# Patient Record
Sex: Female | Born: 1985 | Race: White | Hispanic: No | Marital: Married | Smoking: Former smoker
Health system: Southern US, Community
[De-identification: ages and names within clinical notes are randomized; demographics above are authoritative.]

## PROBLEM LIST (undated history)

## (undated) ENCOUNTER — Ambulatory Visit: Admission: EM | Payer: Managed Care, Other (non HMO) | Source: Home / Self Care

## (undated) ENCOUNTER — Inpatient Hospital Stay (HOSPITAL_COMMUNITY): Payer: Self-pay

## (undated) DIAGNOSIS — K589 Irritable bowel syndrome without diarrhea: Secondary | ICD-10-CM

## (undated) DIAGNOSIS — Z8659 Personal history of other mental and behavioral disorders: Secondary | ICD-10-CM

## (undated) DIAGNOSIS — Z8619 Personal history of other infectious and parasitic diseases: Secondary | ICD-10-CM

## (undated) DIAGNOSIS — D6800 Von Willebrand disease, unspecified: Secondary | ICD-10-CM

## (undated) DIAGNOSIS — L309 Dermatitis, unspecified: Secondary | ICD-10-CM

## (undated) DIAGNOSIS — F329 Major depressive disorder, single episode, unspecified: Secondary | ICD-10-CM

## (undated) DIAGNOSIS — J45909 Unspecified asthma, uncomplicated: Secondary | ICD-10-CM

## (undated) DIAGNOSIS — F32A Depression, unspecified: Secondary | ICD-10-CM

## (undated) DIAGNOSIS — F419 Anxiety disorder, unspecified: Secondary | ICD-10-CM

## (undated) DIAGNOSIS — M419 Scoliosis, unspecified: Secondary | ICD-10-CM

## (undated) DIAGNOSIS — O0990 Supervision of high risk pregnancy, unspecified, unspecified trimester: Secondary | ICD-10-CM

## (undated) HISTORY — DX: Dermatitis, unspecified: L30.9

## (undated) HISTORY — DX: Anxiety disorder, unspecified: F41.9

## (undated) HISTORY — PX: WISDOM TOOTH EXTRACTION: SHX21

## (undated) HISTORY — DX: Supervision of high risk pregnancy, unspecified, unspecified trimester: O09.90

## (undated) HISTORY — PX: UPPER GASTROINTESTINAL ENDOSCOPY: SHX188

## (undated) HISTORY — DX: Personal history of other mental and behavioral disorders: Z86.59

## (undated) HISTORY — DX: Irritable bowel syndrome, unspecified: K58.9

## (undated) HISTORY — DX: Personal history of other infectious and parasitic diseases: Z86.19

## (undated) HISTORY — DX: Major depressive disorder, single episode, unspecified: F32.9

## (undated) HISTORY — PX: COLONOSCOPY: SHX174

## (undated) HISTORY — DX: Depression, unspecified: F32.A

## (undated) HISTORY — DX: Scoliosis, unspecified: M41.9

---
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
## 2006-07-28 ENCOUNTER — Encounter: Admission: RE | Admit: 2006-07-28 | Discharge: 2006-07-28 | Payer: Self-pay | Admitting: Obstetrics and Gynecology

## 2006-12-18 ENCOUNTER — Emergency Department (HOSPITAL_COMMUNITY): Admission: EM | Admit: 2006-12-18 | Discharge: 2006-12-18 | Payer: Self-pay | Admitting: Emergency Medicine

## 2010-01-29 ENCOUNTER — Ambulatory Visit: Payer: Self-pay | Admitting: Hematology and Oncology

## 2010-02-03 LAB — CBC WITH DIFFERENTIAL/PLATELET
BASO%: 0.4 % (ref 0.0–2.0)
EOS%: 1.8 % (ref 0.0–7.0)
HCT: 39.5 % (ref 34.8–46.6)
MCHC: 34.2 g/dL (ref 31.5–36.0)
MONO#: 0.5 10*3/uL (ref 0.1–0.9)
RBC: 4.57 10*6/uL (ref 3.70–5.45)
RDW: 15.2 % — ABNORMAL HIGH (ref 11.2–14.5)
WBC: 8.1 10*3/uL (ref 3.9–10.3)
lymph#: 2.1 10*3/uL (ref 0.9–3.3)

## 2010-02-03 LAB — PROTIME-INR
INR: 1 — ABNORMAL LOW (ref 2.00–3.50)
Protime: 12 Seconds (ref 10.6–13.4)

## 2010-02-03 LAB — IVY BLEEDING TIME: Bleeding Time: 6.5 Minutes (ref 2.0–8.0)

## 2010-02-10 LAB — COMPREHENSIVE METABOLIC PANEL
AST: 14 U/L (ref 0–37)
Alkaline Phosphatase: 42 U/L (ref 39–117)
BUN: 10 mg/dL (ref 6–23)
Creatinine, Ser: 0.41 mg/dL (ref 0.40–1.20)
Glucose, Bld: 72 mg/dL (ref 70–99)

## 2010-02-10 LAB — VON WILLEBRAND FACTOR MULTIMER
Factor-VIII Activity: 82 % (ref 50–180)
Ristocetin Co-Factor: 91 % (ref 42–200)
Von Willebrand Factor Ag: 100 % (ref 50–217)

## 2010-05-28 ENCOUNTER — Ambulatory Visit: Payer: Self-pay | Admitting: Hematology and Oncology

## 2010-06-03 LAB — VON WILLEBRAND PANEL
Factor-VIII Activity: 58 % (ref 50–150)
Ristocetin-Cofactor: 21 % — ABNORMAL LOW (ref 50–150)
Von Willebrand Ag: 49 % normal — ABNORMAL LOW (ref 61–164)

## 2010-07-28 ENCOUNTER — Ambulatory Visit: Payer: Self-pay | Admitting: Hematology and Oncology

## 2010-07-30 LAB — CBC WITH DIFFERENTIAL/PLATELET
BASO%: 0.6 % (ref 0.0–2.0)
EOS%: 1.1 % (ref 0.0–7.0)
LYMPH%: 23.2 % (ref 14.0–49.7)
MCH: 31.8 pg (ref 25.1–34.0)
MCHC: 35.1 g/dL (ref 31.5–36.0)
MONO#: 0.6 10*3/uL (ref 0.1–0.9)
Platelets: 230 10*3/uL (ref 145–400)
RBC: 4.09 10*6/uL (ref 3.70–5.45)
WBC: 9.1 10*3/uL (ref 3.9–10.3)
lymph#: 2.1 10*3/uL (ref 0.9–3.3)

## 2010-08-04 LAB — VON WILLEBRAND FACTOR MULTIMER: Factor-VIII Activity: 131 % (ref 50–180)

## 2010-08-14 LAB — VON WILLEBRAND PANEL: Ristocetin-Cofactor: 124 % (ref 50–150)

## 2010-08-19 ENCOUNTER — Inpatient Hospital Stay (HOSPITAL_COMMUNITY): Admission: RE | Admit: 2010-08-19 | Discharge: 2010-08-21 | Payer: Self-pay | Admitting: Obstetrics & Gynecology

## 2010-11-17 ENCOUNTER — Ambulatory Visit: Payer: Self-pay | Admitting: Hematology and Oncology

## 2011-03-04 LAB — CBC
HCT: 36.7 % (ref 36.0–46.0)
MCHC: 34.6 g/dL (ref 30.0–36.0)
MCV: 94 fL (ref 78.0–100.0)
Platelets: 191 10*3/uL (ref 150–400)
RDW: 13.5 % (ref 11.5–15.5)
WBC: 13.4 10*3/uL — ABNORMAL HIGH (ref 4.0–10.5)

## 2011-03-05 LAB — TYPE AND SCREEN

## 2011-03-05 LAB — ABO/RH: ABO/RH(D): O POS

## 2011-03-05 LAB — RPR: RPR Ser Ql: NONREACTIVE

## 2011-03-05 LAB — CBC
Hemoglobin: 13.4 g/dL (ref 12.0–15.0)
MCH: 32.1 pg (ref 26.0–34.0)
MCHC: 34.2 g/dL (ref 30.0–36.0)
Platelets: 235 10*3/uL (ref 150–400)
RBC: 4.18 MIL/uL (ref 3.87–5.11)

## 2012-08-30 ENCOUNTER — Encounter: Payer: Self-pay | Admitting: Hematology and Oncology

## 2012-08-30 NOTE — Progress Notes (Signed)
Faxed clinical information to [employer] for patient's life insurance policy attnMariel Craft @ [phone].

## 2015-01-21 LAB — HM PAP SMEAR

## 2015-07-02 LAB — OB RESULTS CONSOLE GC/CHLAMYDIA
Chlamydia: NEGATIVE
Gonorrhea: NEGATIVE

## 2015-07-02 LAB — OB RESULTS CONSOLE ABO/RH: RH TYPE: POSITIVE

## 2015-07-02 LAB — OB RESULTS CONSOLE RUBELLA ANTIBODY, IGM: Rubella: IMMUNE

## 2015-07-02 LAB — OB RESULTS CONSOLE HEPATITIS B SURFACE ANTIGEN: HEP B S AG: NEGATIVE

## 2015-07-02 LAB — OB RESULTS CONSOLE RPR: RPR: NONREACTIVE

## 2015-07-02 LAB — OB RESULTS CONSOLE HIV ANTIBODY (ROUTINE TESTING): HIV: NONREACTIVE

## 2015-07-11 ENCOUNTER — Inpatient Hospital Stay (HOSPITAL_COMMUNITY)
Admission: AD | Admit: 2015-07-11 | Discharge: 2015-07-12 | Disposition: A | Discharge status: Home / Self Care | Payer: BLUE CROSS/BLUE SHIELD | Source: Ambulatory Visit | Attending: Obstetrics and Gynecology | Admitting: Obstetrics and Gynecology

## 2015-07-11 DIAGNOSIS — D6801 Von willebrand disease, type 1: Secondary | ICD-10-CM | POA: Diagnosis present

## 2015-07-11 DIAGNOSIS — Z3A01 Less than 8 weeks gestation of pregnancy: Secondary | ICD-10-CM | POA: Insufficient documentation

## 2015-07-11 DIAGNOSIS — J45909 Unspecified asthma, uncomplicated: Secondary | ICD-10-CM

## 2015-07-11 DIAGNOSIS — Z886 Allergy status to analgesic agent status: Secondary | ICD-10-CM | POA: Insufficient documentation

## 2015-07-11 DIAGNOSIS — O26899 Other specified pregnancy related conditions, unspecified trimester: Secondary | ICD-10-CM

## 2015-07-11 DIAGNOSIS — O26851 Spotting complicating pregnancy, first trimester: Secondary | ICD-10-CM

## 2015-07-11 DIAGNOSIS — Z889 Allergy status to unspecified drugs, medicaments and biological substances status: Secondary | ICD-10-CM

## 2015-07-11 DIAGNOSIS — O99111 Other diseases of the blood and blood-forming organs and certain disorders involving the immune mechanism complicating pregnancy, first trimester: Secondary | ICD-10-CM | POA: Insufficient documentation

## 2015-07-11 DIAGNOSIS — R109 Unspecified abdominal pain: Secondary | ICD-10-CM

## 2015-07-11 DIAGNOSIS — Z87891 Personal history of nicotine dependence: Secondary | ICD-10-CM | POA: Insufficient documentation

## 2015-07-11 DIAGNOSIS — D680 Von Willebrand's disease: Secondary | ICD-10-CM | POA: Insufficient documentation

## 2015-07-11 DIAGNOSIS — Z888 Allergy status to other drugs, medicaments and biological substances status: Secondary | ICD-10-CM

## 2015-07-11 DIAGNOSIS — O209 Hemorrhage in early pregnancy, unspecified: Secondary | ICD-10-CM

## 2015-07-11 HISTORY — DX: Von Willebrand disease, unspecified: D68.00

## 2015-07-11 HISTORY — DX: Unspecified asthma, uncomplicated: J45.909

## 2015-07-11 HISTORY — DX: Von Willebrand's disease: D68.0

## 2015-07-12 ENCOUNTER — Inpatient Hospital Stay (HOSPITAL_COMMUNITY): Payer: BLUE CROSS/BLUE SHIELD

## 2015-07-12 ENCOUNTER — Encounter (HOSPITAL_COMMUNITY): Payer: Self-pay | Admitting: *Deleted

## 2015-07-12 DIAGNOSIS — Z886 Allergy status to analgesic agent status: Secondary | ICD-10-CM | POA: Diagnosis not present

## 2015-07-12 DIAGNOSIS — O99111 Other diseases of the blood and blood-forming organs and certain disorders involving the immune mechanism complicating pregnancy, first trimester: Secondary | ICD-10-CM | POA: Diagnosis not present

## 2015-07-12 DIAGNOSIS — O26851 Spotting complicating pregnancy, first trimester: Secondary | ICD-10-CM | POA: Diagnosis not present

## 2015-07-12 DIAGNOSIS — Z3A01 Less than 8 weeks gestation of pregnancy: Secondary | ICD-10-CM | POA: Diagnosis not present

## 2015-07-12 DIAGNOSIS — Z87891 Personal history of nicotine dependence: Secondary | ICD-10-CM | POA: Diagnosis not present

## 2015-07-12 DIAGNOSIS — D680 Von Willebrand's disease: Secondary | ICD-10-CM | POA: Diagnosis not present

## 2015-07-12 LAB — WET PREP, GENITAL
Trich, Wet Prep: NONE SEEN
Yeast Wet Prep HPF POC: NONE SEEN

## 2015-07-12 LAB — URINALYSIS, ROUTINE W REFLEX MICROSCOPIC
BILIRUBIN URINE: NEGATIVE
GLUCOSE, UA: NEGATIVE mg/dL
KETONES UR: NEGATIVE mg/dL
LEUKOCYTES UA: NEGATIVE
Nitrite: NEGATIVE
PH: 5.5 (ref 5.0–8.0)
PROTEIN: NEGATIVE mg/dL
Specific Gravity, Urine: >1.03 — ABNORMAL HIGH (ref 1.005–1.030)
Urobilinogen, UA: 0.2 mg/dL (ref 0.0–1.0)

## 2015-07-12 LAB — URINE MICROSCOPIC-ADD ON

## 2015-07-12 NOTE — MAU Provider Note (Signed)
History  29 yo G2P1001 @ 6.4 wks by LMP congruent w/ sono yesterday presents to MAU w/ c/o pinkish spotting in toilet and when wiping. +Cramping. H/O Von Willebrand disease.  Patient Active Problem List   Diagnosis Date Noted  . Spotting affecting pregnancy in first trimester 07/11/2015  . Cramping affecting pregnancy, antepartum 07/11/2015  . Von Willebrand disease 07/11/2015  . Aspirin allergy 07/11/2015    Chief Complaint  Patient presents with  . Vaginal Bleeding  . Abdominal Cramping   HPI As above OB History    Gravida Para Term Preterm AB TAB SAB Ectopic Multiple Living   2 1 1       1       Past Medical History  Diagnosis Date  . Von Willebrand disease   . Asthma     Past Surgical History  Procedure Laterality Date  . Wisdom tooth extraction      No family history on file.  History  Substance Use Topics  . Smoking status: Former Smoker    Quit date: 05/12/2015  . Smokeless tobacco: Not on file  . Alcohol Use: No     Comment: not while pregnant    Allergies: Allergies not on file  No prescriptions prior to admission    ROS  Spotting Cramping Physical Exam   Blood pressure 110/60, pulse 70, temperature 98.2 F (36.8 C), resp. rate 18, height 5\' 7"  (1.702 m), weight 60.238 kg (132 lb 12.8 oz), last menstrual period 05/22/2015.  Recent Results (from the past 2160 hour(s))  GC/Chlamydia probe amp ([place])not at [facility]     Status: None   Collection Time: 07/12/15 12:00 AM  Result Value Ref Range   Chlamydia Negative     Comment: Normal Reference Range - Negative   Neisseria gonorrhea Negative     Comment: Normal Reference Range - Negative  Urinalysis, Routine w reflex microscopic (not at [facility])     Status: Abnormal   Collection Time: 07/12/15 12:45 AM  Result Value Ref Range   Color, Urine YELLOW YELLOW   APPearance CLEAR CLEAR   Specific Gravity, Urine >1.030 (H) 1.005 - 1.030   pH 5.5 5.0 - 8.0   Glucose, UA NEGATIVE NEGATIVE mg/dL   Hgb urine dipstick LARGE (A) NEGATIVE   Bilirubin Urine NEGATIVE NEGATIVE   Ketones, ur NEGATIVE NEGATIVE mg/dL   Protein, ur NEGATIVE NEGATIVE mg/dL   Urobilinogen, UA 0.2 0.0 - 1.0 mg/dL   Nitrite NEGATIVE NEGATIVE   Leukocytes, UA NEGATIVE NEGATIVE  Urine microscopic-add on     Status: None   Collection Time: 07/12/15 12:45 AM  Result Value Ref Range   Squamous Epithelial / LPF RARE RARE   WBC, UA 0-2 <3 WBC/hpf   RBC / HPF 3-6 <3 RBC/hpf   Bacteria, UA RARE RARE   Urine-Other MUCOUS PRESENT   Wet prep, genital     Status: Abnormal   Collection Time: 07/12/15  2:43 AM  Result Value Ref Range   Yeast Wet Prep HPF POC NONE SEEN NONE SEEN   Trich, Wet Prep NONE SEEN NONE SEEN   Clue Cells Wet Prep HPF POC RARE (A) NONE SEEN   WBC, Wet Prep HPF POC MODERATE (A) NONE SEEN    Comment: MODERATE BACTERIA SEEN  CLINICAL DATA: 29 year old pregnant female with vaginal bleeding  EXAM: OBSTETRIC <14 WK [place] AND TRANSVAGINAL OB US  TECHNIQUE: Both transabdominal and transvaginal ultrasound examinations were performed for complete evaluation of the gestation as well as the maternal uterus, adnexal  regions, and pelvic cul-de-sac. Transvaginal technique was performed to assess early pregnancy.  COMPARISON: None.  FINDINGS: Intrauterine gestational sac: Present  Yolk sac: Seen  Embryo: Seen  Cardiac Activity: Detected  Heart Rate: 125 bpm  CRL: 7 mm 6 w 5 d [place] EDC: 03/01/2016  Maternal uterus/adnexae: A 2.8 x 1.5 x 2.1 cm subchorionic hemorrhage is noted. Follow-up recommended. The ovaries appear unremarkable. Corpus luteum is noted in the right ovary.  IMPRESSION: Single live intrauterine pregnancy with an estimated gestational age of [redacted] weeks, 5 days.   Electronically Signed  By: Anner Crete M.D.  On: 07/12/2015 02:47  Physical Exam Gen: Mild distress Abdomen: gravid, soft, NT Pelvic: Cvx visually closed. Brown discharge  noted in vaginal vault. Sample collected for wet prep and GC/CT Ext: WNL ED Course  Assessment: IUP at 6.4 wks Spotting H/O Von Willebrand disease  Plan: [place] results as above. Reviewed small [place]. Advised to avoid intercourse & strenuous activity. Reasurrance. OB f/u as scheduled. BV noted on wet prep after pt d/c'd. Called pt to discuss results - voicemail not set up. Will have office contact pt and Rx Clindamycin 300 mg po bid x 7 days.   Farrel Gordon CNM, MS 07/12/15, 02:54 AM

## 2015-07-12 NOTE — Discharge Instructions (Signed)

## 2015-07-12 NOTE — MAU Note (Signed)
Had transvaginal u/s about 1000 Friday. Yesterday afternoon when urinated had some pink in toilet and everytime I wipe I see pink or brown on tissue. U/S showed [redacted]w[redacted]d IUP per pt. Some abd cramping

## 2015-07-14 LAB — GC/CHLAMYDIA PROBE AMP (~~LOC~~) NOT AT ARMC
CHLAMYDIA, DNA PROBE: NEGATIVE
NEISSERIA GONORRHEA: NEGATIVE

## 2015-08-18 ENCOUNTER — Other Ambulatory Visit (HOSPITAL_COMMUNITY): Payer: Self-pay | Admitting: Obstetrics and Gynecology

## 2015-08-18 DIAGNOSIS — Z3A13 13 weeks gestation of pregnancy: Secondary | ICD-10-CM

## 2015-08-18 DIAGNOSIS — Z3682 Encounter for antenatal screening for nuchal translucency: Secondary | ICD-10-CM

## 2015-08-22 ENCOUNTER — Ambulatory Visit (HOSPITAL_COMMUNITY): Payer: BLUE CROSS/BLUE SHIELD

## 2015-08-22 ENCOUNTER — Encounter (HOSPITAL_COMMUNITY): Payer: BLUE CROSS/BLUE SHIELD

## 2015-08-22 ENCOUNTER — Ambulatory Visit (HOSPITAL_COMMUNITY): Admission: RE | Admit: 2015-08-22 | Payer: BLUE CROSS/BLUE SHIELD | Source: Ambulatory Visit

## 2015-08-26 ENCOUNTER — Ambulatory Visit (HOSPITAL_COMMUNITY): Payer: BLUE CROSS/BLUE SHIELD

## 2015-09-09 ENCOUNTER — Encounter (HOSPITAL_COMMUNITY): Payer: BLUE CROSS/BLUE SHIELD

## 2015-09-09 ENCOUNTER — Inpatient Hospital Stay (HOSPITAL_COMMUNITY): Admission: RE | Admit: 2015-09-09 | Payer: BLUE CROSS/BLUE SHIELD | Source: Ambulatory Visit

## 2015-10-27 ENCOUNTER — Encounter: Payer: Self-pay | Admitting: Oncology

## 2015-10-27 ENCOUNTER — Ambulatory Visit (INDEPENDENT_AMBULATORY_CARE_PROVIDER_SITE_OTHER): Payer: BLUE CROSS/BLUE SHIELD | Admitting: Oncology

## 2015-10-27 VITALS — BP 121/66 | HR 84 | Temp 97.7°F | Ht 65.5 in | Wt 149.2 lb

## 2015-10-27 DIAGNOSIS — O0992 Supervision of high risk pregnancy, unspecified, second trimester: Secondary | ICD-10-CM

## 2015-10-27 DIAGNOSIS — O0990 Supervision of high risk pregnancy, unspecified, unspecified trimester: Secondary | ICD-10-CM | POA: Insufficient documentation

## 2015-10-27 DIAGNOSIS — D6800 Von Willebrand disease, unspecified: Secondary | ICD-10-CM

## 2015-10-27 HISTORY — DX: Supervision of high risk pregnancy, unspecified, unspecified trimester: O09.90

## 2015-10-27 NOTE — Progress Notes (Signed)
Patient ID: Kendra Patton, female   DOB: [birth year], 29 y.o.   MRN: [id number] New Patient Hematology   Kendra Patton [id number] 10-22-1986 29 y.o. 10/27/2015  CC: Dr Katharine Look Rivard   Reason for referral: Type I von Willebrand's disorder now in second pregnancy   HPI:  Pleasant 29 year old woman diagnosed with type I von Willebrand's disorder around the time of menarche, age 83, when she presented with menorrhagia. Disease has been quite mild. She received DDAVP to cover wisdom teeth extractions. She has had no other surgeries. She has never had a blood transfusion. She has never required clotting factor concentrates. She has type O+ blood. She does get frequent gum bleeding. Menstrual periods are intermittently heavy. She bruises easily. Both Kendra Kendra Patton and Kendra maternal grandmother have had problems with bleeding. Kendra Patton now 29 years old. Newly one of these women were ever formally tested for VW. Both required hysterectomies. No bleeding on Kendra father's side of the family. She has 2 sisters aged 29 and 43. The 17 year old was tested and is negative. She has had 2 uneventful pregnancies. The 28 year old has not been tested but has not had any clinical bleeding problems. She had a vaginal delivery with Kendra first child born 08/19/2010. She had no bleeding problems at time of delivery. No delayed bleeding. Unfortunately, Kendra Patton was diagnosed with foam Willebrand's disorder. 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 Kendra Patton has received Humate-P and Amicar to cover surgical procedures. She is now 22-1/[redacted] weeks pregnant with due date 02/26/2016. Laboratory studies done during Kendra first pregnancy:                               01/24/2010                     06/01/2010                             07/30/2010 Factor VIII activity       82                                  58                                            131 V W  antigen                 100                                49                                             148 Ristocetin                      91                                21  135  Normal multimer pattern tested 2  The June values are discrepant . Von Willebrand factors uniformly elevate during pregnancy and the second trimester value should not be lower than the third trimester values.                PMH: Past Medical History  Diagnosis Date  . Von Willebrand disease ([place])   . Asthma   . High-risk pregnancy supervision 10/27/2015    Past Surgical History  Procedure Laterality Date  . Wisdom tooth extraction      Allergies: Allergies  Allergen Reactions  . Compazine [Prochlorperazine Edisylate]     Dystonic.    Medications:    Social History:   reports that she quit smoking about 5 months ago. She does not have any smokeless tobacco history on file. She reports that she does not drink alcohol or use illicit drugs.  Family History: See HPI  Review of Systems: See HPI Remaining ROS negative.  Physical Exam: Blood pressure 121/66, pulse 84, temperature 97.7 F (36.5 C), temperature source Oral, height 5' 5.5" (1.664 m), weight 149 lb 3.2 oz (67.677 kg), last menstrual period 05/22/2015, SpO2 100 %. Wt Readings from Last 3 Encounters:  10/27/15 149 lb 3.2 oz (67.677 kg)  07/12/15 132 lb 12.8 oz (60.238 kg)     General appearance: well nourished Caucasian woman HENNT: Pharynx no erythema, exudate, mass, or ulcer. No thyromegaly or thyroid nodules Lymph nodes: No cervical, supraclavicular, or axillary lymphadenopathy Breasts:  Lungs: Clear to auscultation, resonant to percussion throughout Heart: Regular rhythm, no murmur, no gallop, no rub, no click, no edema Abdomen: Soft, nontender,second trimester pregnancy  Extremities: No edema, no calf tenderness Musculoskeletal: no joint deformities GU:  Vascular:   Neurologic: Alert, oriented, PERRLA, optic discs sharp and vessels normal, no hemorrhage or exudate, cranial nerves grossly normal, motor strength 5 over 5, reflexes 1+ symmetric, upper body coordination normal, gait normal, Skin: No rash or ecchymosis    Lab Results: Lab Results  Component Value Date   WBC 13.4* 08/20/2010   HGB 12.7 08/20/2010   HCT 36.7 08/20/2010   MCV 94.0 08/20/2010   PLT 191 08/20/2010     Chemistry      Component Value Date/Time   NA 135 02/03/2010 1429   K 4.6 02/03/2010 1429   CL 103 02/03/2010 1429   CO2 20 02/03/2010 1429   BUN 10 02/03/2010 1429   CREATININE 0.41 02/03/2010 1429      Component Value Date/Time   CALCIUM 8.9 02/03/2010 1429   ALKPHOS 42 02/03/2010 1429   AST 14 02/03/2010 1429   ALT 12 02/03/2010 1429   BILITOT 0.3 02/03/2010 1429        Impression and Plan: Type I von Willebrand's disorder  No excessive bleeding at time of delivery of Kendra first child. We discussed a strategy for the upcoming delivery.  We can use DDAVP 0.3 mcg/kg in 100 mL normal saline IV over 20 minutes to be given 1 hour prior to an anticipated cesarean section, if C section indicated,  then Q 24 hours  and only use as needed if excessive bleeding occurs at time of a vaginal delivery. Fluid restriction if DDAVP is used to avoid hyponatremia. I would have available in the hospital pharmacy humate P concentrated clotting factor to be given at a dose of 40-50 units per kilogram IV over 15 minutes loading dose then 20-40 units/kg every 12-24 hours for 3 to 5 days.  hours for bleeding uncontrolled by  DDAVP.   I will see Kendra again in the third trimester to repeat von Willebrand's factor levels to help guide advice on use of clotting factors if needed.    Annia Belt, MD 10/27/2015, 2:47 PM

## 2015-10-27 NOTE — Patient Instructions (Signed)
Lab here 01/27/16 MD visit 1-2 weeks after lab but must be before 3/9

## 2015-12-21 NOTE — L&D Delivery Note (Signed)
Delivery Note At 4:11 AM a viable female was delivered via Vaginal, Spontaneous Delivery (Presentation: Left Occiput Anterior).  APGAR: 9, 9; weight 6 lb 15.3 oz (3155 g).   Placenta status: Intact, Spontaneous.  Cord: 3 vessels with the following complications: None.   Anesthesia: Epidural  Episiotomy: None Lacerations: None Suture Repair: none Est. Blood Loss (mL): 100  Mom to postpartum.  Baby to Couplet care / Skin to Skin.  Janyth Pupa, M 02/29/2016, 9:16 AM

## 2016-01-27 ENCOUNTER — Other Ambulatory Visit (INDEPENDENT_AMBULATORY_CARE_PROVIDER_SITE_OTHER): Payer: BLUE CROSS/BLUE SHIELD

## 2016-01-27 DIAGNOSIS — D6800 Von Willebrand disease, unspecified: Secondary | ICD-10-CM

## 2016-01-27 LAB — OB RESULTS CONSOLE GBS: STREP GROUP B AG: NEGATIVE

## 2016-01-27 LAB — APTT: aPTT: 26 seconds (ref 24–37)

## 2016-01-28 LAB — CBC WITH DIFFERENTIAL/PLATELET
BASOS: 0 %
Basophils Absolute: 0 10*3/uL (ref 0.0–0.2)
EOS (ABSOLUTE): 0.1 10*3/uL (ref 0.0–0.4)
EOS: 2 %
HEMATOCRIT: 35 % (ref 34.0–46.6)
Hemoglobin: 12.1 g/dL (ref 11.1–15.9)
IMMATURE GRANS (ABS): 0.1 10*3/uL (ref 0.0–0.1)
IMMATURE GRANULOCYTES: 1 %
LYMPHS: 23 %
Lymphocytes Absolute: 1.9 10*3/uL (ref 0.7–3.1)
MCH: 30.3 pg (ref 26.6–33.0)
MCHC: 34.6 g/dL (ref 31.5–35.7)
MCV: 88 fL (ref 79–97)
MONOS ABS: 0.6 10*3/uL (ref 0.1–0.9)
Monocytes: 7 %
NEUTROS ABS: 5.5 10*3/uL (ref 1.4–7.0)
NEUTROS PCT: 67 %
Platelets: 230 10*3/uL (ref 150–379)
RBC: 3.99 x10E6/uL (ref 3.77–5.28)
RDW: 13.6 % (ref 12.3–15.4)
WBC: 8.2 10*3/uL (ref 3.4–10.8)

## 2016-01-29 LAB — COAG STUDIES INTERP REPORT: PDF IMAGE: 0

## 2016-01-29 LAB — VON WILLEBRAND PANEL
FACTOR VIII ACTIVITY: 166 % — AB (ref 57–163)
VON WILLEBRAND AG: 136 % (ref 50–200)
Von Willebrand Factor: 110 % (ref 50–200)

## 2016-02-06 ENCOUNTER — Telehealth: Payer: Self-pay | Admitting: *Deleted

## 2016-02-06 NOTE — Telephone Encounter (Signed)
-----   Message from Annia Belt, MD sent at 02/05/2016  9:29 AM EST ----- Call pt: Von Willebrand levels are very good

## 2016-02-06 NOTE — Telephone Encounter (Signed)
Pt called / informed von Willebrand levels are very good per Dr Beryle Beams.

## 2016-02-10 ENCOUNTER — Encounter: Payer: Self-pay | Admitting: Oncology

## 2016-02-10 ENCOUNTER — Ambulatory Visit (INDEPENDENT_AMBULATORY_CARE_PROVIDER_SITE_OTHER): Payer: BLUE CROSS/BLUE SHIELD | Admitting: Oncology

## 2016-02-10 VITALS — BP 105/82 | HR 70 | Temp 97.7°F | Ht 65.5 in | Wt 165.0 lb

## 2016-02-10 DIAGNOSIS — O99113 Other diseases of the blood and blood-forming organs and certain disorders involving the immune mechanism complicating pregnancy, third trimester: Secondary | ICD-10-CM

## 2016-02-10 DIAGNOSIS — D6800 Von Willebrand disease, unspecified: Secondary | ICD-10-CM

## 2016-02-10 DIAGNOSIS — Z3A00 Weeks of gestation of pregnancy not specified: Secondary | ICD-10-CM | POA: Diagnosis not present

## 2016-02-10 DIAGNOSIS — O0990 Supervision of high risk pregnancy, unspecified, unspecified trimester: Secondary | ICD-10-CM

## 2016-02-10 NOTE — Patient Instructions (Signed)
Return visit 03/02/16

## 2016-02-10 NOTE — Progress Notes (Signed)
Patient ID: Kendra Patton, female   DOB: 03/12/1986, 30 y.o.   MRN: [id number] Hematology and Oncology Follow Up Visit  Kendra Patton [id number] 03-13-1986 30 y.o. 02/10/2016 10:11 AM   Principle Diagnosis: Encounter Diagnoses  Name Primary?  Kendra Lederer Willebrand disease ([place]) Yes  . High-risk pregnancy supervision, unspecified trimester      Interim History:  30 year old woman, known type I von Willebrand's disorder, now near-term of her second pregnancy with due date 02/26/2016. She had no complications with the first pregnancy. Vaginal delivery. No postpartum bleeding. She does remember developing a large bruise at the site of the epidural catheter. I checked factor levels on February 7 and they are all elevated as expected with factor VIII activity 166%, von Willebrand antigen 136%, and ristocetin cofactor activity 110% of control. I do not anticipate any issues if she needs an epidural again. Levels typically don't start to fall until after delivery.  Medications: reviewed  Allergies:  Allergies  Allergen Reactions  . Compazine [Prochlorperazine Edisylate]     Dystonic.     Physical Exam: Blood pressure 105/82, pulse 70, temperature 97.7 F (36.5 C), temperature source Oral, height 5' 5.5" (1.664 m), weight 165 lb (74.844 kg), last menstrual period 05/22/2015, SpO2 98 %. Wt Readings from Last 3 Encounters:  02/10/16 165 lb (74.844 kg)  10/27/15 149 lb 3.2 oz (67.677 kg)  07/12/15 132 lb 12.8 oz (60.238 kg)     General appearance: well nourished Caucasian woman HENNT: Pharynx no erythema, exudate, mass, or ulcer. No thyromegaly or thyroid nodules Lymph nodes: No cervical, supraclavicular, or axillary lymphadenopathy Breasts:  Lungs: Clear to auscultation, resonant to percussion throughout Heart: Regular rhythm, no murmur, no gallop, no rub, no click, no edema Abdomen:near term pregnancy Extremities: No edema, no calf tenderness Musculoskeletal: no joint deformities GU:   Vascular: Carotid pulses 2+, Neurologic: Alert, oriented, PERRLA, optic discs sharp and vessels normal, no hemorrhage or exudate, cranial nerves grossly normal, motor strength 5 over 5, reflexes 1+ symmetric, upper body coordination normal, gait normal, Skin: No rash or ecchymosis  Lab Results: CBC W/Diff    Component Value Date/Time   WBC 8.2 01/27/2016 1354   WBC 13.4* 08/20/2010 0550   WBC 9.1 07/30/2010 1408   RBC 3.99 01/27/2016 1354   RBC 3.91 08/20/2010 0550   RBC 4.09 07/30/2010 1408   HGB 12.7 08/20/2010 0550   HGB 13.0 07/30/2010 1408   HCT 35.0 01/27/2016 1354   HCT 36.7 08/20/2010 0550   HCT 37.2 07/30/2010 1408   PLT 230 01/27/2016 1354   PLT 191 08/20/2010 0550   PLT 230 07/30/2010 1408   MCV 88 01/27/2016 1354   MCV 94.0 08/20/2010 0550   MCV 90.8 07/30/2010 1408   MCH 30.3 01/27/2016 1354   MCH 32.5 08/20/2010 0550   MCH 31.8 07/30/2010 1408   MCHC 34.6 01/27/2016 1354   MCHC 34.6 08/20/2010 0550   MCHC 35.1 07/30/2010 1408   RDW 13.6 01/27/2016 1354   RDW 13.5 08/20/2010 0550   RDW 13.3 07/30/2010 1408   LYMPHSABS 1.9 01/27/2016 1354   LYMPHSABS 2.1 07/30/2010 1408   MONOABS 0.6 07/30/2010 1408   EOSABS 0.1 01/27/2016 1354   EOSABS 0.1 07/30/2010 1408   BASOSABS 0.0 01/27/2016 1354   BASOSABS 0.1 07/30/2010 1408     Chemistry      Component Value Date/Time   NA 135 02/03/2010 1429   K 4.6 02/03/2010 1429   CL 103 02/03/2010 1429   CO2 20 02/03/2010  1429   BUN 10 02/03/2010 1429   CREATININE 0.41 02/03/2010 1429      Component Value Date/Time   CALCIUM 8.9 02/03/2010 1429   ALKPHOS 42 02/03/2010 1429   AST 14 02/03/2010 1429   ALT 12 02/03/2010 1429   BILITOT 0.3 02/03/2010 1429     Impression:  Type I von Willebrand's disorder near term second pregnancy  Factor levels checked in third trimester are elevated as expected with pregnancy. She is clear for vaginal delivery and use of epidural anesthesia. Monitor closely postpartum. If any  increased bleeding encountered then treated with DDAVP 0.3 mcg/kg IV in 100 mL normal saline over 20 minutes using pre-pregnant weight of 60 kg total dose 18 g. This dose can be repeated at 12 hours and then at 24 hour intervals. If she does receive the drug, thin fluids should be restricted to avoid hyponatremia from the antidiuretic effects of this hormone. Should she require a cesarean section, she should be pretreated one hour prior to surgery with Humate-P 60 units per kilogram, total dose 3600 mg with maintenance doses every 8-12 hours over the next 72 hours based on factor VIII levels. I will alert main pharmacy at [facility] to have product available on-call for emergencies at [facility]  I can be reached 24/7 on my cell phone if any advice is needed or if unanticipated problems arise: [phone]   CC: Dr. Katharine Look Rivard   Annia Belt, MD 2/21/201710:11 AM

## 2016-02-27 ENCOUNTER — Encounter (HOSPITAL_COMMUNITY): Payer: Self-pay | Admitting: *Deleted

## 2016-02-27 ENCOUNTER — Telehealth (HOSPITAL_COMMUNITY): Payer: Self-pay | Admitting: *Deleted

## 2016-02-27 ENCOUNTER — Other Ambulatory Visit: Payer: Self-pay | Admitting: Obstetrics & Gynecology

## 2016-02-27 DIAGNOSIS — Z889 Allergy status to unspecified drugs, medicaments and biological substances status: Secondary | ICD-10-CM

## 2016-02-27 NOTE — Telephone Encounter (Signed)
Preadmission screen  

## 2016-02-28 ENCOUNTER — Inpatient Hospital Stay (HOSPITAL_COMMUNITY)
Admission: EM | Admit: 2016-02-28 | Discharge: 2016-03-01 | DRG: 775 | Disposition: A | Discharge status: Home / Self Care | Payer: BLUE CROSS/BLUE SHIELD | Source: Ambulatory Visit | Attending: Obstetrics & Gynecology | Admitting: Obstetrics & Gynecology

## 2016-02-28 ENCOUNTER — Encounter (HOSPITAL_COMMUNITY): Payer: Self-pay | Admitting: *Deleted

## 2016-02-28 ENCOUNTER — Inpatient Hospital Stay (HOSPITAL_COMMUNITY)
Admission: RE | Admit: 2016-02-28 | Payer: BLUE CROSS/BLUE SHIELD | Source: Ambulatory Visit | Admitting: Obstetrics and Gynecology

## 2016-02-28 DIAGNOSIS — J45909 Unspecified asthma, uncomplicated: Secondary | ICD-10-CM | POA: Diagnosis present

## 2016-02-28 DIAGNOSIS — F419 Anxiety disorder, unspecified: Secondary | ICD-10-CM | POA: Diagnosis present

## 2016-02-28 DIAGNOSIS — Z825 Family history of asthma and other chronic lower respiratory diseases: Secondary | ICD-10-CM

## 2016-02-28 DIAGNOSIS — M419 Scoliosis, unspecified: Secondary | ICD-10-CM | POA: Insufficient documentation

## 2016-02-28 DIAGNOSIS — K219 Gastro-esophageal reflux disease without esophagitis: Secondary | ICD-10-CM | POA: Diagnosis present

## 2016-02-28 DIAGNOSIS — Z3A40 40 weeks gestation of pregnancy: Secondary | ICD-10-CM | POA: Diagnosis not present

## 2016-02-28 DIAGNOSIS — Z87891 Personal history of nicotine dependence: Secondary | ICD-10-CM | POA: Diagnosis not present

## 2016-02-28 DIAGNOSIS — Z8249 Family history of ischemic heart disease and other diseases of the circulatory system: Secondary | ICD-10-CM

## 2016-02-28 DIAGNOSIS — J302 Other seasonal allergic rhinitis: Secondary | ICD-10-CM | POA: Insufficient documentation

## 2016-02-28 DIAGNOSIS — K589 Irritable bowel syndrome without diarrhea: Secondary | ICD-10-CM | POA: Diagnosis present

## 2016-02-28 DIAGNOSIS — D6801 Von willebrand disease, type 1: Secondary | ICD-10-CM | POA: Diagnosis present

## 2016-02-28 DIAGNOSIS — Z888 Allergy status to other drugs, medicaments and biological substances status: Secondary | ICD-10-CM

## 2016-02-28 DIAGNOSIS — O9962 Diseases of the digestive system complicating childbirth: Secondary | ICD-10-CM | POA: Diagnosis present

## 2016-02-28 DIAGNOSIS — Z886 Allergy status to analgesic agent status: Secondary | ICD-10-CM

## 2016-02-28 DIAGNOSIS — D680 Von Willebrand's disease: Secondary | ICD-10-CM | POA: Diagnosis present

## 2016-02-28 DIAGNOSIS — O9952 Diseases of the respiratory system complicating childbirth: Secondary | ICD-10-CM | POA: Diagnosis present

## 2016-02-28 DIAGNOSIS — O480 Post-term pregnancy: Secondary | ICD-10-CM | POA: Diagnosis present

## 2016-02-28 DIAGNOSIS — L309 Dermatitis, unspecified: Secondary | ICD-10-CM | POA: Diagnosis present

## 2016-02-28 DIAGNOSIS — O99344 Other mental disorders complicating childbirth: Secondary | ICD-10-CM | POA: Diagnosis present

## 2016-02-28 DIAGNOSIS — O9912 Other diseases of the blood and blood-forming organs and certain disorders involving the immune mechanism complicating childbirth: Secondary | ICD-10-CM | POA: Diagnosis present

## 2016-02-28 DIAGNOSIS — Z833 Family history of diabetes mellitus: Secondary | ICD-10-CM

## 2016-02-28 DIAGNOSIS — O99354 Diseases of the nervous system complicating childbirth: Secondary | ICD-10-CM | POA: Diagnosis present

## 2016-02-28 DIAGNOSIS — H1010 Acute atopic conjunctivitis, unspecified eye: Secondary | ICD-10-CM | POA: Insufficient documentation

## 2016-02-28 DIAGNOSIS — Z889 Allergy status to unspecified drugs, medicaments and biological substances status: Secondary | ICD-10-CM

## 2016-02-28 LAB — CBC
HCT: 37.7 % (ref 36.0–46.0)
HEMOGLOBIN: 12.7 g/dL (ref 12.0–15.0)
MCH: 30.1 pg (ref 26.0–34.0)
MCHC: 33.7 g/dL (ref 30.0–36.0)
MCV: 89.3 fL (ref 78.0–100.0)
Platelets: 240 10*3/uL (ref 150–400)
RBC: 4.22 MIL/uL (ref 3.87–5.11)
RDW: 13.8 % (ref 11.5–15.5)
WBC: 8.4 10*3/uL (ref 4.0–10.5)

## 2016-02-28 LAB — TYPE AND SCREEN
ABO/RH(D): O POS
Antibody Screen: NEGATIVE

## 2016-02-28 MED ORDER — ONDANSETRON HCL 4 MG/2ML IJ SOLN: 4.0000 mg | Freq: Four times a day (QID) | INTRAMUSCULAR | Status: DC | PRN

## 2016-02-28 MED ORDER — FENTANYL CITRATE (PF) 100 MCG/2ML IJ SOLN: 50.0000 ug | INTRAMUSCULAR | Status: DC | PRN

## 2016-02-28 MED ORDER — OXYTOCIN 10 UNIT/ML IJ SOLN: 2.5000 [IU]/h | INTRAVENOUS | Status: DC

## 2016-02-28 MED ORDER — TERBUTALINE SULFATE 1 MG/ML IJ SOLN
0.2500 mg | Freq: Once | INTRAMUSCULAR | Status: DC | PRN
  Filled 2016-02-28: qty 1

## 2016-02-28 MED ORDER — OXYCODONE-ACETAMINOPHEN 5-325 MG PO TABS: 2.0000 | ORAL_TABLET | ORAL | Status: DC | PRN

## 2016-02-28 MED ORDER — LIDOCAINE HCL (PF) 1 % IJ SOLN
30.0000 mL | INTRAMUSCULAR | Status: DC | PRN
  Filled 2016-02-28: qty 30

## 2016-02-28 MED ORDER — OXYTOCIN 10 UNIT/ML IJ SOLN
1.0000 m[IU]/min | INTRAVENOUS | Status: DC
  Filled 2016-02-28: qty 10

## 2016-02-28 MED ORDER — LACTATED RINGERS IV SOLN
INTRAVENOUS | Status: DC
  Administered 2016-02-28: 23:00:00 via INTRAVENOUS

## 2016-02-28 MED ORDER — OXYTOCIN 10 UNIT/ML IJ SOLN
1.0000 m[IU]/min | INTRAVENOUS | Status: DC
  Administered 2016-02-28: 1 m[IU]/min via INTRAVENOUS
  Filled 2016-02-28: qty 10

## 2016-02-28 MED ORDER — OXYTOCIN BOLUS FROM INFUSION
500.0000 mL | INTRAVENOUS | Status: DC
  Administered 2016-02-29: 500 mL via INTRAVENOUS

## 2016-02-28 MED ORDER — CITRIC ACID-SODIUM CITRATE 334-500 MG/5ML PO SOLN: 30.0000 mL | ORAL | Status: DC | PRN

## 2016-02-28 MED ORDER — LACTATED RINGERS IV SOLN
500.0000 mL | INTRAVENOUS | Status: DC | PRN
  Administered 2016-02-29: 500 mL via INTRAVENOUS

## 2016-02-28 MED ORDER — FLEET ENEMA 7-19 GM/118ML RE ENEM: 1.0000 | ENEMA | RECTAL | Status: DC | PRN

## 2016-02-28 MED ORDER — MISOPROSTOL 25 MCG QUARTER TABLET: 25.0000 ug | ORAL_TABLET | ORAL | Status: DC | PRN

## 2016-02-28 MED ORDER — OXYCODONE-ACETAMINOPHEN 5-325 MG PO TABS: 1.0000 | ORAL_TABLET | ORAL | Status: DC | PRN

## 2016-02-28 MED ORDER — ACETAMINOPHEN 325 MG PO TABS: 650.0000 mg | ORAL_TABLET | ORAL | Status: DC | PRN

## 2016-02-28 NOTE — Progress Notes (Addendum)
Assuming care of Kendra Patton), 30 yo G2P1001 @ 40.2 wks admitted earlier this morning for IOL. Spouse at bedside. FB out at 2033 per RN.  Subjective: Feeling occ ctx. +FM. Denies bleeding. Continues to leak clear fluid.   Objective: BP 121/72 mmHg  Pulse 86  Temp(Src) 98 F (36.7 C) (Oral)  Resp 18  Ht 5' 5.5" (1.664 m)  Wt 75.751 kg (167 lb)  BMI 27.36 kg/m2  LMP 05/22/2015 Today's Vitals   02/28/16 1800 02/28/16 1852 02/28/16 1900 02/28/16 2046  BP:  125/65  121/72  Pulse:  79  86  Temp:  98 F (36.7 C)  98 F (36.7 C)  TempSrc:  Oral  Oral  Resp:    18  Height:      Weight:      PainSc: 0-No pain  0-No pain 3    FHT: BL 135 w/ moderate variability, +accels, no decels UC: Mostly irritability SVE: 3-4/60/-3 per RN   Assessment:  IUP at 40.2 wks Type 1 von Willebrand's disorder H/O fetal cytomegalovirus syndrome (w/ baby 5 1/2 years ago) H/O MTHFR mutation H/O HSV-1 Asthma Latent labor FWB: Cat 1 GBS neg  Plan: SCDs. Reviewed plan of care with patient and spouse. Risks and benefits of Pitocin induction were reviewed, including failure of method, prolonged labor, need for further intervention, risk of cesarean.Pt and spouse seem to understand these risks and wish to proceed. Dr. Nelda Marseille updated.  Farrel Gordon CNM 02/28/2016, 8:58 PM

## 2016-02-28 NOTE — Progress Notes (Signed)
Subjective: Sitting in bed in high fowler, husband at bedside for support. Reports discomfort in her back from the bed and feeling contractions "like strong menstrual cramps" .  Coping well with cramping/contractions.   Objective: BP 120/62 mmHg  Pulse 76  Temp(Src) 97.7 F (36.5 C) (Oral)  Resp 18  Ht 5' 5.5" (1.664 m)  Wt 75.751 kg (167 lb)  BMI 27.36 kg/m2  LMP 05/22/2015     Filed Vitals:   02/28/16 0925 02/28/16 1016 02/28/16 1430  BP: 125/83  120/62  Pulse: 91  76  Temp: 97.7 F (36.5 C)  97.7 F (36.5 C)  TempSrc: Oral  Oral  Resp:   18  Height:  5' 5.5" (1.664 m)   Weight:  75.751 kg (167 lb)      FHT: Category 1, 125 bpm, moderate variability, +accels, no decels UC:   irregular, every 2-6 minutes SVE:   Dilation: 1.5 Effacement (%): 70 Station: Ballotable Exam by:: , CNM Membranes: SROM during foley bulb insertion at 1145  Induction/Augmentation:  Foley Bulb placed at 1145   Pain management: Desires to hold off on epidural as long as possible   Assessment:  IUP at 40.2 wks Type 1 von Willebrand's disorder Latent labor Foley bulb present SROM x 4.5 hrs, no S&S infection FWB: Cat 1 GBS neg  Plan: Pain med/epidural prn  RN to check foley bulb placement every hour and notify CNM when foley bulb is expelled Continue other management as ordered   Lavetta Nielsen CNM, MN 02/28/2016, 4:13 PM

## 2016-02-28 NOTE — Consults (Signed)
  Anesthesia Pain Consult Note  Patient: Kendra Patton, 30 y.o., female  Consult Requested by: Waymon Amato, MD  Reason for Consult: CRNA pain rounds  Level of Consciousness: alert and oriented  Pain: 0 on 1-10  Goal: 5

## 2016-02-28 NOTE — H&P (Signed)
Kendra Patton) is a 30 y.o. female, G2P1001 at 40.2 weeks, presenting for IOL. Endorses FM and mild ctxs. Denies VB or LOF.   Patient Active Problem List   Diagnosis Date Noted  . Anxiety - dx'd at age 31 02/28/2016  . Eczema 02/28/2016  . IBS (irritable bowel syndrome) 02/28/2016  . Scoliosis 02/28/2016  . Seasonal allergies 02/28/2016  . Allergy history, drug -- Compazine (dystonic rxn; seizures) 02/27/2016  . High-risk pregnancy supervision 10/27/2015  . Von Willebrand disease, type I ([place]) 07/11/2015  . 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 Asthma 07/11/2015    History of present pregnancy: Patient entered care at 5.6 weeks.   EDC of 02/26/16 was established by LMP.   Anatomy scan: 19.0 weeks, anterior/fundal placenta. 4 chamber, RVOT, LVOT, AA, DA, profile, NB, left hand and 5th digit not visualized due to position. Adnexas within normal limits.    Additional [place] evaluations: [redacted]w[redacted]d (dating): Transvaginal images. Anteverted uterus. Singleton IUP = [redacted]w[redacted]d; YS seen. Ovaries and adnexas unremarkable. [redacted]w[redacted]d (f/u anatomy): Transabdominal images obtained. Singleton pregnancy. Vertex presentation. Anterior placenta. Placental edge is 8.0 cm from internal os, normal. Cvx appears closed. Fluid appears normal. Profile, 4 chamber heart, LVOT, RVOT, bilateral hands are well seen, appear normal. Unable to visualize aortic arch and ductal arch due to fetal position, movement. EFW 505 grams, 40%. [redacted]w[redacted]d (BPP due to decreased FM): Transabdominal images obtained. Singleton pregnancy. Transverse presentation, head left. Anterior/fundal placenta. Cvx appears closed. Fluid appears normal. BPP 8/8 in 15 minutes. [redacted]w[redacted]d (S<D): EFW 7lb 0z -- 3161 g; Singleton pregnancy. Vtx presentation. Anterior placenta. Normal AFI -- 50th%. Adnexas are unremarkable. Abdomen lagging @ 5.4%.  [redacted]w[redacted]d (BPP): Singleton pregnancy. Vtx presentation. Cvx not seen per protocol. Anterior placenta. AFI = 50%. BPP 8/8 in 8 minutes. Adnexas  unremarkable.  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 Exposed to Listeria in Nov 2016; labs normal. H/O MTHFR mutation. Normal pregnancy discomforts, especially constipation - relief measures reviewed. Seen in MAU on 07/11/15 due to spotting and cramping -- Small [facility] noted (2.8 x 1.5 x 2.1 cm). +BV - treated w/ Clindamycin 7-day course. Seen at Urgent Care due to sore throat at 26 wks - culture neg; comfort measures reviewed. Had elevated 1hr (140); normal Hemoglobin A1C (declined checking sugars and requested repeat test which was 94). C/O ctxs in the latter half of pregnancy as well as leaking complaint x 2 -- neg findings. Cvx 1/30/-3 at 40 wks (02/26/16). TWG 37 lbs. Declined both flu and Tdap.      Last evaluation: Office 02/27/16 by Donnel Saxon, CNM @ 40.1 wks. BP 102/60. +Ctxs.   OB History    Gravida Para Term Preterm AB TAB SAB Ectopic Multiple Living   2 1 1       1      SVB on 08/19/10 @ 39 wks; female infant, birthwt 7+5 (states developed large bruise at site of epidural catheter)  Past Medical History  Diagnosis Date  . Von Willebrand disease ([place])   . Asthma   . High-risk pregnancy supervision 10/27/2015  . History of CMV     last pregnancy  . Anxiety     age 55  . Scoliosis   . Eczema   . IBS (irritable bowel syndrome)    Past Surgical History  Procedure Laterality Date  . Wisdom tooth extraction     Family History: family history includes Asthma in her mother and sister; Cancer  in her father, maternal grandmother, mother, and paternal grandfather; Depression in her mother and sister; Diabetes in her mother; Heart disease in her mother; Hypertension in her mother and sister; Learning disabilities in her sister; Varicose Veins in her mother and sister.DM in her sister Social History:  reports that she quit smoking about 9 months ago. She does not have any smokeless tobacco history on file. She reports that she  does not drink alcohol or use illicit drugs. Patient is married, with FOB Berenice Primas) involved and supportive.She is Caucasian, has no religious preference, is college educated and a homemaker. She will accept blood in an emergency.    Prenatal Transfer Tool  Maternal Diabetes: No Genetic Screening: Low risk Panorama Maternal Ultrasounds/Referrals: Normal Fetal Ultrasounds or other Referrals: Declined MFM referral Maternal Substance Abuse:  No Significant Maternal Medications:  Meds include: Other: PNV, Albuterol inhaler, Probiotic, Vit C prn Significant Maternal Lab Results: Lab values include: Group B Strep negative  TDAP: Declined Flu: Declined  ROS: 10 Systems reviewed and are negative for acute change except as noted in the HPI.   Allergies  Allergen Reactions  . Compazine [Prochlorperazine Edisylate]     Dystonic.    SVE:  1/70/-2; midline; Membranes intact   Last menstrual period 05/22/2015.  Chest clear Heart RRR without murmur Abd gravid, NT, FH 40cm Pelvic: As above Bishop score:  EFW: pelvis proven to 7+5 Cephalic by Le[place] and VE  FHR:  Cat 1 tracing, 125 bpm, moderate variability, +accels, no decels  UCs:  rare   Prenatal labs: ABO, Rh: O/Positive/-- (07/13 0000) Antibody: Neg (07/02/15) Rubella: Immune (07/02/15) RPR: Nonreactive (07/13 0000)  HBsAg: Negative (07/13 0000)  HIV: Non-reactive (07/13 0000)  GBS: Negative (02/07 0000) Sickle cell/Hgb electrophoresis: NA Pap: Neg per pt report (01/21/15) GC: Neg (07/02/15) Chlamydia: Neg (07/02/15) Genetic screenings: Normal Glucola: normal at 94 on 01/21/16 (elevated at 140 on 11/26/15) Other: normal TSH (07/11/15); normal listeria antibody (11/12/15); normal AST & ALT (01/27/16); neg fern (02/16/16); neg group A strep (02/20/16)  Hgb 12.7 at NOB, 12.5 at 28 weeks and 12.3 on 01/21/16   Assessment: IUP at 40.2 wks Type 1 von Willebrand's disorder 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 Nelda Marseille Routine [facility] orders Pain med/epidural prn  Discussed R/B of induction utilizing any or all the following interventions:  Cytotec, foley bulb, AROM, and/or pitocin Will proceed with Foley bulb for induction/augmentation- pt agreeable Type and Screen for 2 units of Blood Will work with pharmacy regarding Humate P60 units as recommended (below) by Hematalogist Expect SVD  Per Hematologist (Dr. Waymon Budge), "I checked factor levels on February 7 and they are all elevated as expected with factor VIII activity 166%, von Willebrand antigen 136%, and ristocetin cofactor activity 110% of control. I do not anticipate any issues if she needs an epidural again. Levels typically don't start to fall until after delivery. She is clear for vaginal delivery and use of epidural anesthesia. Monitor closely postpartum. If any increased bleeding encountered then treat with DDAVP 0.3 mcg/kg IV in 100 mL normal saline over 20 minutes using pre-pregnant weight of 60 kg total dose 18 g. This dose can be repeated at 12 hours and then at 24 hour intervals. If she does receive the drug, thin fluids should be restricted to avoid hyponatremia from the antidiuretic  effects of this hormone. Should she require a cesarean section, she should be pretreated one hour prior to surgery with Humate-P 60 units per kilogram, total dose 3600 mg with maintenance doses every 8-12 hours over the next 72 hours based on factor VIII levels. I will alert main pharmacy at [facility] to have product available on-call for emergencies at [facility]. I can be reached 24/7 on my cell phone if any advice is needed or if unanticipated problems arise: [phone]."   Lavetta Nielsen, CNM, MSN 02/28/2016, 9:14 AM

## 2016-02-28 NOTE — Progress Notes (Signed)
Subjective: Doing well, states contraction were getting stronger and then "went away".  Currently relaxing watching TV. Husband at bedside.   Objective: BP 120/62 mmHg  Pulse 76  Temp(Src) 97.7 F (36.5 C) (Oral)  Resp 18  Ht 5' 5.5" (1.664 m)  Wt 75.751 kg (167 lb)  BMI 27.36 kg/m2  LMP 05/22/2015      FHT: Category 1, 135bpm, moderate variability, +accels, no decels UC:   Irregular contractions SVE:   Dilation: 1.5 Effacement (%): 70 Station: Ballotable Exam by:: , CNM Membranes: SROM during foley bulb insertion at 1145  Induction/Augmentation:  Foley Bulb placed at 1145   Pain management: Desires to hold off on epidural as long as possible   Assessment:  IUP at 40.2 wks Type 1 von Willebrand's disorder Latent labor Foley bulb present SROM x 8 hrs, no S&S infection FWB: Cat 1 GBS neg  Plan: Report to oncoming CNM, [place] Pain med/epidural prn  RN to check foley bulb placement every hour and notify CNM when foley bulb is expelled Continue other management as ordered    [place], [place] 02/28/2016, 6:40 PM

## 2016-02-28 NOTE — Progress Notes (Signed)
Subjective: Doing well.  Pt reports being nervous regarding induction.  Husband at bedside for support.    Objective: BP 125/83 mmHg  Pulse 91  Temp(Src) 97.7 F (36.5 C) (Oral)  Ht 5' 5.5" (1.664 m)  Wt 75.751 kg (167 lb)  BMI 27.36 kg/m2  LMP 05/22/2015     FHT: Category 1, 125 bpm, moderate variability, +accels, no decels UC:   Uterine irritability SVE:   Dilation: 1.5 Effacement (%): 70 Station: Ballotable Exam by:: , CNM Membranes: SROM during foley bulb insertion at 1145   Induction/Augmentation:  Foley Bulb placement without difficulty at 1145   Pain management: Desires to hold off on epidural as long as possible   Assessment:  IUP at 40.2 wks Type 1 von Willebrand's disorder H/O fetal cytomegalovirus syndrome (w/ baby 5 1/2 years ago) H/O MTHFR mutation H/O HSV-1 Asthma Latent labor SVE: 1/70/-2 Membrane intact FWB: Cat 1 GBS neg  Plan: Pain med/epidural prn  RN to check foley bulb placement every hour and notify CNM when foley bulb is expelled Continue other management as ordered   [place], [place] 02/28/2016, 12:10 PM

## 2016-02-28 NOTE — Progress Notes (Signed)
Spouse at bedside.  Subjective: Coping w/ ctxs. Wants to hold off on epidural for as long as possible. Stated earlier that she does not desire IV pain meds. +FM.  Objective: BP 123/70 mmHg  Pulse 74  Temp(Src) 97.7 F (36.5 C) (Oral)  Resp 18  Ht 5' 5.5" (1.664 m)  Wt 75.751 kg (167 lb)  BMI 27.36 kg/m2  LMP 05/22/2015 Today's Vitals   02/28/16 2156 02/28/16 2231 02/28/16 2233 02/28/16 2306  BP: 111/62  111/69 123/70  Pulse: 83  72 74  Temp:   97.7 F (36.5 C)   TempSrc:   Oral   Resp: 18  18 18   Height:      Weight:      PainSc:  6       FHT: Category 1 UC:   irregular, every 2-4 minutes SVE:   Dilation: 4.5 Effacement (%): 80 Station: -2 Exam by:: B Boyer RN @ [id number] Pitocin at 5 mU/min  Assessment:  IUP term Type 1 von Willebrand's disorder Latent labor GBS neg   Plan: Continue current plan Cytotec pr post delivery Consult as indicated  Farrel Gordon CNM 02/28/2016, 11:31 PM

## 2016-02-29 ENCOUNTER — Encounter (HOSPITAL_COMMUNITY): Payer: Self-pay | Admitting: Anesthesiology

## 2016-02-29 ENCOUNTER — Inpatient Hospital Stay (HOSPITAL_COMMUNITY): Payer: BLUE CROSS/BLUE SHIELD | Admitting: Anesthesiology

## 2016-02-29 LAB — CBC
HCT: 36.8 % (ref 36.0–46.0)
HEMOGLOBIN: 12.2 g/dL (ref 12.0–15.0)
MCH: 30 pg (ref 26.0–34.0)
MCHC: 33.2 g/dL (ref 30.0–36.0)
MCV: 90.4 fL (ref 78.0–100.0)
Platelets: 188 10*3/uL (ref 150–400)
RBC: 4.07 MIL/uL (ref 3.87–5.11)
RDW: 13.8 % (ref 11.5–15.5)
WBC: 16 10*3/uL — ABNORMAL HIGH (ref 4.0–10.5)

## 2016-02-29 LAB — RPR: RPR: NONREACTIVE

## 2016-02-29 MED ORDER — ONDANSETRON HCL 4 MG/2ML IJ SOLN: 4.0000 mg | INTRAMUSCULAR | Status: DC | PRN

## 2016-02-29 MED ORDER — WITCH HAZEL-GLYCERIN EX PADS: 1.0000 "application " | MEDICATED_PAD | CUTANEOUS | Status: DC | PRN

## 2016-02-29 MED ORDER — PRENATAL MULTIVITAMIN CH
1.0000 | ORAL_TABLET | Freq: Every day | ORAL | Status: DC
  Administered 2016-03-01: 1 via ORAL
  Filled 2016-02-29: qty 1

## 2016-02-29 MED ORDER — OXYCODONE-ACETAMINOPHEN 5-325 MG PO TABS
1.0000 | ORAL_TABLET | ORAL | Status: DC | PRN
  Administered 2016-03-01 (×2): 1 via ORAL
  Filled 2016-02-29 (×2): qty 1

## 2016-02-29 MED ORDER — FENTANYL 2.5 MCG/ML BUPIVACAINE 1/10 % EPIDURAL INFUSION (WH - ANES)
14.0000 mL/h | INTRAMUSCULAR | Status: DC | PRN
Start: 2016-02-29 — End: 2016-02-29
  Administered 2016-02-29 (×2): 14 mL/h via EPIDURAL
  Filled 2016-02-29: qty 125

## 2016-02-29 MED ORDER — TETANUS-DIPHTH-ACELL PERTUSSIS 5-2.5-18.5 LF-MCG/0.5 IM SUSP: 0.5000 mL | Freq: Once | INTRAMUSCULAR | Status: DC

## 2016-02-29 MED ORDER — DIPHENHYDRAMINE HCL 25 MG PO CAPS: 25.0000 mg | ORAL_CAPSULE | Freq: Four times a day (QID) | ORAL | Status: DC | PRN

## 2016-02-29 MED ORDER — LIDOCAINE HCL (PF) 1 % IJ SOLN
INTRAMUSCULAR | Status: DC | PRN
  Administered 2016-02-29 (×2): 4 mL via EPIDURAL

## 2016-02-29 MED ORDER — ZOLPIDEM TARTRATE 5 MG PO TABS: 5.0000 mg | ORAL_TABLET | Freq: Every evening | ORAL | Status: DC | PRN

## 2016-02-29 MED ORDER — ACETAMINOPHEN 325 MG PO TABS: 650.0000 mg | ORAL_TABLET | ORAL | Status: DC | PRN

## 2016-02-29 MED ORDER — BENZOCAINE-MENTHOL 20-0.5 % EX AERO: 1.0000 "application " | INHALATION_SPRAY | CUTANEOUS | Status: DC | PRN

## 2016-02-29 MED ORDER — ONDANSETRON HCL 4 MG PO TABS: 4.0000 mg | ORAL_TABLET | ORAL | Status: DC | PRN

## 2016-02-29 MED ORDER — EPHEDRINE 5 MG/ML INJ
10.0000 mg | INTRAVENOUS | Status: DC | PRN
  Filled 2016-02-29: qty 2

## 2016-02-29 MED ORDER — DIPHENHYDRAMINE HCL 50 MG/ML IJ SOLN: 12.5000 mg | INTRAMUSCULAR | Status: DC | PRN

## 2016-02-29 MED ORDER — ALBUTEROL SULFATE (2.5 MG/3ML) 0.083% IN NEBU: 3.0000 mL | INHALATION_SOLUTION | Freq: Four times a day (QID) | RESPIRATORY_TRACT | Status: DC | PRN

## 2016-02-29 MED ORDER — LANOLIN HYDROUS EX OINT: TOPICAL_OINTMENT | CUTANEOUS | Status: DC | PRN

## 2016-02-29 MED ORDER — MISOPROSTOL 200 MCG PO TABS
ORAL_TABLET | ORAL | Status: AC
  Filled 2016-02-29: qty 5

## 2016-02-29 MED ORDER — SENNOSIDES-DOCUSATE SODIUM 8.6-50 MG PO TABS
2.0000 | ORAL_TABLET | ORAL | Status: DC
  Administered 2016-02-29: 2 via ORAL
  Filled 2016-02-29: qty 2

## 2016-02-29 MED ORDER — SIMETHICONE 80 MG PO CHEW: 80.0000 mg | CHEWABLE_TABLET | ORAL | Status: DC | PRN

## 2016-02-29 MED ORDER — PHENYLEPHRINE 40 MCG/ML (10ML) SYRINGE FOR IV PUSH (FOR BLOOD PRESSURE SUPPORT)
80.0000 ug | PREFILLED_SYRINGE | INTRAVENOUS | Status: DC | PRN
  Filled 2016-02-29: qty 20
  Filled 2016-02-29: qty 2

## 2016-02-29 MED ORDER — LACTATED RINGERS IV SOLN
500.0000 mL | Freq: Once | INTRAVENOUS | Status: AC
  Administered 2016-02-29: 500 mL via INTRAVENOUS

## 2016-02-29 MED ORDER — DIBUCAINE 1 % RE OINT: 1.0000 "application " | TOPICAL_OINTMENT | RECTAL | Status: DC | PRN

## 2016-02-29 MED ORDER — PHENYLEPHRINE 40 MCG/ML (10ML) SYRINGE FOR IV PUSH (FOR BLOOD PRESSURE SUPPORT)
80.0000 ug | PREFILLED_SYRINGE | INTRAVENOUS | Status: DC | PRN
  Filled 2016-02-29: qty 2

## 2016-02-29 NOTE — Progress Notes (Signed)
Daylight savings today at 2am; thus no charting between 2:00-3:00.

## 2016-02-29 NOTE — Lactation Note (Signed)
This note was copied from a baby's chart. Lactation Consultation Note  Experienced BF mother reports that BF is going well and that she is feeding on both breasts.  She BF her first child for 2.5 years.  That child fed every hour for 5 minutes at a time and refused the right breast. Though she gained weight this contributed to a less than optimal supply and she could only express 1 ounce per pumping session.  Mom inquired about mother's milk tea which was discouraged for now. Post-pumping 4 times in 24 hours was encouraged to help drain the breast well. Pumping can be initiated when mom desires.  Mom shared that her first child had a "lip tie" swallowing problems and texture issues which were resolved with therapy. Requested that mom call for a LATCH assessment every 8 hours at the minimum. Follow-up tomorrow.  Patient Name: Girl Innocence Sosna [id number] Date: 02/29/2016 Reason for consult: Initial assessment   Maternal Data Has patient been taught Hand Expression?: Yes  Feeding Feeding Type: Breast Fed Length of feed: 40 min  LATCH Score/Interventions Latch: Grasps breast easily, tongue down, lips flanged, rhythmical sucking.  Audible Swallowing: None  Type of Nipple: Everted at rest and after stimulation  Comfort (Breast/Nipple): Soft / non-tender     Hold (Positioning): No assistance needed to correctly position infant at breast.  LATCH Score: 8  Lactation Tools Discussed/Used     Consult Status Consult Status: Follow-up Date: 03/01/16 Follow-up type: In-patient    Van Clines 02/29/2016, 3:17 PM

## 2016-02-29 NOTE — Anesthesia Procedure Notes (Signed)
Epidural Patient location during procedure: OB Start time: 02/29/2016 1:06 AM  Staffing Anesthesiologist: Josephine Igo Performed by: anesthesiologist   Preanesthetic Checklist Completed: patient identified, site marked, surgical consent, pre-op evaluation, timeout performed, IV checked, risks and benefits discussed and monitors and equipment checked  Epidural Patient position: sitting Prep: site prepped and draped and DuraPrep Patient monitoring: continuous pulse ox and blood pressure Approach: midline Location: L3-L4 Injection technique: LOR air  Needle:  Needle type: Tuohy  Needle gauge: 17 G Needle length: 9 cm and 9 Needle insertion depth: 5 cm cm Catheter type: closed end flexible Catheter size: 19 Gauge Catheter at skin depth: 10 cm Test dose: negative and Other  Assessment Events: blood not aspirated, injection not painful, no injection resistance, negative IV test and no paresthesia  Additional Notes Patient identified. Risks and benefits discussed including failed block, incomplete  Pain control, post dural puncture headache, nerve damage, paralysis, blood pressure Changes, nausea, vomiting, reactions to medications-both toxic and allergic and post Partum back pain. All questions were answered. Patient expressed understanding and wished to proceed. Sterile technique was used throughout procedure. Epidural site was Dressed with sterile barrier dressing. No paresthesias, signs of intravascular injection Or signs of intrathecal spread were encountered.  Patient was more comfortable after the epidural was dosed. Please see RN's note for documentation of vital signs and FHR which are stable.

## 2016-02-29 NOTE — Anesthesia Preprocedure Evaluation (Signed)
Anesthesia Evaluation  Patient identified by MRN, date of birth, ID band Patient awake    Reviewed: Allergy & Precautions, Patient's Chart, lab work & pertinent test results  Airway Mallampati: II  TM Distance: >3 FB Neck ROM: Full    Dental no notable dental hx. (+) Teeth Intact   Pulmonary asthma , former smoker,    Pulmonary exam normal breath sounds clear to auscultation       Cardiovascular negative cardio ROS Normal cardiovascular exam Rhythm:Regular Rate:Normal     Neuro/Psych Anxiety negative neurological ROS     GI/Hepatic GERD  Medicated and Controlled,  Endo/Other  negative endocrine ROS  Renal/GU negative Renal ROS  negative genitourinary   Musculoskeletal negative musculoskeletal ROS (+)   Abdominal   Peds  Hematology Von Willebrand's disease   Anesthesia Other Findings   Reproductive/Obstetrics (+) Pregnancy                             Anesthesia Physical Anesthesia Plan  ASA: II  Anesthesia Plan: Epidural   Post-op Pain Management:    Induction:   Airway Management Planned: Natural Airway  Additional Equipment:   Intra-op Plan:   Post-operative Plan:   Informed Consent: I have reviewed the patients History and Physical, chart, labs and discussed the procedure including the risks, benefits and alternatives for the proposed anesthesia with the patient or authorized representative who has indicated his/her understanding and acceptance.     Plan Discussed with: Anesthesiologist  Anesthesia Plan Comments:         Anesthesia Quick Evaluation

## 2016-02-29 NOTE — Anesthesia Postprocedure Evaluation (Signed)
Anesthesia Post Note  Patient: Kendra Patton  Procedure(s) Performed: * No procedures listed *  Patient location during evaluation: Mother Baby Anesthesia Type: Epidural Level of consciousness: awake, awake and alert and oriented Pain management: pain level controlled Vital Signs Assessment: post-procedure vital signs reviewed and stable Respiratory status: spontaneous breathing Cardiovascular status: stable Postop Assessment: no headache, no backache, patient able to bend at knees, no signs of nausea or vomiting and adequate PO intake Anesthetic complications: no    Last Vitals:  Filed Vitals:   02/29/16 0800 02/29/16 1200  BP: 111/58 115/57  Pulse: 89 82  Temp: 36.9 C 36.9 C  Resp: 18 18    Last Pain:  Filed Vitals:   02/29/16 1204  PainSc: 0-No pain                 , [place]

## 2016-03-01 MED ORDER — OXYCODONE-ACETAMINOPHEN 5-325 MG PO TABS: 1.0000 | ORAL_TABLET | ORAL | Status: DC | PRN

## 2016-03-01 MED ORDER — NORETHINDRONE 0.35 MG PO TABS: 1.0000 | ORAL_TABLET | Freq: Every day | ORAL | Status: DC

## 2016-03-01 NOTE — Discharge Summary (Signed)
OB Discharge Summary     Patient Name: Kendra Patton DOB: 21-May-1986 MRN: [id number]  Date of admission: 02/28/2016 Delivering MD: Janyth Pupa   Date of discharge: 03/01/2016  Admitting diagnosis: INDUCTION Intrauterine pregnancy: [redacted]w[redacted]d     Secondary diagnosis:  Principal Problem:   Vaginal delivery Active Problems:   Von Willebrand disease, type I ([place])   Aspirin allergy   Asthma   Allergy history, drug -- Compazine (dystonic rxn; seizures)  Additional problems: none     Discharge diagnosis: Term Pregnancy Delivered                                                                                                Post partum procedures:none  Augmentation: AROM, Pitocin and Foley Balloon  Complications: None  Hospital course:  Induction of Labor With Vaginal Delivery   30 y.o. yo G2P2002 at [redacted]w[redacted]d was admitted to the hospital 02/28/2016 for induction of labor.  Indication for induction: fetal abdominal circumfrance lagging.  Patient had an uncomplicated labor course as follows: Membrane Rupture Time/Date: 11:45 AM ,02/28/2016   Intrapartum Procedures: Episiotomy: None [1]                                         Lacerations:  None [1]  Patient had delivery of a Viable infant.  Information for the patient's newborn:  Edee, Valero [id number]  Delivery Method: Vaginal, Spontaneous Delivery (Filed from Delivery Summary)    02/29/2016  Details of delivery can be found in separate delivery note.  Patient had a routine postpartum course. Patient is discharged home 03/01/2016.   Physical exam  Filed Vitals:   02/29/16 0800 02/29/16 1200 02/29/16 1700 03/01/16 0436  BP: 111/58 115/57 111/74 100/51  Pulse: 89 82 91 64  Temp: 98.5 F (36.9 C) 98.5 F (36.9 C) 98 F (36.7 C) 98 F (36.7 C)  TempSrc: Oral Oral Oral Oral  Resp: 18 18 18 18   Height:      Weight:      SpO2:       General: alert and cooperative Lochia: appropriate Uterine Fundus: firm Laceration:   None,  perineum healing well DVT Evaluation: No evidence of DVT seen on physical exam. Labs: Lab Results  Component Value Date   WBC 16.0* 02/29/2016   HGB 12.2 02/29/2016   HCT 36.8 02/29/2016   MCV 90.4 02/29/2016   PLT 188 02/29/2016   CMP Latest Ref Rng 02/03/2010  Glucose 70 - 99 mg/dL 72  BUN 6 - 23 mg/dL 10  Creatinine 0.40 - 1.20 mg/dL 0.41  Sodium 135 - 145 mEq/L 135  Potassium 3.5 - 5.3 mEq/L 4.6  Chloride 96 - 112 mEq/L 103  CO2 19 - 32 mEq/L 20  Calcium 8.4 - 10.5 mg/dL 8.9  Total Protein 6.0 - 8.3 g/dL 7.2  Total Bilirubin 0.3 - 1.2 mg/dL 0.3  Alkaline Phos 39 - 117 U/L 42  AST 0 - 37 U/L 14  ALT 0 - 35 U/L 12  Discharge instruction: per After Visit Summary and "Baby and Me Booklet".   Information for Postpartum care after vaginal delivery, breastfeeding, iron rich diet, Post partum blues and depression, and [place] provided.   After visit meds:    Medication List    TAKE these medications        albuterol 108 (90 Base) MCG/ACT inhaler  Commonly known as:  PROVENTIL HFA;VENTOLIN HFA  Inhale 1 puff into the lungs every 6 (six) hours as needed for wheezing or shortness of breath.     multivitamin-prenatal 27-0.8 MG Tabs tablet  Take 1 tablet by mouth daily at 12 noon.     norethindrone 0.35 MG tablet  Commonly known as:  ORTHO MICRONOR  Take 1 tablet (0.35 mg total) by mouth daily.  Start taking on:  03/22/2016     oxyCODONE-acetaminophen 5-325 MG tablet  Commonly known as:  PERCOCET/ROXICET  Take 1-2 tablets by mouth every 4 (four) hours as needed for moderate pain or severe pain.        Diet: routine diet  Activity: Advance as tolerated. Pelvic rest for 6 weeks.   Outpatient follow up:6 weeks Follow up Appt:No future appointments. Follow up Visit:No Follow-up on file.  Postpartum contraception: Progesterone only pills  Newborn Data: Live born female  Birth Weight: 6 lb 15.3 oz (3155 g) APGAR: 9, 9  Baby Feeding:  Breast Disposition:home with mother   03/01/2016 Lavetta Nielsen, CNM

## 2016-03-01 NOTE — Progress Notes (Signed)
Patient refuses all vaccines at this time.

## 2016-03-01 NOTE — Discharge Instructions (Signed)
Postpartum Care After Vaginal Delivery °After you deliver your newborn (postpartum period), the usual stay in the hospital is 24-72 hours. If there were problems with your labor or delivery, or if you have other medical problems, you might be in the hospital longer.  °While you are in the hospital, you will receive help and instructions on how to care for yourself and your newborn during the postpartum period.  °While you are in the hospital: °· Be sure to tell your nurses if you have pain or discomfort, as well as where you feel the pain and what makes the pain worse. °· If you had an incision made near your vagina (episiotomy) or if you had some tearing during delivery, the nurses may put ice packs on your episiotomy or tear. The ice packs may help to reduce the pain and swelling. °· If you are breastfeeding, you may feel uncomfortable contractions of your uterus for a couple of weeks. This is normal. The contractions help your uterus get back to normal size. °· It is normal to have some bleeding after delivery. °· For the first 1-3 days after delivery, the flow is red and the amount may be similar to a period. °· It is common for the flow to start and stop. °· In the first few days, you may pass some small clots. Let your nurses know if you begin to pass large clots or your flow increases. °· Do not  flush blood clots down the toilet before having the nurse look at them. °· During the next 3-10 days after delivery, your flow should become more watery and pink or brown-tinged in color. °· Ten to fourteen days after delivery, your flow should be a small amount of yellowish-white discharge. °· The amount of your flow will decrease over the first few weeks after delivery. Your flow may stop in 6-8 weeks. Most women have had their flow stop by 12 weeks after delivery. °· You should change your sanitary pads frequently. °· Wash your hands thoroughly with soap and water for at least 20 seconds after changing pads, using  the toilet, or before holding or feeding your newborn. °· You should feel like you need to empty your bladder within the first 6-8 hours after delivery. °· In case you become weak, lightheaded, or faint, call your nurse before you get out of bed for the first time and before you take a shower for the first time. °· Within the first few days after delivery, your breasts may begin to feel tender and full. This is called engorgement. Breast tenderness usually goes away within 48-72 hours after engorgement occurs. You may also notice milk leaking from your breasts. If you are not breastfeeding, do not stimulate your breasts. Breast stimulation can make your breasts produce more milk. °· Spending as much time as possible with your newborn is very important. During this time, you and your newborn can feel close and get to know each other. Having your newborn stay in your room (rooming in) will help to strengthen the bond with your newborn.  It will give you time to get to know your newborn and become comfortable caring for your newborn. °· Your hormones change after delivery. Sometimes the hormone changes can temporarily cause you to feel sad or tearful. These feelings should not last more than a few days. If these feelings last longer than that, you should talk to your caregiver. °· If desired, talk to your caregiver about methods of family planning or contraception. °·   Talk to your caregiver about immunizations. Your caregiver may want you to have the following immunizations before leaving the hospital:  Tetanus, diphtheria, and pertussis (Tdap) or tetanus and diphtheria (Td) immunization. It is very important that you and your family (including grandparents) or others caring for your newborn are up-to-date with the Tdap or Td immunizations. The Tdap or Td immunization can help protect your newborn from getting ill.  Rubella immunization.  Varicella (chickenpox) immunization.  Influenza immunization. You should  receive this annual immunization if you did not receive the immunization during your pregnancy.   This information is not intended to replace advice given to you by your health care provider. Make sure you discuss any questions you have with your health care provider.   Document Released: 10/03/2007 Document Revised: 08/30/2012 Document Reviewed: 08/02/2012 Elsevier Interactive Patient Education [employer]. Breastfeeding Deciding to breastfeed is one of the best choices you can make for you and your baby. A change in hormones during pregnancy causes your breast tissue to grow and increases the number and size of your milk ducts. These hormones also allow proteins, sugars, and fats from your blood supply to make breast milk in your milk-producing glands. Hormones prevent breast milk from being released before your baby is born as well as prompt milk flow after birth. Once breastfeeding has begun, thoughts of your baby, as well as his or her sucking or crying, can stimulate the release of milk from your milk-producing glands.  BENEFITS OF BREASTFEEDING For Your Baby  Your first milk (colostrum) helps your baby's digestive system function better.  There are antibodies in your milk that help your baby fight off infections.  Your baby has a lower incidence of asthma, allergies, and sudden infant death syndrome.  The nutrients in breast milk are better for your baby than infant formulas and are designed uniquely for your baby's needs.  Breast milk improves your baby's brain development.  Your baby is less likely to develop other conditions, such as childhood obesity, asthma, or type 2 diabetes mellitus. For You  Breastfeeding helps to create a very special bond between you and your baby.  Breastfeeding is convenient. Breast milk is always available at the correct temperature and costs nothing.  Breastfeeding helps to burn calories and helps you lose the weight gained during  pregnancy.  Breastfeeding makes your uterus contract to its prepregnancy size faster and slows bleeding (lochia) after you give birth.   Breastfeeding helps to lower your risk of developing type 2 diabetes mellitus, osteoporosis, and breast or ovarian cancer later in life. SIGNS THAT YOUR BABY IS HUNGRY Early Signs of Hunger  Increased alertness or activity.  Stretching.  Movement of the head from side to side.  Movement of the head and opening of the mouth when the corner of the mouth or cheek is stroked (rooting).  Increased sucking sounds, smacking lips, cooing, sighing, or squeaking.  Hand-to-mouth movements.  Increased sucking of fingers or hands. Late Signs of Hunger  Fussing.  Intermittent crying. Extreme Signs of Hunger Signs of extreme hunger will require calming and consoling before your baby will be able to breastfeed successfully. Do not wait for the following signs of extreme hunger to occur before you initiate breastfeeding:  Restlessness.  A loud, strong cry.  Screaming. BREASTFEEDING BASICS Breastfeeding Initiation  Find a comfortable place to sit or lie down, with your neck and back well supported.  Place a pillow or rolled up blanket under your baby to bring him or  her to the level of your breast (if you are seated). Nursing pillows are specially designed to help support your arms and your baby while you breastfeed.  Make sure that your baby's abdomen is facing your abdomen.  Gently massage your breast. With your fingertips, massage from your chest wall toward your nipple in a circular motion. This encourages milk flow. You may need to continue this action during the feeding if your milk flows slowly.  Support your breast with 4 fingers underneath and your thumb above your nipple. Make sure your fingers are well away from your nipple and your baby's mouth.  Stroke your baby's lips gently with your finger or nipple.  When your baby's mouth is open  wide enough, quickly bring your baby to your breast, placing your entire nipple and as much of the colored area around your nipple (areola) as possible into your baby's mouth.  More areola should be visible above your baby's upper lip than below the lower lip.  Your baby's tongue should be between his or her lower gum and your breast.  Ensure that your baby's mouth is correctly positioned around your nipple (latched). Your baby's lips should create a seal on your breast and be turned out (everted).  It is common for your baby to suck about 2-3 minutes in order to start the flow of breast milk. Latching Teaching your baby how to latch on to your breast properly is very important. An improper latch can cause nipple pain and decreased milk supply for you and poor weight gain in your baby. Also, if your baby is not latched onto your nipple properly, he or she may swallow some air during feeding. This can make your baby fussy. Burping your baby when you switch breasts during the feeding can help to get rid of the air. However, teaching your baby to latch on properly is still the best way to prevent fussiness from swallowing air while breastfeeding. Signs that your baby has successfully latched on to your nipple:  Silent tugging or silent sucking, without causing you pain.  Swallowing heard between every 3-4 sucks.  Muscle movement above and in front of his or her ears while sucking. Signs that your baby has not successfully latched on to nipple:  Sucking sounds or smacking sounds from your baby while breastfeeding.  Nipple pain. If you think your baby has not latched on correctly, slip your finger into the corner of your baby's mouth to break the suction and place it between your baby's gums. Attempt breastfeeding initiation again. Signs of Successful Breastfeeding Signs from your baby:  A gradual decrease in the number of sucks or complete cessation of sucking.  Falling asleep.  Relaxation  of his or her body.  Retention of a small amount of milk in his or her mouth.  Letting go of your breast by himself or herself. Signs from you:  Breasts that have increased in firmness, weight, and size 1-3 hours after feeding.  Breasts that are softer immediately after breastfeeding.  Increased milk volume, as well as a change in milk consistency and color by the fifth day of breastfeeding.  Nipples that are not sore, cracked, or bleeding. Signs That Your [employer] is Getting Enough Milk  Wetting at least 3 diapers in a 24-hour period. The urine should be clear and pale yellow by age 40 days.  At least 3 stools in a 24-hour period by age 40 days. The stool should be soft and yellow.  At least 3  stools in a 24-hour period by age 39 days. The stool should be seedy and yellow.  No loss of weight greater than 10% of birth weight during the first 43 days of age.  Average weight gain of 4-7 ounces (113-198 g) per week after age 32 days.  Consistent daily weight gain by age 68 days, without weight loss after the age of 2 weeks. After a feeding, your baby may spit up a small amount. This is common. BREASTFEEDING FREQUENCY AND DURATION Frequent feeding will help you make more milk and can prevent sore nipples and breast engorgement. Breastfeed when you feel the need to reduce the fullness of your breasts or when your baby shows signs of hunger. This is called "breastfeeding on demand." Avoid introducing a pacifier to your baby while you are working to establish breastfeeding (the first 4-6 weeks after your baby is born). After this time you may choose to use a pacifier. Research has shown that pacifier use during the first year of a baby's life decreases the risk of sudden infant death syndrome (SIDS). Allow your baby to feed on each breast as long as he or she wants. Breastfeed until your baby is finished feeding. When your baby unlatches or falls asleep while feeding from the first breast, offer the  second breast. Because newborns are often sleepy in the first few weeks of life, you may need to awaken your baby to get him or her to feed. Breastfeeding times will vary from baby to baby. However, the following rules can serve as a guide to help you ensure that your baby is properly fed:  Newborns (babies 85 weeks of age or younger) may breastfeed every 1-3 hours.  Newborns should not go longer than 3 hours during the day or 5 hours during the night without breastfeeding.  You should breastfeed your baby a minimum of 8 times in a 24-hour period until you begin to introduce solid foods to your baby at around 59 months of age. BREAST MILK PUMPING Pumping and storing breast milk allows you to ensure that your baby is exclusively fed your breast milk, even at times when you are unable to breastfeed. This is especially important if you are going back to work while you are still breastfeeding or when you are not able to be present during feedings. Your lactation consultant can give you guidelines on how long it is safe to store breast milk. A breast pump is a machine that allows you to pump milk from your breast into a sterile bottle. The pumped breast milk can then be stored in a refrigerator or freezer. Some breast pumps are operated by hand, while others use electricity. Ask your lactation consultant which type will work best for you. Breast pumps can be purchased, but some hospitals and breastfeeding support groups lease breast pumps on a monthly basis. A lactation consultant can teach you how to hand express breast milk, if you prefer not to use a pump. CARING FOR YOUR BREASTS WHILE YOU BREASTFEED Nipples can become dry, cracked, and sore while breastfeeding. The following recommendations can help keep your breasts moisturized and healthy:  Avoid using soap on your nipples.  Wear a supportive bra. Although not required, special nursing bras and tank tops are designed to allow access to your breasts  for breastfeeding without taking off your entire bra or top. Avoid wearing underwire-style bras or extremely tight bras.  Air dry your nipples for 3-58mnutes after each feeding.  Use only cotton bra pads  to absorb leaked breast milk. Leaking of breast milk between feedings is normal.  Use lanolin on your nipples after breastfeeding. Lanolin helps to maintain your skin's normal moisture barrier. If you use pure lanolin, you do not need to wash it off before feeding your baby again. Pure lanolin is not toxic to your baby. You may also hand express a few drops of breast milk and gently massage that milk into your nipples and allow the milk to air dry. In the first few weeks after giving birth, some women experience extremely full breasts (engorgement). Engorgement can make your breasts feel heavy, warm, and tender to the touch. Engorgement peaks within 3-5 days after you give birth. The following recommendations can help ease engorgement:  Completely empty your breasts while breastfeeding or pumping. You may want to start by applying warm, moist heat (in the shower or with warm water-soaked hand towels) just before feeding or pumping. This increases circulation and helps the milk flow. If your baby does not completely empty your breasts while breastfeeding, pump any extra milk after he or she is finished.  Wear a snug bra (nursing or regular) or tank top for 1-2 days to signal your body to slightly decrease milk production.  Apply ice packs to your breasts, unless this is too uncomfortable for you.  Make sure that your baby is latched on and positioned properly while breastfeeding. If engorgement persists after 48 hours of following these recommendations, contact your health care provider or a Science writer. OVERALL HEALTH CARE RECOMMENDATIONS WHILE BREASTFEEDING  Eat healthy foods. Alternate between meals and snacks, eating 3 of each per day. Because what you eat affects your breast milk,  some of the foods may make your baby more irritable than usual. Avoid eating these foods if you are sure that they are negatively affecting your baby.  Drink milk, fruit juice, and water to satisfy your thirst (about 10 glasses a day).  Rest often, relax, and continue to take your prenatal vitamins to prevent fatigue, stress, and anemia.  Continue breast self-awareness checks.  Avoid chewing and smoking tobacco. Chemicals from cigarettes that pass into breast milk and exposure to secondhand smoke may harm your baby.  Avoid alcohol and drug use, including marijuana. Some medicines that may be harmful to your baby can pass through breast milk. It is important to ask your health care provider before taking any medicine, including all over-the-counter and prescription medicine as well as vitamin and herbal supplements. It is possible to become pregnant while breastfeeding. If birth control is desired, ask your health care provider about options that will be safe for your baby. SEEK MEDICAL CARE IF:  You feel like you want to stop breastfeeding or have become frustrated with breastfeeding.  You have painful breasts or nipples.  Your nipples are cracked or bleeding.  Your breasts are red, tender, or warm.  You have a swollen area on either breast.  You have a fever or chills.  You have nausea or vomiting.  You have drainage other than breast milk from your nipples.  Your breasts do not become full before feedings by the fifth day after you give birth.  You feel sad and depressed.  Your baby is too sleepy to eat well.  Your baby is having trouble sleeping.   Your baby is wetting less than 3 diapers in a 24-hour period.  Your baby has less than 3 stools in a 24-hour period.  Your baby's skin or the white part of his  or her eyes becomes yellow.   Your baby is not gaining weight by 39 days of age. SEEK IMMEDIATE MEDICAL CARE IF:  Your baby is overly tired (lethargic) and does  not want to wake up and feed.  Your baby develops an unexplained fever.   This information is not intended to replace advice given to you by your health care provider. Make sure you discuss any questions you have with your health care provider.   Document Released: 12/06/2005 Document Revised: 08/27/2015 Document Reviewed: 05/30/2013 Elsevier Interactive Patient Education 2016 [employer]. Iron-Rich Diet Iron is a mineral that helps your body to produce hemoglobin. Hemoglobin is a protein in your red blood cells that carries oxygen to your body's tissues. Eating too little iron may cause you to feel weak and tired, and it can increase your risk for infection. Eating enough iron is necessary for your body's metabolism, muscle function, and nervous system. Iron is naturally found in many foods. It can also be added to foods or fortified in foods. There are two types of dietary iron:  Heme iron. Heme iron is absorbed by the body more easily than nonheme iron. Heme iron is found in meat, poultry, and fish.  Nonheme iron. Nonheme iron is found in dietary supplements, iron-fortified grains, beans, and vegetables. You may need to follow an iron-rich diet if:  You have been diagnosed with iron deficiency or iron-deficiency anemia.  You have a condition that prevents you from absorbing dietary iron, such as:  Infection in your intestines.  Celiac disease. This involves long-lasting (chronic) inflammation of your intestines.  You do not eat enough iron.  You eat a diet that is high in foods that impair iron absorption.  You have lost a lot of blood.  You have heavy bleeding during your menstrual cycle.  You are pregnant. WHAT IS MY PLAN? Your health care provider may help you to determine how much iron you need per day based on your condition. Generally, when a person consumes sufficient amounts of iron in the diet, the following iron needs are met:  Men.  42-67 years old: 11 mg per  day.  80-45 years old: 8 mg per day.  Women.   78-58 years old: 15 mg per day.  9-24 years old: 18 mg per day.  Over 76 years old: 8 mg per day.  Pregnant women: 27 mg per day.  Breastfeeding women: 9 mg per day. WHAT DO I NEED TO KNOW ABOUT AN IRON-RICH DIET?  Eat fresh fruits and vegetables that are high in vitamin C along with foods that are high in iron. This will help increase the amount of iron that your body absorbs from food, especially with foods containing nonheme iron. Foods that are high in vitamin C include oranges, peppers, tomatoes, and mango.  Take iron supplements only as directed by your health care provider. Overdose of iron can be life-threatening. If you were prescribed iron supplements, take them with orange juice or a vitamin C supplement.  Cook foods in pots and pans that are made from iron.   Eat nonheme iron-containing foods alongside foods that are high in heme iron. This helps to improve your iron absorption.   Certain foods and drinks contain compounds that impair iron absorption. Avoid eating these foods in the same meal as iron-rich foods or with iron supplements. These include:  Coffee, black tea, and red wine.  Milk, dairy products, and foods that are high in calcium.  Beans, soybeans, and peas.  Whole grains.  When eating foods that contain both nonheme iron and compounds that impair iron absorption, follow these tips to absorb iron better.   Soak beans overnight before cooking.  Soak whole grains overnight and drain them before using.  Ferment flours before baking, such as using yeast in bread dough. WHAT FOODS CAN I EAT? Grains Iron-fortified breakfast cereal. Iron-fortified whole-wheat bread. Enriched rice. Sprouted grains. Vegetables Spinach. Potatoes with skin. Green peas. Broccoli. Red and green bell peppers. Fermented vegetables. Fruits Prunes. Raisins. Oranges. Strawberries. Mango. Grapefruit. Meats and Other Protein  Sources Beef liver. Oysters. Beef. Shrimp. [place]. Chicken. [place]. Sardines. Chickpeas. Nuts. Tofu. Beverages Tomato juice. Fresh orange juice. Prune juice. Hibiscus tea. Fortified instant breakfast shakes. Condiments Tahini. Fermented soy sauce. Sweets and Desserts Black-strap molasses.  Other Wheat germ. The items listed above may not be a complete list of recommended foods or beverages. Contact your dietitian for more options. WHAT FOODS ARE NOT RECOMMENDED? Grains Whole grains. Bran cereal. Bran flour. Oats. Vegetables Artichokes. Brussels sprouts. Kale. Fruits Blueberries. Raspberries. Strawberries. Figs. Meats and Other Protein Sources Soybeans. Products made from soy protein. Dairy Milk. Cream. Cheese. Yogurt. Cottage cheese. Beverages Coffee. Black tea. Red wine. Sweets and Desserts Cocoa. Chocolate. Ice cream. Other Basil. Oregano. Parsley. The items listed above may not be a complete list of foods and beverages to avoid. Contact your dietitian for more information.   This information is not intended to replace advice given to you by your health care provider. Make sure you discuss any questions you have with your health care provider.   Document Released: 07/20/2005 Document Revised: 12/27/2014 Document Reviewed: 07/03/2014 Elsevier Interactive Patient Education 2016 [employer]. Postpartum Depression and Baby Blues The postpartum period begins right after the birth of a baby. During this time, there is often a great amount of joy and excitement. It is also a time of many changes in the life of the parents. Regardless of how many times a mother gives birth, each child brings new challenges and dynamics to the family. It is not unusual to have feelings of excitement along with confusing shifts in moods, emotions, and thoughts. All mothers are at risk of developing postpartum depression or the "baby blues." These mood changes can occur right after giving  birth, or they may occur many months after giving birth. The baby blues or postpartum depression can be mild or severe. Additionally, postpartum depression can go away rather quickly, or it can be a long-term condition.  CAUSES Raised hormone levels and the rapid drop in those levels are thought to be a main cause of postpartum depression and the baby blues. A number of hormones change during and after pregnancy. Estrogen and progesterone usually decrease right after the delivery of your baby. The levels of thyroid hormone and various cortisol steroids also rapidly drop. Other factors that play a role in these mood changes include major life events and genetics.  RISK FACTORS If you have any of the following risks for the baby blues or postpartum depression, know what symptoms to watch out for during the postpartum period. Risk factors that may increase the likelihood of getting the baby blues or postpartum depression include:  Having a personal or family history of depression.   Having depression while being pregnant.   Having premenstrual mood issues or mood issues related to oral contraceptives.  Having a lot of life stress.   Having marital conflict.   Lacking a social support network.   Having a baby with special  baby with special needs.   Having health problems, such as diabetes.  SIGNS AND SYMPTOMS Symptoms of baby blues include:  Brief changes in mood, such as going from extreme happiness to sadness.  Decreased concentration.   Difficulty sleeping.   Crying spells, tearfulness.   Irritability.   Anxiety.  Symptoms of postpartum depression typically begin within the first month after giving birth. These symptoms include:  Difficulty sleeping or excessive sleepiness.   Marked weight loss.   Agitation.   Feelings of worthlessness.   Lack of interest in activity or food.  Postpartum psychosis is a very serious condition and can be dangerous. Fortunately, it is rare.  Displaying any of the following symptoms is cause for immediate medical attention. Symptoms of postpartum psychosis include:   Hallucinations and delusions.   Bizarre or disorganized behavior.   Confusion or disorientation.  DIAGNOSIS  A diagnosis is made by an evaluation of your symptoms. There are no medical or lab tests that lead to a diagnosis, but there are various questionnaires that a health care provider may use to identify those with the baby blues, postpartum depression, or psychosis. Often, a screening tool called the [place] Postnatal Depression Scale is used to diagnose depression in the postpartum period.  TREATMENT The baby blues usually goes away on its own in 1-2 weeks. Social support is often all that is needed. You will be encouraged to get adequate sleep and rest. Occasionally, you may be given medicines to help you sleep.  Postpartum depression requires treatment because it can last several months or longer if it is not treated. Treatment may include individual or group therapy, medicine, or both to address any social, physiological, and psychological factors that may play a role in the depression. Regular exercise, a healthy diet, rest, and social support may also be strongly recommended.  Postpartum psychosis is more serious and needs treatment right away. Hospitalization is often needed. HOME CARE INSTRUCTIONS  Get as much rest as you can. Nap when the baby sleeps.   Exercise regularly. Some women find yoga and walking to be beneficial.   Eat a balanced and nourishing diet.   Do little things that you enjoy. Have a cup of tea, take a bubble bath, read your favorite magazine, or listen to your favorite music.  Avoid alcohol.   Ask for help with household chores, cooking, grocery shopping, or running errands as needed. Do not try to do everything.   Talk to people close to you about how you are feeling. Get support from your partner, family members, friends,  or other new moms.  Try to stay positive in how you think. Think about the things you are grateful for.   Do not spend a lot of time alone.   Only take over-the-counter or prescription medicine as directed by your health care provider.  Keep all your postpartum appointments.   Let your health care provider know if you have any concerns.  SEEK MEDICAL CARE IF: You are having a reaction to or problems with your medicine. SEEK IMMEDIATE MEDICAL CARE IF:  You have suicidal feelings.   You think you may harm the baby or someone else. MAKE SURE YOU:  Understand these instructions.  Will watch your condition.  Will get help right away if you are not doing well or get worse.   This information is not intended to replace advice given to you by your health care provider. Make sure you discuss any questions you have with your health care  Document Released: 09/09/2004 Document Revised: 12/11/2013 Document Reviewed: 09/17/2013 Elsevier Interactive Patient Education [employer]. Norethindrone tablets (contraception) What is this medicine? NORETHINDRONE (nor eth IN drone) is an oral contraceptive. The product contains a female hormone known as a progestin. It is used to prevent pregnancy. This medicine may be used for other purposes; ask your health care provider or pharmacist if you have questions. What should I tell my health care provider before I take this medicine? They need to know if you have any of these conditions: -blood vessel disease or blood clots -breast, cervical, or vaginal cancer -diabetes -heart disease -kidney disease -liver disease -mental depression -migraine -seizures -stroke -vaginal bleeding -an unusual or allergic reaction to norethindrone, other medicines, foods, dyes, or preservatives -pregnant or trying to get pregnant -breast-feeding How should I use this medicine? Take this medicine by mouth with a glass of water. You may take  it with or without food. Follow the directions on the prescription label. Take this medicine at the same time each day and in the order directed on the package. Do not take your medicine more often than directed. Contact your pediatrician regarding the use of this medicine in children. Special care may be needed. This medicine has been used in female children who have started having menstrual periods. A patient package insert for the product will be given with each prescription and refill. Read this sheet carefully each time. The sheet may change frequently. Overdosage: If you think you have taken too much of this medicine contact a poison control center or emergency room at once. NOTE: This medicine is only for you. Do not share this medicine with others. What if I miss a dose? Try not to miss a dose. Every time you miss a dose or take a dose late your chance of pregnancy increases. When 1 pill is missed (even if only 3 hours late), take the missed pill as soon as possible and continue taking a pill each day at the regular time (use a back up method of birth control for the next 48 hours). If more than 1 dose is missed, use an additional birth control method for the rest of your pill pack until menses occurs. Contact your health care professional if more than 1 dose has been missed. What may interact with this medicine? Do not take this medicine with any of the following medications: -amprenavir or fosamprenavir -bosentan This medicine may also interact with the following medications: -antibiotics or medicines for infections, especially rifampin, rifabutin, rifapentine, and griseofulvin, and possibly penicillins or tetracyclines -aprepitant -barbiturate medicines, such as phenobarbital -carbamazepine -felbamate -modafinil -oxcarbazepine -phenytoin -ritonavir or other medicines for HIV infection or AIDS -St. John's wort -topiramate This list may not describe all possible interactions. Give  your health care provider a list of all the medicines, herbs, non-prescription drugs, or dietary supplements you use. Also tell them if you smoke, drink alcohol, or use illegal drugs. Some items may interact with your medicine. What should I watch for while using this medicine? Visit your doctor or health care professional for regular checks on your progress. You will need a regular breast and pelvic exam and Pap smear while on this medicine. Use an additional method of birth control during the first cycle that you take these tablets. If you have any reason to think you are pregnant, stop taking this medicine right away and contact your doctor or health care professional. If you are taking this medicine for hormone related problems, it may  take several cycles of use to see improvement in your condition. This medicine does not protect you against HIV infection (AIDS) or any other sexually transmitted diseases. What side effects may I notice from receiving this medicine? Side effects that you should report to your doctor or health care professional as soon as possible: -breast tenderness or discharge -pain in the abdomen, chest, groin or leg -severe headache -skin rash, itching, or hives -sudden shortness of breath -unusually weak or tired -vision or speech problems -yellowing of skin or eyes Side effects that usually do not require medical attention (report to your doctor or health care professional if they continue or are bothersome): -changes in sexual desire -change in menstrual flow -facial hair growth -fluid retention and swelling -headache -irritability -nausea -weight gain or loss This list may not describe all possible side effects. Call your doctor for medical advice about side effects. You may report side effects to FDA at 1-800-FDA-1088. Where should I keep my medicine? Keep out of the reach of children. Store at room temperature between 15 and 30 degrees C (59 and 86 degrees  F). Throw away any unused medicine after the expiration date. NOTE: This sheet is a summary. It may not cover all possible information. If you have questions about this medicine, talk to your doctor, pharmacist, or health care provider.    2016, Elsevier/Gold Standard. (2012-08-25 16:41:35)

## 2016-03-01 NOTE — Lactation Note (Signed)
This note was copied from a baby's chart. Lactation Consultation Note Experienced BF mom had an emotional crying spell d/t baby cluster feeding and mom very tired and her stomach cramping badly. Mom hasn't been taking percocet, encouraged mom to take it if severe cramping cont. Hand expression demonstrated w/5ml colostrum obtained. Gave to baby in syring w/gloved finger. Baby is fussy. W/gloved finger gave 62ml colostrum to baby w/syring. Noted baby chomping, not able to hold good seal. Needed stimulation to get tongue under finger. Baby tends to hump tongue to back of mouth. Has upper labial lip fenulum. bottom tongue is questionable for anterior frenulum. Suck training attempted. Assisted in latch in football position. Assisted in latching, demonstrated wide flange and chin tug. Mom stated it felt much pain. Discussed proper body alignment.  Patient Name: Kendra Patton [id number] Date: 03/01/2016 Reason for consult: Follow-up assessment   Maternal Data    Feeding Feeding Type: Breast Milk Length of feed: 20 min  LATCH Score/Interventions Latch: Grasps breast easily, tongue down, lips flanged, rhythmical sucking. Intervention(s): Adjust position;Assist with latch;Breast massage;Breast compression  Audible Swallowing: A few with stimulation Intervention(s): Skin to skin;Hand expression  Type of Nipple: Everted at rest and after stimulation  Comfort (Breast/Nipple): Soft / non-tender     Hold (Positioning): Assistance needed to correctly position infant at breast and maintain latch. Intervention(s): Skin to skin;Position options;Breastfeeding basics reviewed;Support Pillows  LATCH Score: 8  Lactation Tools Discussed/Used     Consult Status Consult Status: PRN Date: 03/01/16 Follow-up type: In-patient    Theodoro Kalata 03/01/2016, 3:57 AM

## 2016-03-02 ENCOUNTER — Ambulatory Visit: Payer: BLUE CROSS/BLUE SHIELD | Admitting: Oncology

## 2016-03-06 ENCOUNTER — Inpatient Hospital Stay (HOSPITAL_COMMUNITY): Admission: RE | Admit: 2016-03-06 | Payer: BLUE CROSS/BLUE SHIELD | Source: Ambulatory Visit

## 2018-11-01 LAB — HM PAP SMEAR

## 2019-03-22 ENCOUNTER — Telehealth: Payer: Self-pay

## 2019-03-22 NOTE — Telephone Encounter (Signed)
Called patient regarding request for New Patient appointment.  Patient does not have current PCP, would like to establish.   Per patient, father of patients child recently (2 days ago) committed suicide. She is having difficulty with grieving/coping. Denies desire/intent to hurt herself or others. Number for [facility] provided to patient, plans to call today.  New patient paperwork emailed to patient.

## 2019-03-22 NOTE — Telephone Encounter (Signed)
I am sorry she is going through such a hard time. I will be happy to see her tomorrow via WebEx and do what I can to help in this hard time.   Thank you for the heads up.

## 2019-03-23 ENCOUNTER — Other Ambulatory Visit: Payer: Self-pay

## 2019-03-23 ENCOUNTER — Encounter: Payer: Self-pay | Admitting: Physician Assistant

## 2019-03-23 ENCOUNTER — Ambulatory Visit (INDEPENDENT_AMBULATORY_CARE_PROVIDER_SITE_OTHER): Payer: BLUE CROSS/BLUE SHIELD | Admitting: Clinical

## 2019-03-23 ENCOUNTER — Ambulatory Visit (INDEPENDENT_AMBULATORY_CARE_PROVIDER_SITE_OTHER): Payer: BLUE CROSS/BLUE SHIELD | Admitting: Physician Assistant

## 2019-03-23 DIAGNOSIS — F4321 Adjustment disorder with depressed mood: Secondary | ICD-10-CM

## 2019-03-23 DIAGNOSIS — F430 Acute stress reaction: Secondary | ICD-10-CM | POA: Diagnosis not present

## 2019-03-23 MED ORDER — ALBUTEROL SULFATE HFA 108 (90 BASE) MCG/ACT IN AERS: 1.0000 | INHALATION_SPRAY | Freq: Four times a day (QID) | RESPIRATORY_TRACT | 5 refills | Status: DC | PRN

## 2019-03-23 NOTE — Progress Notes (Signed)
I have discussed the procedure for the virtual visit with the patient who has given consent to proceed with assessment and treatment.    S , CMA     

## 2019-03-23 NOTE — Progress Notes (Signed)
Virtual Visit via Video Note I connected with Kendra Patton on 03/23/19 at 10:00 AM EDT by a video enabled telemedicine application and verified that I am speaking with the correct person using two identifiers.   I discussed the limitations of evaluation and management by telemedicine and the availability of in person appointments. The patient expressed understanding and agreed to proceed.  History of Present Illness: Patient present today via WebEx as a NP with acute concerns.   Patient notes a remote history of anxiety/depression along with PCD. Also issue with Bulimia in adolescence (no issue in years). Not currently on any medications. Has been doing well up until a few days ago. Notes the father of her youngest child started sending her messages, wanting to get back together. He began to note more symptoms of depression and threatening to kill himself if she did not come back to her. Notes 2 days ago he sent her a message including a picture of him holding a gun to himself. States that he was going to kill himself. Notes she was not sure the seriousness of his comments as he has done this in the past when they have not been together. She learned that he has in-fact committed suicide. She is having significant anxiety, depressed mood with increase in OCD symptoms. Is having significant feelings of guilt and wishes she could have done something differently. Notes sleep is ok overall. Sometimes sleeping too much currently. Appetite is stable. Has a good support system with her aunt and friends. Current significant other is not emotionally supportive at present.  Patient notes previously being on multiple different antidepressants and being unable to tolerate. Had been on BZDs which helped with anxiety but she does not want to be on this habit-forming medications. Is interested in counseling to start with. She denies any SI/HI. Is keeping busy taking care of her little one.  Chronic medical issues  include:  - Asthma -- albuterol inhaler PRN. Very rare use. Denies history of exacerbation. Needs refill which will be sent in today.   Observations/Objective: Patient is well-developed, well-nourished in no acute distress but tearful throughout visit. Resting comfortably in chair at home.  Head is normocephalic, atraumatic.  No labored breathing.  Speech is clear and coherent with logical content.  Anxious and Sad affect. Patient is alert and oriented x 3.  Assessment and Plan: 1. Grief reaction Declining medication. Agrees to counseling which needs to be set up ASAP. Contacted Dr. Gaynell Face with [facility] who is able to work in patient today at [facility] before the weekend. Patient is able to attend this. Information given to PsyD so she can contact patient directly. Discussed home stress relief tactics. Rely on support system. Will follow-up with her Monday myslef to reassess. May need to get Psychiatry on board for medications if she changes her mind about pharmacotherapy, giving her past issues.  Follow Up Instructions: Follow-up 1 week via Video Visit. Working on having her get in with counselor for Video Visit starting Monday if possible.  MyChart code and instructions generated and sent via text to her so she can get this set up for messaging purposes.  Emergency Care reviewed.   I discussed the assessment and treatment plan with the patient. The patient was provided an opportunity to ask questions and all were answered. The patient agreed with the plan and demonstrated an understanding of the instructions.   The patient was advised to call back or seek an in-person evaluation if the symptoms  worsen or if the condition fails to improve as anticipated.   Leeanne Rio, PA-C

## 2019-04-01 ENCOUNTER — Ambulatory Visit: Payer: Self-pay | Admitting: Clinical

## 2019-04-08 ENCOUNTER — Ambulatory Visit (INDEPENDENT_AMBULATORY_CARE_PROVIDER_SITE_OTHER): Payer: BLUE CROSS/BLUE SHIELD | Admitting: Clinical

## 2019-04-08 DIAGNOSIS — F430 Acute stress reaction: Secondary | ICD-10-CM | POA: Diagnosis not present

## 2019-04-21 ENCOUNTER — Ambulatory Visit: Payer: Self-pay | Admitting: Clinical

## 2019-05-25 ENCOUNTER — Telehealth: Payer: Self-pay | Admitting: Physician Assistant

## 2019-05-25 DIAGNOSIS — B009 Herpesviral infection, unspecified: Secondary | ICD-10-CM

## 2019-05-25 MED ORDER — VALACYCLOVIR HCL 1 G PO TABS: 2000.0000 mg | ORAL_TABLET | Freq: Two times a day (BID) | ORAL | 0 refills | Status: DC

## 2019-05-25 NOTE — Telephone Encounter (Signed)
Pt states that she has a cold sore on her lip and asking if Einar Pheasant would call in valtrex to [facility] [place]. Offered pt a VV, she stated that she did not need an appt that she knows what this is and just needs an Rx. 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

## 2019-05-25 NOTE — Addendum Note (Signed)
Addended by: Leonidas Romberg on: 05/25/2019 12:06 PM   Modules accepted: Orders

## 2019-05-25 NOTE — Telephone Encounter (Signed)
Ok to send in Valtrex 1 gm -- take 2 tablets BID for 1 day. Quantity 4 with 0 refills. If not resolving will need appointment. Also I will typically not prescribe medicine without assessing patient first. I am making an exception giving her noted history of this in chart and current COVID pandemic.

## 2019-05-25 NOTE — Telephone Encounter (Signed)
In reviewing patient chart. Her Gyn prescribed Valtrex 1 GM q 12 hrs for 5 days.  Please advise

## 2019-05-25 NOTE — Telephone Encounter (Signed)
Advised patient that rx for Valtrex was sent to the pharmacy. If symptoms do not improve to schedule an appointment. She is agreeable.

## 2019-07-20 ENCOUNTER — Encounter: Payer: Self-pay | Admitting: Physician Assistant

## 2019-07-20 ENCOUNTER — Ambulatory Visit (INDEPENDENT_AMBULATORY_CARE_PROVIDER_SITE_OTHER): Payer: BC Managed Care – PPO | Admitting: Physician Assistant

## 2019-07-20 ENCOUNTER — Other Ambulatory Visit: Payer: Self-pay

## 2019-07-20 DIAGNOSIS — L301 Dyshidrosis [pompholyx]: Secondary | ICD-10-CM | POA: Diagnosis not present

## 2019-07-20 MED ORDER — MOMETASONE FUROATE 0.1 % EX OINT: TOPICAL_OINTMENT | Freq: Every day | CUTANEOUS | 0 refills | Status: DC

## 2019-07-20 NOTE — Patient Instructions (Signed)
Instructions to be sent to MyChart.  General skin care measures aimed at reducing skin irritation and restoring the skin barrier include: ?Using lukewarm water and soap-free cleansers to wash hands ?Drying hands thoroughly after washing ?Applying emollients (eg, petroleum jelly) immediately after hand drying and as often as possible  ?Wearing cotton gloves under vinyl or other nonlatex gloves when performing wet work ?Removing rings and watches and bracelets before wet work ?Wearing protective gloves in cold weather  ?Wearing task-specific gloves for frictional exposures (eg, gardening, carpentry) ?Avoiding exposure to irritants (eg, detergents, solvents, hair lotions or dyes, acidic foods [eg, citrus fruit])

## 2019-07-20 NOTE — Progress Notes (Signed)
I have discussed the procedure for the virtual visit with the patient who has given consent to proceed with assessment and treatment.    S , CMA     

## 2019-07-20 NOTE — Progress Notes (Signed)
   Virtual Visit via Video   I connected with patient on 07/20/19 at 10:00 AM EDT by a video enabled telemedicine application and verified that I am speaking with the correct person using two identifiers.  Location patient: Home Location provider: [facility] Office Persons participating in the virtual visit: Patient, Provider, [place] ([name])  I discussed the limitations of evaluation and management by telemedicine and the availability of in person appointments. The patient expressed understanding and agreed to proceed.  Subjective:   HPI:   Patient presents via [facility] today c/o flare up of eczema of hands. Has longstanding history of Dishidrotic eczema. Notes with increase in hand washing, sanitizer use and chlorine from recent pool swimming.  Notes very dry, itchy and scaly hands with palmar blisters. Denies fever, chills. Denies change to soaps, lotions or detergents. Denies rash elsewhere.   ROS:   See pertinent positives and negatives per HPI.  Patient Active Problem List   Diagnosis Date Noted  . Vaginal delivery 02/29/2016  . Anxiety - dx'd at age 85 02/28/2016  . Eczema 02/28/2016  . IBS (irritable bowel syndrome) 02/28/2016  . Scoliosis 02/28/2016  . Seasonal allergies 02/28/2016  . Allergy history, drug -- Compazine (dystonic rxn; seizures) 02/27/2016  . High-risk pregnancy supervision 10/27/2015  . Von Willebrand disease, type I ([place]) 07/11/2015  . 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 Asthma 07/11/2015    Social History   Tobacco Use  . Smoking status: Former Smoker    Quit date: 05/12/2015    Years since quitting: 4.1  . Smokeless tobacco: Never Used  Substance Use Topics  . Alcohol use: Yes    Alcohol/week: 0.0 standard drinks    Comment: 2-3 beers per day    Current Outpatient Medications:  .  albuterol (PROVENTIL HFA;VENTOLIN HFA) 108 (90 Base) MCG/ACT inhaler, Inhale 1 puff into the lungs every 6 (six) hours as needed for wheezing or shortness of  breath., Disp: 6.7 g, Rfl: 5 .  Calcium-Magnesium-Zinc 500-250-12.5 MG TABS, Take 1 tablet by mouth daily., Disp: , Rfl:  .  vitamin C (ASCORBIC ACID) 500 MG tablet, Take 1 tablet by mouth daily., Disp: , Rfl:  .  mometasone (ELOCON) 0.1 % ointment, Apply topically daily., Disp: 45 g, Rfl: 0 .  valACYclovir (VALTREX) 1000 MG tablet, Take 2 tablets (2,000 mg total) by mouth 2 (two) times daily. (Patient not taking: Reported on 07/20/2019), Disp: 4 tablet, Rfl: 0  Allergies  Allergen Reactions  . Aspirin Other (See Comments)    "Thins bloods too much"  . Compazine [Prochlorperazine Edisylate]     Dystonic.    Objective:   There were no vitals taken for this visit.  Patient is well-developed, well-nourished in no acute distress.  Resting comfortably at home.  Head is normocephalic, atraumatic.  No labored breathing.  Speech is clear and coherent with logical contest.  Patient is alert and oriented at baseline.  Areas of xerotic skin noted of hands, some palmar vesicles noted, consistent with pomphylox  Assessment and Plan:   1. Pompholyx dermatitis Start occlusive dressing of Mometasone nightly. Also to use am non-occlusive for total of BID dosing. Supportive measures reviewed with patient.     Leeanne Rio, PA-C 07/20/2019

## 2019-11-06 ENCOUNTER — Encounter: Payer: Self-pay | Admitting: Physician Assistant

## 2019-11-06 ENCOUNTER — Ambulatory Visit (INDEPENDENT_AMBULATORY_CARE_PROVIDER_SITE_OTHER): Payer: BC Managed Care – PPO | Admitting: Physician Assistant

## 2019-11-06 ENCOUNTER — Other Ambulatory Visit: Payer: Self-pay

## 2019-11-06 VITALS — Temp 98.6°F | Wt 155.0 lb

## 2019-11-06 DIAGNOSIS — L989 Disorder of the skin and subcutaneous tissue, unspecified: Secondary | ICD-10-CM | POA: Diagnosis not present

## 2019-11-06 DIAGNOSIS — F988 Other specified behavioral and emotional disorders with onset usually occurring in childhood and adolescence: Secondary | ICD-10-CM | POA: Diagnosis not present

## 2019-11-06 MED ORDER — AMPHETAMINE-DEXTROAMPHET ER 15 MG PO CP24: 15.0000 mg | ORAL_CAPSULE | ORAL | 0 refills | Status: DC

## 2019-11-06 NOTE — Progress Notes (Signed)
Virtual Visit via Video   I connected with patient on 11/06/19 at  2:30 PM EST by a video enabled telemedicine application and verified that I am speaking with the correct person using two identifiers.  Location patient: Home Location provider: [facility], Office Persons participating in the virtual visit: Patient, Provider, [place] ([name])  I discussed the limitations of evaluation and management by telemedicine and the availability of in person appointments. The patient expressed understanding and agreed to proceed.  Subjective:   HPI:   Patient presents via Doxy.Me for c/o ADD. Has become more of an issue lately trying to balance helping her children with online school and at-home tasks. Endorses difficulty staying focussed, completing tasks, easily distracted, and impulsiveness. Notes loss of motivation due to repetitive inability to  focus. Has been more irritable lately. Was on Ritalin when she was younger and Concerta and Adderall during her middle school and college years, states she tolerated the Ritalin and Adderall well, did not notice symptom improvement with Concerta. Stopped the medication nine years ago when she had her first child. Denies depressive mood, anhedonia, chest pain, palpitations, changes in bowel habits. Reports history of anxiety with some occasional panic attacks, none recently.   ROS:   See pertinent positives and negatives per HPI.  Patient Active Problem List   Diagnosis Date Noted  . Vaginal delivery 02/29/2016  . Anxiety - dx'd at age 16 02/28/2016  . Eczema 02/28/2016  . IBS (irritable bowel syndrome) 02/28/2016  . Scoliosis 02/28/2016  . Seasonal allergies 02/28/2016  . Allergy history, drug -- Compazine (dystonic rxn; seizures) 02/27/2016  . High-risk pregnancy supervision 10/27/2015  . Von Willebrand disease, type I ([place]) 07/11/2015  . 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 Asthma 07/11/2015    Social History   Tobacco Use  . Smoking  status: Former Smoker    Quit date: 05/12/2015    Years since quitting: 4.4  . Smokeless tobacco: Never Used  Substance Use Topics  . Alcohol use: Yes    Alcohol/week: 0.0 standard drinks    Comment: 2-3 beers per day    Current Outpatient Medications:  .  albuterol (PROVENTIL HFA;VENTOLIN HFA) 108 (90 Base) MCG/ACT inhaler, Inhale 1 puff into the lungs every 6 (six) hours as needed for wheezing or shortness of breath., Disp: 6.7 g, Rfl: 5 .  Calcium-Magnesium-Zinc 500-250-12.5 MG TABS, Take 1 tablet by mouth daily., Disp: , Rfl:  .  vitamin C (ASCORBIC ACID) 500 MG tablet, Take 1 tablet by mouth daily., Disp: , Rfl:  .  amphetamine-dextroamphetamine (ADDERALL XR) 15 MG 24 hr capsule, Take 1 capsule by mouth every morning., Disp: 30 capsule, Rfl: 0  Allergies  Allergen Reactions  . Aspirin Other (See Comments)    "Thins bloods too much"  . Compazine [Prochlorperazine Edisylate]     Dystonic.    Objective:   Temp 98.6 F (37 C) (Oral)   Wt 155 lb (70.3 kg)   BMI 25.40 kg/m   Patient is well-developed, well-nourished in no acute distress.  Resting comfortably at home.  Head is normocephalic, atraumatic.  No labored breathing.  Speech is clear and coherent with logical content.  Patient is alert and oriented at baseline.   Assessment and Plan:   1. Attention deficit disorder (ADD) without hyperactivity Restart Adderall XR 15 mg once daily. Supportive measures reviewed. Follow-up visit scheduled for 3 weeks via video visit. Return sooner if needed. - amphetamine-dextroamphetamine (ADDERALL XR) 15 MG 24 hr capsule; Take 1 capsule by  mouth every morning.  Dispense: 30 capsule; Refill: 0  2. Non-healing skin lesion Of nose x 10 years. Changing recently. This was mentioned at end of visit. Patient requesting referral back to Dermatology. Referral placed.  - Ambulatory referral to Dermatology    Leeanne Rio, PA-C 11/06/2019

## 2019-11-06 NOTE — Patient Instructions (Addendum)
Instructions sent to MyChart.  Please start the Adderall XR once daily.  Keep hydrated. Limit caffeine. We will follow-up in 3 weeks via Video Visit as scheduled. Return or contact me sooner if needed.

## 2019-11-06 NOTE — Progress Notes (Signed)
I have discussed the procedure for the virtual visit with the patient who has given consent to proceed with assessment and treatment.    S , CMA     

## 2019-11-27 ENCOUNTER — Other Ambulatory Visit: Payer: Self-pay

## 2019-11-27 ENCOUNTER — Encounter: Payer: Self-pay | Admitting: Physician Assistant

## 2019-11-27 ENCOUNTER — Ambulatory Visit (INDEPENDENT_AMBULATORY_CARE_PROVIDER_SITE_OTHER): Payer: BC Managed Care – PPO | Admitting: Physician Assistant

## 2019-11-27 DIAGNOSIS — Z831 Family history of other infectious and parasitic diseases: Secondary | ICD-10-CM

## 2019-11-27 DIAGNOSIS — F988 Other specified behavioral and emotional disorders with onset usually occurring in childhood and adolescence: Secondary | ICD-10-CM

## 2019-11-27 MED ORDER — AMPHETAMINE-DEXTROAMPHET ER 20 MG PO CP24: 20.0000 mg | ORAL_CAPSULE | ORAL | 0 refills | Status: DC

## 2019-11-27 NOTE — Patient Instructions (Addendum)
Instructions sent to my chart  Please start the new dose of Adderall once daily. We will continue on the 20 mg dose over the next month. If you note symptoms or not improving, or if you notice any side effect of medication, please give [place] a call. As long as things are going well, we will follow-up in 2 months.  This can be a video visit   For the pinworms, make sure everyone has a repeat treatment 2 weeks after the first treatment.  Handwashing is key.  I do recommend that you wash all bed linens.  Make sure to keep your little ones nails trimmed to help prevent any spread of this infection until we make sure it is adequately treated.  If you start to have symptoms yourself, please give [place] a call.  Hang in there!

## 2019-11-27 NOTE — Progress Notes (Signed)
Virtual Visit via Video   I connected with patient on 11/27/19 at  1:30 PM EST by a video enabled telemedicine application and verified that I am speaking with the correct person using two identifiers.  Location patient: Home Location provider: [facility] Pat[facility], Office Persons participating in the virtual visit: Patient, Provider, [place] (Kendra Patton)  I discussed the limitations of evaluation and management by telemedicine and the availability of in person appointments. The patient expressed understanding and agreed to proceed.  Subjective:   HPI:   Patient presents via doxy.need today to follow-up for ADD.  Last visit patient was restarted on Adderall XR 15 mg once daily.  Patient endorses taking medication as directed. Is tolerating well without side effect. Still notes room for improvement with focus.   Patient also notes her youngest childwas recently diagnosed with pinworms. Diagnosed a week ago with pediatrician. Was treated and her family was also treated. She is not having any symptoms and daughter is not having any recurrence of symptoms since treatment.   ROS:   See pertinent positives and negatives per HPI.  Patient Active Problem List   Diagnosis Date Noted  . Vaginal delivery 02/29/2016  . Anxiety - dx'd at age 69 02/28/2016  . Eczema 02/28/2016  . IBS (irritable bowel syndrome) 02/28/2016  . Scoliosis 02/28/2016  . Seasonal allergies 02/28/2016  . Allergy history, drug -- Compazine (dystonic rxn; seizures) 02/27/2016  . High-risk pregnancy supervision 10/27/2015  . Von Willebrand disease, type I ([place]) 07/11/2015  . 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 Asthma 07/11/2015    Social History   Tobacco Use  . Smoking status: Former Smoker    Quit date: 05/12/2015    Years since quitting: 4.5  . Smokeless tobacco: Never Used  Substance Use Topics  . Alcohol use: Yes    Alcohol/week: 0.0 standard drinks    Comment: 2-3 beers per day    Current Outpatient  Medications:  .  albuterol (PROVENTIL HFA;VENTOLIN HFA) 108 (90 Base) MCG/ACT inhaler, Inhale 1 puff into the lungs every 6 (six) hours as needed for wheezing or shortness of breath., Disp: 6.7 g, Rfl: 5 .  Calcium-Magnesium-Zinc 500-250-12.5 MG TABS, Take 1 tablet by mouth daily., Disp: , Rfl:  .  vitamin C (ASCORBIC ACID) 500 MG tablet, Take 1 tablet by mouth daily., Disp: , Rfl:  .  amphetamine-dextroamphetamine (ADDERALL XR) 20 MG 24 hr capsule, Take 1 capsule (20 mg total) by mouth every morning., Disp: 30 capsule, Rfl: 0  Allergies  Allergen Reactions  . Aspirin Other (See Comments)    "Thins bloods too much"  . Compazine [Prochlorperazine Edisylate]     Dystonic.    Objective:   There were no vitals taken for this visit.  Patient is well-developed, well-nourished in no acute distress.  Resting comfortably at home.  Head is normocephalic, atraumatic.  No labored breathing.  Speech is clear and coherent with logical content.  Patient is alert and oriented at baseline.   Assessment and Plan:   1. Attention deficit disorder (ADD) without hyperactivity Tolerating Adderall well at 15 mg dose.  Is still subtherapeutic for patient.  Will increase to 20 mg once daily.  As long as symptoms are doing well, we will plan on follow-up in 2 months.  This can be via video visit or in office.  At next in the office appointment we will update a controlled substance contract for the patient. - amphetamine-dextroamphetamine (ADDERALL XR) 20 MG 24 hr capsule; Take 1 capsule (20 mg  total) by mouth every morning.  Dispense: 30 capsule; Refill: 0  2. Family history of pinworm infection Patient asymptomatic.  Daughter had symptoms.  Whole family has been treated.  Due to repeat treatment in 1.5 weeks.  Reviewed supportive measures and hygiene practices both for her and her daughter.  Recommend trimming her daughter's nails.  This will help prevent spread of infection or recurrent infection. Kendra Rio, PA-C 11/27/2019

## 2019-11-27 NOTE — Progress Notes (Signed)
I have discussed the procedure for the virtual visit with the patient who has given consent to proceed with assessment and treatment.    S , CMA     

## 2019-12-24 DIAGNOSIS — E282 Polycystic ovarian syndrome: Secondary | ICD-10-CM | POA: Insufficient documentation

## 2019-12-25 ENCOUNTER — Other Ambulatory Visit: Payer: Self-pay | Admitting: Physician Assistant

## 2019-12-25 DIAGNOSIS — F988 Other specified behavioral and emotional disorders with onset usually occurring in childhood and adolescence: Secondary | ICD-10-CM

## 2019-12-25 MED ORDER — AMPHETAMINE-DEXTROAMPHET ER 20 MG PO CP24: 20.0000 mg | ORAL_CAPSULE | ORAL | 0 refills | Status: DC

## 2019-12-25 NOTE — Telephone Encounter (Signed)
Called patient but no VM set up.

## 2019-12-25 NOTE — Telephone Encounter (Signed)
Rx refilled. The new medication from GYN (Spironolactone) is ok with her current regimen.

## 2019-12-25 NOTE — Telephone Encounter (Signed)
Pt called in asking for a refill on the Adderall 20 mg tabs, [facility] [place] She will be out on 12/27/2018.   She was prescribed spironolactone 50mg  once daily by her OBGYN. Pt wanted to make [facility] aware of this to be sure it didn't interfere with any of her other medications.

## 2019-12-25 NOTE — Telephone Encounter (Signed)
Adderall last rx 11/27/19 #30 will be due 12/28/19

## 2019-12-26 ENCOUNTER — Other Ambulatory Visit: Payer: Self-pay | Admitting: Emergency Medicine

## 2019-12-26 NOTE — Telephone Encounter (Signed)
Advised patient of medication is ok with her current medication regimen. Adderall refilled for 12/28/19.

## 2020-01-24 ENCOUNTER — Telehealth: Payer: Self-pay | Admitting: *Deleted

## 2020-01-24 DIAGNOSIS — F988 Other specified behavioral and emotional disorders with onset usually occurring in childhood and adolescence: Secondary | ICD-10-CM

## 2020-01-24 MED ORDER — AMPHETAMINE-DEXTROAMPHET ER 20 MG PO CP24: 20.0000 mg | ORAL_CAPSULE | ORAL | 0 refills | Status: DC

## 2020-01-24 NOTE — Telephone Encounter (Signed)
Medication refill sent to pharmacy.  Patient is due for follow-up in the next month.

## 2020-01-24 NOTE — Telephone Encounter (Signed)
Pt requesting ref Rx Adderall XR  Last RF on 12/25/2019 Last OV 11/26/2020

## 2020-01-24 NOTE — Telephone Encounter (Signed)
Unable to LVM user unavailable

## 2020-02-22 ENCOUNTER — Other Ambulatory Visit: Payer: Self-pay | Admitting: Physician Assistant

## 2020-02-22 DIAGNOSIS — F988 Other specified behavioral and emotional disorders with onset usually occurring in childhood and adolescence: Secondary | ICD-10-CM

## 2020-02-22 MED ORDER — AMPHETAMINE-DEXTROAMPHET ER 20 MG PO CP24: 20.0000 mg | ORAL_CAPSULE | ORAL | 0 refills | Status: DC

## 2020-02-22 NOTE — Telephone Encounter (Signed)
Pt called in asking for a refill on the Adderall, [facility] [place]

## 2020-02-22 NOTE — Telephone Encounter (Signed)
Adderall last rx 01/24/20 #30 LOV: 11/27/19 ADD  CSC: none

## 2020-02-25 ENCOUNTER — Telehealth: Payer: Self-pay | Admitting: Physician Assistant

## 2020-02-25 NOTE — Telephone Encounter (Signed)
Pt called in stating that she has pinworms she wanted to know if there was something so could take OTC or if something could be called in for her. I did schedule her for an appt tomorrow. Please advise.

## 2020-02-25 NOTE — Telephone Encounter (Signed)
Will see patient at time of appointment.

## 2020-02-26 ENCOUNTER — Other Ambulatory Visit: Payer: Self-pay

## 2020-02-26 ENCOUNTER — Ambulatory Visit (INDEPENDENT_AMBULATORY_CARE_PROVIDER_SITE_OTHER): Payer: BC Managed Care – PPO | Admitting: Physician Assistant

## 2020-02-26 ENCOUNTER — Encounter: Payer: Self-pay | Admitting: Physician Assistant

## 2020-02-26 DIAGNOSIS — B80 Enterobiasis: Secondary | ICD-10-CM | POA: Diagnosis not present

## 2020-02-26 MED ORDER — ALBENDAZOLE 200 MG PO TABS: 400.0000 mg | ORAL_TABLET | Freq: Once | ORAL | 0 refills | Status: AC

## 2020-02-26 NOTE — Progress Notes (Signed)
Virtual Visit via Video   I connected with patient on 02/26/20 at  9:30 AM EST by a video enabled telemedicine application and verified that I am speaking with the correct person using two identifiers.  Location patient: Home Location provider: [facility], Office Persons participating in the virtual visit: Patient, Provider, [place] ([name])  I discussed the limitations of evaluation and management by telemedicine and the availability of in person appointments. The patient expressed understanding and agreed to proceed.  Subjective:   HPI:   Patient presents via Doxy.Me today with concern of pinworm infection.  Notes her daughter currently has them, initially having anal itching before mother took a look at night, noting the worm. Notes she has not had any significant anal itching but she did use the bathroom last night and noted a small pinworm on the stool. Has had previous pinworm infection and notes this seemed identical. Denies fever, chills, malaise or fatigue.   ROS:   See pertinent positives and negatives per HPI.  Patient Active Problem List   Diagnosis Date Noted  . Vaginal delivery 02/29/2016  . Anxiety - dx'd at age 82 02/28/2016  . Eczema 02/28/2016  . IBS (irritable bowel syndrome) 02/28/2016  . Scoliosis 02/28/2016  . Seasonal allergies 02/28/2016  . Allergy history, drug -- Compazine (dystonic rxn; seizures) 02/27/2016  . High-risk pregnancy supervision 10/27/2015  . Von Willebrand disease, type I ([place]) 07/11/2015  . 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 Asthma 07/11/2015    Social History   Tobacco Use  . Smoking status: Former Smoker    Quit date: 05/12/2015    Years since quitting: 4.7  . Smokeless tobacco: Never Used  Substance Use Topics  . Alcohol use: Yes    Alcohol/week: 0.0 standard drinks    Comment: 2-3 beers per day    Current Outpatient Medications:  .  albuterol (PROVENTIL HFA;VENTOLIN HFA) 108 (90 Base) MCG/ACT inhaler, Inhale 1 puff  into the lungs every 6 (six) hours as needed for wheezing or shortness of breath., Disp: 6.7 g, Rfl: 5 .  amphetamine-dextroamphetamine (ADDERALL XR) 20 MG 24 hr capsule, Take 1 capsule (20 mg total) by mouth every morning. No further refills without appointment., Disp: 30 capsule, Rfl: 0 .  Calcium-Magnesium-Zinc 500-250-12.5 MG TABS, Take 1 tablet by mouth daily., Disp: , Rfl:  .  Probiotic Product (PROBIOTIC-10 ULTIMATE PO), Take 1 capsule by mouth daily., Disp: , Rfl:  .  tretinoin (RETIN-A) 0.01 % gel, Apply topically at bedtime., Disp: , Rfl:  .  vitamin C (ASCORBIC ACID) 500 MG tablet, Take 1 tablet by mouth daily., Disp: , Rfl:  .  spironolactone (ALDACTONE) 25 MG tablet, Take 50 mg by mouth daily., Disp: , Rfl:   Allergies  Allergen Reactions  . Aspirin Other (See Comments)    "Thins bloods too much"  . Compazine [Prochlorperazine Edisylate]     Dystonic.  Marland Kitchen Other     Compazine    Objective:   There were no vitals taken for this visit.  Patient is well-developed, well-nourished in no acute distress.  Resting comfortably at home.  Head is normocephalic, atraumatic.  No labored breathing.  Speech is clear and coherent with logical content.  Patient is alert and oriented at baseline.   Assessment and Plan:   1. Pinworm infection Daughter with confirmed pinworms.  Recommend the entire family be treated.  For patient Rx albendazole 400 mg once.  Repeat in 14 days.  Home measures and cleaning reviewed with patient.  Follow-up if not resolving.    Leeanne Rio, PA-C 02/26/2020

## 2020-02-26 NOTE — Progress Notes (Signed)
I have discussed the procedure for the virtual visit with the patient who has given consent to proceed with assessment and treatment.    S , CMA     

## 2020-02-26 NOTE — Patient Instructions (Signed)
Take the Albendazole as directed.  Keep hands washed.  Avoid scratching the area.  Wash bed linens 24-48 hours after first dose. Periodically wash while on treatment.  Follow-up in office if symptoms are not resolving.

## 2020-03-24 ENCOUNTER — Other Ambulatory Visit: Payer: Self-pay

## 2020-03-24 DIAGNOSIS — F988 Other specified behavioral and emotional disorders with onset usually occurring in childhood and adolescence: Secondary | ICD-10-CM

## 2020-03-24 MED ORDER — AMPHETAMINE-DEXTROAMPHET ER 20 MG PO CP24: 20.0000 mg | ORAL_CAPSULE | ORAL | 0 refills | Status: DC

## 2020-03-24 NOTE — Telephone Encounter (Signed)
Adderall XR 20mg  tab Filled 02/22/20 #30 no refills LOV 02/26/20 No future visit scheduled [facility] [place]

## 2020-04-22 ENCOUNTER — Other Ambulatory Visit: Payer: Self-pay

## 2020-04-22 DIAGNOSIS — F988 Other specified behavioral and emotional disorders with onset usually occurring in childhood and adolescence: Secondary | ICD-10-CM

## 2020-04-22 MED ORDER — AMPHETAMINE-DEXTROAMPHET ER 20 MG PO CP24: 20.0000 mg | ORAL_CAPSULE | ORAL | 0 refills | Status: DC

## 2020-04-22 NOTE — Telephone Encounter (Signed)
Last filled 03/24/20 with 0 refills Last office visit 02/26/20 No future visit scheduled

## 2020-05-23 ENCOUNTER — Telehealth: Payer: Self-pay | Admitting: Physician Assistant

## 2020-05-23 DIAGNOSIS — F988 Other specified behavioral and emotional disorders with onset usually occurring in childhood and adolescence: Secondary | ICD-10-CM

## 2020-05-23 MED ORDER — AMPHETAMINE-DEXTROAMPHET ER 20 MG PO CP24: 20.0000 mg | ORAL_CAPSULE | ORAL | 0 refills | Status: DC

## 2020-05-23 NOTE — Telephone Encounter (Signed)
Refill sent to pharmacy.   

## 2020-05-23 NOTE — Telephone Encounter (Signed)
Patient is requesting refill on [facility] [place].

## 2020-05-27 ENCOUNTER — Other Ambulatory Visit: Payer: Self-pay | Admitting: Physician Assistant

## 2020-06-24 ENCOUNTER — Encounter: Payer: Self-pay | Admitting: Emergency Medicine

## 2020-06-24 ENCOUNTER — Telehealth: Payer: Self-pay | Admitting: Physician Assistant

## 2020-06-24 NOTE — Telephone Encounter (Signed)
My chart message sent to patient to schedule a video visit for ADHD follow and refill of mediation Adderall

## 2020-06-24 NOTE — Telephone Encounter (Signed)
LOV for ADD was 11/2019. Overdue for 63-month follow-up. Needs to schedule appointment. Thank you.

## 2020-06-24 NOTE — Telephone Encounter (Signed)
Needs refill on adderall - [facility] [place] - Last visit 02/26/2020 - next visit not scheduled

## 2020-06-25 ENCOUNTER — Other Ambulatory Visit: Payer: Self-pay

## 2020-06-25 ENCOUNTER — Telehealth (INDEPENDENT_AMBULATORY_CARE_PROVIDER_SITE_OTHER): Payer: BC Managed Care – PPO | Admitting: Physician Assistant

## 2020-06-25 ENCOUNTER — Encounter: Payer: Self-pay | Admitting: Physician Assistant

## 2020-06-25 DIAGNOSIS — F988 Other specified behavioral and emotional disorders with onset usually occurring in childhood and adolescence: Secondary | ICD-10-CM

## 2020-06-25 DIAGNOSIS — B354 Tinea corporis: Secondary | ICD-10-CM

## 2020-06-25 MED ORDER — CLOTRIMAZOLE-BETAMETHASONE 1-0.05 % EX CREA: 1.0000 "application " | TOPICAL_CREAM | Freq: Two times a day (BID) | CUTANEOUS | 0 refills | Status: DC

## 2020-06-25 MED ORDER — AMPHETAMINE-DEXTROAMPHET ER 30 MG PO CP24: 30.0000 mg | ORAL_CAPSULE | ORAL | 0 refills | Status: DC

## 2020-06-25 NOTE — Progress Notes (Signed)
I have discussed the procedure for the virtual visit with the patient who has given consent to proceed with assessment and treatment.    S , CMA     

## 2020-06-25 NOTE — Progress Notes (Signed)
Virtual Visit via Video   I connected with patient on 06/25/20 at  8:30 AM EDT by a video enabled telemedicine application and verified that I am speaking with the correct person using two identifiers.  Location patient: Home Location provider: [facility], Office Persons participating in the virtual visit: Patient, Provider, [place] ([name])  I discussed the limitations of evaluation and management by telemedicine and the availability of in person appointments. The patient expressed understanding and agreed to proceed.  Subjective:   HPI:   Patient presents via [place] today for follow-up of ADD. Patient is currently on a regimen of Adderall XR 20 mg once daily.  Endorses tolerating medication well without any noted side effects.  Notes this was initially working very well for her, now feeling that the medication is not as effective and wears off quicker.  States this is having impact on the quality of her work.  Notes mood has been stable overall.  Sleep is good.  Patient endorses an itchy, scaly, annular lesion of chest first noted a few days ago after returning from the beach.  Notes the edges of the lesion are raised and red.  Is concerned about having ringworm.  Denies contact with someone having similar symptoms.  ROS:   See pertinent positives and negatives per HPI.  Patient Active Problem List   Diagnosis Date Noted  . 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 Polycystic ovary syndrome 12/24/2019  . Vaginal delivery 02/29/2016  . Anxiety - dx'd at age 65 02/28/2016  . Eczema 02/28/2016  . IBS (irritable bowel syndrome) 02/28/2016  . Scoliosis 02/28/2016  . Seasonal allergies 02/28/2016  . Allergy history, drug -- Compazine (dystonic rxn; seizures) 02/27/2016  . High-risk pregnancy supervision 10/27/2015  . Von Willebrand disease, type I ([place]) 07/11/2015  . 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 Asthma 07/11/2015    Social History    Tobacco Use  . Smoking status: Former Smoker    Quit date: 05/12/2015    Years since quitting: 5.1  . Smokeless tobacco: Never Used  Substance Use Topics  . Alcohol use: Yes    Alcohol/week: 0.0 standard drinks    Comment: 2-3 beers per day    Current Outpatient Medications:  .  albuterol (VENTOLIN HFA) 108 (90 Base) MCG/ACT inhaler, INHALE 1 PUFF INTO THE LUNGS EVERY 6 (SIX) HOURS AS NEEDED FOR WHEEZING OR SHORTNESS OF BREATH., Disp: 6.7 g, Rfl: 5 .  Probiotic Product (PROBIOTIC-10 ULTIMATE PO), Take 1 capsule by mouth daily., Disp: , Rfl:  .  spironolactone (ALDACTONE) 25 MG tablet, Take 50 mg by mouth daily., Disp: , Rfl:  .  tretinoin (RETIN-A) 0.01 % gel, Apply topically at bedtime., Disp: , Rfl:  .  vitamin C (ASCORBIC ACID) 500 MG tablet, Take 1 tablet by mouth daily., Disp: , Rfl:  .  amphetamine-dextroamphetamine (ADDERALL XR) 30 MG 24 hr capsule, Take 1 capsule (30 mg total) by mouth every morning., Disp: 30 capsule, Rfl: 0 .  Calcium-Magnesium-Zinc 500-250-12.5 MG TABS, Take 1 tablet by mouth daily. (Patient not taking: Reported on 06/25/2020), Disp: , Rfl:  .  clotrimazole-betamethasone (LOTRISONE) cream, Apply 1 application topically 2 (two) times daily., Disp: 30 g, Rfl: 0  Allergies  Allergen Reactions  . Aspirin Other (See Comments)    "Thins bloods too much"  . Compazine [Prochlorperazine Edisylate]     Dystonic.  Marland Kitchen Other     Compazine    Objective:   There were no vitals taken for this  visit.  Patient is well-developed, well-nourished in no acute distress.  Resting comfortably at home.  Head is normocephalic, atraumatic.  No labored breathing.  Speech is clear and coherent with logical content.  Patient is alert and oriented at baseline.  Unable to view rash via video due to being in "private" area.   Assessment and Plan:   1. Attention deficit disorder (ADD) without hyperactivity We will increase dose of Adderall XR to 30 mg once daily.  We will see  how this does for overall control of symptoms.  If noting symptoms are well controlled for most of the day but refractory and the mid afternoon, can consider addition of low-dose short acting Adderall.  Patient to follow-up in 2 weeks via MyChart to let us know how things are going so further adjustments can be made.  2. Tinea corporis Most likely etiology giving descriptor of symptoms.  We will start her on Lotrisone twice daily.  Supportive measures reviewed.  If not improving/resolving she will need to follow-up in person for detailed examination.  Patient voiced understanding and agreement with plan.    Leeanne Rio, PA-C 06/25/2020

## 2020-07-24 ENCOUNTER — Telehealth: Payer: Self-pay | Admitting: Physician Assistant

## 2020-07-24 NOTE — Telephone Encounter (Signed)
Controlled medications cannot be sent out of state.  She will have to wait until she returns [place] for refill of her Adderall.  In regards to other symptoms she would need at least a video visit, or since she did a home test that was positive for vaginal yeast, she should start over-the-counter Monistat as this typically is highly effective for mild vaginal yeast infections.

## 2020-07-24 NOTE — Telephone Encounter (Signed)
Spoke with patient of PCP recommendations. Patient has started using the OTC Monistat for the vaginal itching.  Advised patient we could not send controlled medications (Adderall) to another state. She will have to get refilled once she is back in [place].

## 2020-07-24 NOTE — Telephone Encounter (Signed)
Patient needs adderall refilled - [facility] [address]  [place], [place], [place] - Patient also states that she has a vaginal yeast infection - she would like a diflucan sent in also.  She said she took a test from the drugstore and it confirmed a yeast infection.

## 2020-07-30 ENCOUNTER — Other Ambulatory Visit: Payer: Self-pay

## 2020-07-30 ENCOUNTER — Telehealth: Payer: Self-pay | Admitting: Physician Assistant

## 2020-07-30 NOTE — Telephone Encounter (Signed)
Pt called in asking for a refill on the Adderall 30mg , pt uses [facility]

## 2020-07-30 NOTE — Telephone Encounter (Signed)
Refill request sent to PCP for approval ? ?

## 2020-07-30 NOTE — Telephone Encounter (Signed)
Patient called in for refill on adderall LFD 06/25/20 #30 with no refills LOV 06/25/20 NOV none

## 2020-07-31 MED ORDER — AMPHETAMINE-DEXTROAMPHET ER 30 MG PO CP24: 30.0000 mg | ORAL_CAPSULE | ORAL | 0 refills | Status: DC

## 2020-08-13 ENCOUNTER — Encounter: Payer: Self-pay | Admitting: Physician Assistant

## 2020-08-13 ENCOUNTER — Telehealth (INDEPENDENT_AMBULATORY_CARE_PROVIDER_SITE_OTHER): Payer: BC Managed Care – PPO | Admitting: Physician Assistant

## 2020-08-13 DIAGNOSIS — F419 Anxiety disorder, unspecified: Secondary | ICD-10-CM | POA: Diagnosis not present

## 2020-08-13 MED ORDER — CLONAZEPAM 0.5 MG PO TABS
0.2500 mg | ORAL_TABLET | Freq: Two times a day (BID) | ORAL | 0 refills | Status: DC | PRN
Start: 2020-08-13 — End: 2020-09-11

## 2020-08-13 MED ORDER — VENLAFAXINE HCL ER 37.5 MG PO CP24
37.5000 mg | ORAL_CAPSULE | Freq: Every day | ORAL | 1 refills | Status: DC
Start: 2020-08-13 — End: 2020-09-04

## 2020-08-13 NOTE — Progress Notes (Signed)
Virtual Visit via Video   I connected with patient on 08/13/20 at 11:00 AM EDT by a video enabled telemedicine application and verified that I am speaking with the correct person using two identifiers.  Location patient: Home Location provider: [facility], Office Persons participating in the virtual visit: Patient, Provider, [place] (Kendra Patton)  I discussed the limitations of evaluation and management by telemedicine and the availability of in person appointments. The patient expressed understanding and agreed to proceed.  Subjective:   HPI:   Patient presents via [place] today to discuss ongoing anxiety symptoms which have significantly deteriorated over the past 2 months.  Patient with history of anxiety and depression since childhood.  Notes about 10 years ago she had significant deterioration in her symptoms where she was placed on medication and followed by therapist.  Notes things calm down and she has done well since.  Has noted some increase in anxiety over the past year but over the past 2 months is having significant increased generalized anxiety along with panic attack and insomnia.  That she will have episodes at night where she will wake up with her heart racing and crying.  States she has to try to get away from others when she is feeling anxious as this is the only way she can calm things down.  Notes previously been on medications including Prozac (subtherapeutic), Xanax (could not tolerate) and Klonopin (tolerated well. Used for a short period of time).  Patient denies overt depressed mood but will occasionally notes some anhedonia.  Denies suicidal thought or ideation.  ROS:   See pertinent positives and negatives per HPI.  Patient Active Problem List   Diagnosis Date Noted  . 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 Polycystic ovary syndrome 12/24/2019  . Vaginal delivery 02/29/2016  . Anxiety - dx'd at age 13 02/28/2016  .  Eczema 02/28/2016  . IBS (irritable bowel syndrome) 02/28/2016  . Scoliosis 02/28/2016  . Seasonal allergies 02/28/2016  . Allergy history, drug -- Compazine (dystonic rxn; seizures) 02/27/2016  . High-risk pregnancy supervision 10/27/2015  . Von Willebrand disease, type I ([place]) 07/11/2015  . 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 Asthma 07/11/2015    Social History   Tobacco Use  . Smoking status: Former Smoker    Quit date: 05/12/2015    Years since quitting: 5.2  . Smokeless tobacco: Never Used  Substance Use Topics  . Alcohol use: Yes    Alcohol/week: 0.0 standard drinks    Comment: 2-3 beers per day    Current Outpatient Medications:  .  albuterol (VENTOLIN HFA) 108 (90 Base) MCG/ACT inhaler, INHALE 1 PUFF INTO THE LUNGS EVERY 6 (SIX) HOURS AS NEEDED FOR WHEEZING OR SHORTNESS OF BREATH., Disp: 6.7 g, Rfl: 5 .  amphetamine-dextroamphetamine (ADDERALL XR) 30 MG 24 hr capsule, Take 1 capsule (30 mg total) by mouth every morning., Disp: 30 capsule, Rfl: 0 .  clotrimazole-betamethasone (LOTRISONE) cream, Apply 1 application topically 2 (two) times daily., Disp: 30 g, Rfl: 0 .  Probiotic Product (PROBIOTIC-10 ULTIMATE PO), Take 1 capsule by mouth daily., Disp: , Rfl:  .  spironolactone (ALDACTONE) 25 MG tablet, Take 50 mg by mouth daily., Disp: , Rfl:  .  tretinoin (RETIN-A) 0.01 % gel, Apply topically at bedtime., Disp: , Rfl:  .  vitamin C (ASCORBIC ACID) 500 MG tablet, Take 1 tablet by mouth daily., Disp: , Rfl:   Allergies  Allergen Reactions  . Aspirin Other (See Comments)    "Thins bloods  too much"  . Compazine [Prochlorperazine Edisylate]     Dystonic.  Marland Kitchen Other     Compazine    Objective:   There were no vitals taken for this visit.  Patient is well-developed, well-nourished in no acute distress.  Resting comfortably at home.  Head is normocephalic, atraumatic.  No labored breathing.  Speech is clear and coherent with logical content.  Patient is alert and oriented at  baseline.   Assessment and Plan:   1. Anxiety Patient given information on counseling services.  Written copy of this also sent to her MyChart.  Given severity of symptoms recommend a combination of both counseling and pharmacotherapy.  Will start venlafaxine 37.5 mg daily.  Klonopin 0.25 mg twice daily as needed for more significant/acute anxiety.  We will get her in for labs to include thyroid function to further assess.  Strict return precautions reviewed with patient.  Patient voiced understanding and agreement with plan.    Leeanne Rio, [place] 08/13/2020

## 2020-08-13 NOTE — Progress Notes (Signed)
I have discussed the procedure for the virtual visit with the patient who has given consent to proceed with assessment and treatment.    S , CMA     

## 2020-08-13 NOTE — Patient Instructions (Signed)
Instructions sent to MyChart

## 2020-08-16 ENCOUNTER — Other Ambulatory Visit: Payer: Self-pay | Admitting: Physician Assistant

## 2020-08-20 ENCOUNTER — Telehealth: Payer: Self-pay | Admitting: Physician Assistant

## 2020-08-20 DIAGNOSIS — F419 Anxiety disorder, unspecified: Secondary | ICD-10-CM

## 2020-08-20 NOTE — Telephone Encounter (Signed)
Lab order placed for thyroid. Patient can come in for lab appointment to have drawn.

## 2020-08-20 NOTE — Telephone Encounter (Signed)
Just TSH for now. She is overdue for CPE which she can schedule at her convenience.  We can do other routine labs at that time.

## 2020-08-20 NOTE — Telephone Encounter (Signed)
Please advise of any other labs to order on patient other than TSH.

## 2020-08-20 NOTE — Telephone Encounter (Signed)
Patient would like to know if her labs were ever ordered.  Please advise

## 2020-08-21 ENCOUNTER — Other Ambulatory Visit: Payer: Self-pay

## 2020-08-21 ENCOUNTER — Ambulatory Visit (INDEPENDENT_AMBULATORY_CARE_PROVIDER_SITE_OTHER): Payer: BC Managed Care – PPO

## 2020-08-21 DIAGNOSIS — F419 Anxiety disorder, unspecified: Secondary | ICD-10-CM | POA: Diagnosis not present

## 2020-08-21 LAB — TSH: TSH: 1.91 u[IU]/mL (ref 0.35–4.50)

## 2020-08-22 ENCOUNTER — Ambulatory Visit: Payer: BC Managed Care – PPO

## 2020-08-27 MED ORDER — BUPROPION HCL ER (XL) 150 MG PO TB24
150.0000 mg | ORAL_TABLET | Freq: Every day | ORAL | 1 refills | Status: DC
Start: 2020-08-27 — End: 2020-10-14

## 2020-08-27 NOTE — Addendum Note (Signed)
Addended by: Brunetta Jeans on: 08/27/2020 08:13 AM   Modules accepted: Orders

## 2020-08-28 ENCOUNTER — Encounter: Payer: Self-pay | Admitting: Physician Assistant

## 2020-08-29 ENCOUNTER — Other Ambulatory Visit: Payer: Self-pay | Admitting: Physician Assistant

## 2020-08-29 DIAGNOSIS — F988 Other specified behavioral and emotional disorders with onset usually occurring in childhood and adolescence: Secondary | ICD-10-CM

## 2020-08-29 MED ORDER — AMPHETAMINE-DEXTROAMPHET ER 30 MG PO CP24: 30.0000 mg | ORAL_CAPSULE | ORAL | 0 refills | Status: DC

## 2020-08-29 NOTE — Telephone Encounter (Signed)
Adderall last rx 07/31/2020 #30 LOV: 08/13/20 Anxiety

## 2020-08-29 NOTE — Telephone Encounter (Signed)
Patient would like to get their adderall refilled - Pharmacy:  [facility] [place] - Last visit - 08/13/2020 - No current upcoming visit scheduled. - Patient said that she has been talking to you on Mychart - Her child is being tested for covid today, so she would not be able to come in until next week.

## 2020-09-04 ENCOUNTER — Other Ambulatory Visit: Payer: Self-pay | Admitting: Physician Assistant

## 2020-09-11 ENCOUNTER — Telehealth: Payer: Self-pay | Admitting: Physician Assistant

## 2020-09-11 ENCOUNTER — Other Ambulatory Visit: Payer: Self-pay

## 2020-09-11 MED ORDER — CLONAZEPAM 0.5 MG PO TABS: 0.2500 mg | ORAL_TABLET | Freq: Two times a day (BID) | ORAL | 0 refills | Status: DC | PRN

## 2020-09-11 NOTE — Telephone Encounter (Signed)
Clonazepam LFD 08/13/20 #30 with no refills LOV 08/13/20 NOV none

## 2020-09-11 NOTE — Telephone Encounter (Signed)
Refill request sent to PCP for approval ? ?

## 2020-09-11 NOTE — Telephone Encounter (Signed)
..   LAST APPOINTMENT DATE: 09/04/2020   NEXT APPOINTMENT DATE:@Visit  date not found  MEDICATION:clonazePAM (KLONOPIN) 0.5 MG tablet  [id number] [place] [place] - [address]  **Let patient know to contact pharmacy at the end of the day to make sure medication is ready. **  ** Please notify patient to allow 48-72 hours to process**  **Encourage patient to contact the pharmacy for refills or they can request refills through [facility]**  CLINICAL FILLS OUT ALL BELOW:   LAST REFILL:  QTY:  REFILL DATE:    OTHER COMMENTS:    Okay for refill?  Please advise

## 2020-09-18 ENCOUNTER — Other Ambulatory Visit: Payer: Self-pay | Admitting: Physician Assistant

## 2020-09-23 ENCOUNTER — Encounter: Payer: Self-pay | Admitting: Physician Assistant

## 2020-09-23 ENCOUNTER — Ambulatory Visit (INDEPENDENT_AMBULATORY_CARE_PROVIDER_SITE_OTHER): Payer: BC Managed Care – PPO | Admitting: Physician Assistant

## 2020-09-23 ENCOUNTER — Other Ambulatory Visit (HOSPITAL_COMMUNITY)
Admission: RE | Admit: 2020-09-23 | Discharge: 2020-09-23 | Disposition: A | Discharge status: Home / Self Care | Payer: BC Managed Care – PPO | Source: Ambulatory Visit | Attending: Physician Assistant | Admitting: Physician Assistant

## 2020-09-23 ENCOUNTER — Other Ambulatory Visit: Payer: BC Managed Care – PPO

## 2020-09-23 ENCOUNTER — Other Ambulatory Visit: Payer: Self-pay

## 2020-09-23 VITALS — BP 118/75 | HR 97 | Temp 98.3°F | Resp 19 | Wt 173.2 lb

## 2020-09-23 DIAGNOSIS — Z202 Contact with and (suspected) exposure to infections with a predominantly sexual mode of transmission: Secondary | ICD-10-CM | POA: Insufficient documentation

## 2020-09-23 DIAGNOSIS — B354 Tinea corporis: Secondary | ICD-10-CM | POA: Diagnosis not present

## 2020-09-23 DIAGNOSIS — N760 Acute vaginitis: Secondary | ICD-10-CM | POA: Insufficient documentation

## 2020-09-23 DIAGNOSIS — B9689 Other specified bacterial agents as the cause of diseases classified elsewhere: Secondary | ICD-10-CM | POA: Insufficient documentation

## 2020-09-23 LAB — POCT URINALYSIS DIPSTICK OB
Bilirubin, UA: NEGATIVE
Blood, UA: NEGATIVE
Glucose, UA: NEGATIVE
Ketones, UA: NEGATIVE
Leukocytes, UA: NEGATIVE
Nitrite, UA: NEGATIVE
POC,PROTEIN,UA: NEGATIVE
Spec Grav, UA: 1.01 (ref 1.010–1.025)
Urobilinogen, UA: 0.2 E.U./dL
pH, UA: 6 (ref 5.0–8.0)

## 2020-09-23 MED ORDER — TERBINAFINE HCL 250 MG PO TABS: 250.0000 mg | ORAL_TABLET | Freq: Every day | ORAL | 0 refills | Status: DC

## 2020-09-23 NOTE — Patient Instructions (Addendum)
Please go to the [place] office at [place]. [place], [place] for labs. They are walk-in 8-5, M-F.  Your vaginal swab has been sent to [facility].   Avoid any sexual activity until we get results in.  Notify me of any new or worsening symptoms.  Keep hydrated and get plenty of rest.   Take the Terbinafine once daily as directed for 10-14 days. If no improvement in 1 week, let me know as I would want to assess for other causes of rash.

## 2020-09-23 NOTE — Progress Notes (Signed)
Patient presents to clinic today to discuss concern of STI exposure. Is sexually active with one new female partner who was exposed to gonorrhea/chlamydia and syphilis from a previous female partner. Patient notes he was tested and negative but she wants to be tested herself to make certain things are clear. Denies fever, chills. Denies vaginal discharge but has noted vaginal odor. Also noting intermittent sore throat over the past few weeks. Denies nasal congestion, PND or heartburn symptoms.   Patient also endorses itchy rash of sternal region of chest extending now to her R medial breast. Has applied topical triamcinolone and OTC antifungal with only mild improvement in symptoms. Denies similar rash noted elsewhere.   Past Medical History:  Diagnosis Date  . Anxiety    age 76  . Asthma   . Depression   . Eczema   . High-risk pregnancy supervision 10/27/2015  . 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 History of OCD (obsessive compulsive disorder)   . IBS (irritable bowel syndrome)   . Scoliosis   . Von Willebrand disease ([place])     Current Outpatient Medications on File Prior to Visit  Medication Sig Dispense Refill  . albuterol (VENTOLIN HFA) 108 (90 Base) MCG/ACT inhaler INHALE 1 PUFF INTO THE LUNGS EVERY 6 (SIX) HOURS AS NEEDED FOR WHEEZING OR SHORTNESS OF BREATH. 6.7 g 5  . amphetamine-dextroamphetamine (ADDERALL XR) 30 MG 24 hr capsule Take 1 capsule (30 mg total) by mouth every morning. 30 capsule 0  . buPROPion (WELLBUTRIN XL) 150 MG 24 hr tablet Take 1 tablet (150 mg total) by mouth daily. 30 tablet 1  . clonazePAM (KLONOPIN) 0.5 MG tablet Take 0.5 tablets (0.25 mg total) by mouth 2 (two) times daily as needed for anxiety. 30 tablet 0  . Probiotic Product (PROBIOTIC-10 ULTIMATE PO) Take 1 capsule by mouth daily.    Marland Kitchen spironolactone (ALDACTONE) 25 MG tablet Take 50 mg by mouth daily.    Marland Kitchen tretinoin (RETIN-A) 0.01 % gel Apply topically at bedtime.    Marland Kitchen venlafaxine XR (EFFEXOR-XR) 37.5  MG 24 hr capsule TAKE 1 CAPSULE BY MOUTH DAILY WITH BREAKFAST. 90 capsule 1  . vitamin C (ASCORBIC ACID) 500 MG tablet Take 1 tablet by mouth daily.    . clotrimazole-betamethasone (LOTRISONE) cream Apply 1 application topically 2 (two) times daily. (Patient not taking: Reported on 09/23/2020) 30 g 0   No current facility-administered medications on file prior to visit.    Allergies  Allergen Reactions  . Aspirin Other (See Comments)    "Thins bloods too much"  . Compazine [Prochlorperazine Edisylate]     Dystonic.  Marland Kitchen Other     Compazine    Family History  Problem Relation Age of Onset  . Asthma Mother   . Hypertension Mother   . Cancer Mother        Type of Skin Cancer  . Diabetes Mother   . Depression Mother   . Heart disease Mother   . Varicose Veins Mother   . Multiple sclerosis Mother   . Cancer Father        Skin  . Deep vein thrombosis Father   . Asthma Sister   . Hypertension Sister   . Depression Sister   . Learning disabilities Sister   . Varicose Veins Sister   . Cancer Maternal Grandmother   . Cancer Paternal Grandfather        lung  . Asthma Sister   . Depression Sister   .  Deafness Daughter     Social History   Socioeconomic History  . Marital status: Married    Spouse name: Not on file  . Number of children: Not on file  . Years of education: Not on file  . Highest education level: Not on file  Occupational History  . Not on file  Tobacco Use  . Smoking status: Former Smoker    Quit date: 05/12/2015    Years since quitting: 5.3  . Smokeless tobacco: Never Used  Vaping Use  . Vaping Use: Never used  Substance and Sexual Activity  . Alcohol use: Yes    Alcohol/week: 0.0 standard drinks    Comment: 2-3 beers per day  . Drug use: No  . Sexual activity: Yes    Birth control/protection: None  Other Topics Concern  . Not on file  Social History Narrative  . Not on file   Social Determinants of Health   Financial Resource Strain:   .  Difficulty of Paying Living Expenses: Not on file  Food Insecurity:   . Worried About Charity fundraiser in the Last Year: Not on file  . Ran Out of Food in the Last Year: Not on file  Transportation Needs:   . Lack of Transportation (Medical): Not on file  . Lack of Transportation (Non-Medical): Not on file  Physical Activity:   . Days of Exercise per Week: Not on file  . Minutes of Exercise per Session: Not on file  Stress:   . Feeling of Stress : Not on file  Social Connections:   . Frequency of Communication with Friends and Family: Not on file  . Frequency of Social Gatherings with Friends and Family: Not on file  . Attends Religious Services: Not on file  . Active Member of Clubs or Organizations: Not on file  . Attends Archivist Meetings: Not on file  . Marital Status: Not on file   Review of Systems - See HPI.  All other ROS are negative.  BP 118/75   Pulse 97   Temp 98.3 F (36.8 C) (Temporal)   Resp 19   Wt 173 lb 3.2 oz (78.6 kg)   SpO2 98%   BMI 28.38 kg/m   Physical Exam Vitals reviewed. Exam conducted with a chaperone present.  Constitutional:      Appearance: Normal appearance.  HENT:     Head: Normocephalic and atraumatic.  Cardiovascular:     Rate and Rhythm: Normal rate and regular rhythm.     Pulses: Normal pulses.     Heart sounds: Normal heart sounds.  Pulmonary:     Effort: Pulmonary effort is normal.     Breath sounds: Normal breath sounds.  Chest:    Genitourinary:    General: Normal vulva.     Labia:        Right: No rash or tenderness.        Left: No rash or tenderness.      Vagina: No vaginal discharge.  Musculoskeletal:     Cervical back: Neck supple.  Neurological:     General: No focal deficit present.     Mental Status: She is alert and oriented to person, place, and time.  Psychiatric:        Mood and Affect: Mood normal.    Recent Results (from the past 2160 hour(s))  TSH     Status: None   Collection Time:  08/21/20 10:49 AM  Result Value Ref Range   TSH 1.91  0.35 - 4.50 uIU/mL    Assessment/Plan: 1. STD exposure Mild vaginal odor per patient report.  No discharge.  Will obtain full lab panel today to include HIV antibody, RPR for syphilis screen, HSV panel and acute hepatitis panel.  Vaginal swab obtained to be sent for GC, chlamydia, yeast, BV and trichomoniasis.  UA today in office unremarkable.  Will treat based on findings.  Safe sex practices discussed with patient.  She is to avoid any sexual activity until results are in. - Acute Hep Panel & Hep B Surface Ab; Future - HIV Antibody (routine testing w rflx); Future - RPR; Future - HSV(herpes smplx)abs-1+2(IgG+IgM)-bld; Future - Cervicovaginal ancillary only( [place]) - POC Urinalysis Dipstick OB  2. Tinea corporis Moderate disease.  Supportive measures and OTC medications reviewed.  Rx terbinafine 250 mg once daily x2 weeks to help clear since it was resistant to OTC topical antifungal/steroid.  If not improving will need further assessment.  She is young and this does not seem typical for [place] but would want to rule this out if symptoms not resolving with treatment for tinea corporis.  This visit occurred during the SARS-CoV-2 public health emergency.  Safety protocols were in place, including screening questions prior to the visit, additional usage of staff PPE, and extensive cleaning of exam room while observing appropriate contact time as indicated for disinfecting solutions.     Leeanne Rio, PA-C

## 2020-09-24 ENCOUNTER — Other Ambulatory Visit: Payer: Self-pay | Admitting: Emergency Medicine

## 2020-09-24 DIAGNOSIS — N760 Acute vaginitis: Secondary | ICD-10-CM

## 2020-09-24 LAB — ACUTE HEP PANEL AND HEP B SURFACE AB
HEPATITIS C ANTIBODY REFILL$(REFL): NONREACTIVE
Hep A IgM: NONREACTIVE
Hep B C IgM: NONREACTIVE
Hepatitis B Surface Ag: NONREACTIVE
SIGNAL TO CUT-OFF: 0.59 (ref <1.00)

## 2020-09-24 LAB — REFLEX TIQ

## 2020-09-24 LAB — HIV ANTIBODY (ROUTINE TESTING W REFLEX): HIV 1&2 Ab, 4th Generation: NONREACTIVE

## 2020-09-24 LAB — CERVICOVAGINAL ANCILLARY ONLY
Bacterial Vaginitis (gardnerella): POSITIVE — AB
Candida Glabrata: NEGATIVE
Candida Vaginitis: NEGATIVE
Chlamydia: NEGATIVE
Comment: NEGATIVE
Comment: NEGATIVE
Comment: NEGATIVE
Comment: NEGATIVE
Comment: NEGATIVE
Comment: NORMAL
Neisseria Gonorrhea: NEGATIVE
Trichomonas: NEGATIVE

## 2020-09-24 LAB — RPR: RPR Ser Ql: NONREACTIVE

## 2020-09-24 MED ORDER — METRONIDAZOLE 500 MG PO TABS: 500.0000 mg | ORAL_TABLET | Freq: Two times a day (BID) | ORAL | 0 refills | Status: AC

## 2020-09-25 ENCOUNTER — Telehealth: Payer: Self-pay | Admitting: Emergency Medicine

## 2020-09-25 ENCOUNTER — Telehealth: Payer: Self-pay | Admitting: Physician Assistant

## 2020-09-25 LAB — HSV(HERPES SMPLX)ABS-I+II(IGG+IGM)-BLD
HSV 1 Glycoprotein G Ab, IgG: 29.3 index — ABNORMAL HIGH (ref 0.00–0.90)
HSV 2 IgG, Type Spec: <0.91 index (ref 0.00–0.90)
HSVI/II Comb IgM: <0.91 Ratio (ref 0.00–0.90)

## 2020-09-25 NOTE — Telephone Encounter (Signed)
Additional questionnaire faxed back to insurance for approval

## 2020-09-25 NOTE — Telephone Encounter (Signed)
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

## 2020-09-25 NOTE — Telephone Encounter (Signed)
PA started forTerbinafine HCL 250 mg Cover My Meds Key: [id number] Waiting on response from insurance company for coverage of medication

## 2020-09-25 NOTE — Telephone Encounter (Signed)
Returned patients call about labs. No vm to leave message.

## 2020-09-26 ENCOUNTER — Other Ambulatory Visit: Payer: Self-pay | Admitting: Physician Assistant

## 2020-09-26 DIAGNOSIS — F988 Other specified behavioral and emotional disorders with onset usually occurring in childhood and adolescence: Secondary | ICD-10-CM

## 2020-09-26 MED ORDER — AMPHETAMINE-DEXTROAMPHET ER 30 MG PO CP24: 30.0000 mg | ORAL_CAPSULE | ORAL | 0 refills | Status: DC

## 2020-09-26 NOTE — Telephone Encounter (Signed)
..  Medication Refills  Medication:  Adderall  [facility] [place]   ** Let patient know to contact pharmacy at the end of the day to make sure medication is ready.**  ** Please notify patient to allow 48-72 hours to process.**  ** Encourage patient to contact the pharmacy for refills or they can request refills through [facility]**  Clinical Fills out below:   Last refill:  QTY:  Refill Date:    Other Comments:  Patient would also like to know about test results from yesterday.   Okay for refill?  Please advise.

## 2020-09-26 NOTE — Telephone Encounter (Signed)
Received PA from insurance co, medication was denied.

## 2020-09-26 NOTE — Telephone Encounter (Signed)
Last rx for Adderall was 08/29/20 #30 LOV: 09/23/2020 STD exposure ADD last appt 06/25/20

## 2020-10-06 ENCOUNTER — Telehealth: Payer: Self-pay | Admitting: Physician Assistant

## 2020-10-06 MED ORDER — FLUCONAZOLE 150 MG PO TABS: 150.0000 mg | ORAL_TABLET | Freq: Once | ORAL | 0 refills | Status: AC

## 2020-10-06 NOTE — Telephone Encounter (Signed)
Patient would like a diflucan called in - she has developed a yeast infection from the flagyl - [facility] [place] - please advise

## 2020-10-06 NOTE — Telephone Encounter (Signed)
Spoke with pt. She said that the chest rash has gotten a little better, but has not resolved. However, she has an appt with her OB tomorrow since she has had a lump on that same breast for 1.5 years, and with pain during her period they wanted to evaluate it more.

## 2020-10-06 NOTE — Telephone Encounter (Signed)
Rx sent to pharmacy.  How is her chest rash doing?

## 2020-10-10 ENCOUNTER — Other Ambulatory Visit: Payer: Self-pay | Admitting: Physician Assistant

## 2020-10-10 ENCOUNTER — Telehealth: Payer: Self-pay | Admitting: Physician Assistant

## 2020-10-10 NOTE — Telephone Encounter (Signed)
Duplicate note for refill of Clonazepam.

## 2020-10-10 NOTE — Telephone Encounter (Signed)
Clonazepam last rx 09/11/20 #30 LOV: 09/23/20 STD

## 2020-10-10 NOTE — Telephone Encounter (Signed)
Medication Refills  Medication:  Clonazepan    [facility] [place]  ** Let patient know to contact pharmacy at the end of the day to make sure medication is ready.**  ** Please notify patient to allow 48-72 hours to process.**  ** Encourage patient to contact the pharmacy for refills or they can request refills through [facility]**  Clinical Fills out below:   Last refill:  QTY:  Refill Date:    Other Comments:   Okay for refill?  Please advise.

## 2020-10-14 ENCOUNTER — Other Ambulatory Visit: Payer: Self-pay | Admitting: Physician Assistant

## 2020-10-15 ENCOUNTER — Telehealth (INDEPENDENT_AMBULATORY_CARE_PROVIDER_SITE_OTHER): Payer: BC Managed Care – PPO | Admitting: Physician Assistant

## 2020-10-15 DIAGNOSIS — B9689 Other specified bacterial agents as the cause of diseases classified elsewhere: Secondary | ICD-10-CM | POA: Diagnosis not present

## 2020-10-15 DIAGNOSIS — J4530 Mild persistent asthma, uncomplicated: Secondary | ICD-10-CM | POA: Diagnosis not present

## 2020-10-15 DIAGNOSIS — J019 Acute sinusitis, unspecified: Secondary | ICD-10-CM

## 2020-10-15 MED ORDER — AMOXICILLIN-POT CLAVULANATE 875-125 MG PO TABS: 1.0000 | ORAL_TABLET | Freq: Two times a day (BID) | ORAL | 0 refills | Status: DC

## 2020-10-15 MED ORDER — BENZONATATE 100 MG PO CAPS: 100.0000 mg | ORAL_CAPSULE | Freq: Three times a day (TID) | ORAL | 0 refills | Status: DC | PRN

## 2020-10-15 NOTE — Progress Notes (Signed)
Virtual Visit via Video   I connected with patient on 10/15/20 at  2:30 PM EDT by a video enabled telemedicine application and verified that I am speaking with the correct person using two identifiers.  Location patient: Home Location provider: [facility], Office Persons participating in the virtual visit: Patient, Provider, [place] ([name])  I discussed the limitations of evaluation and management by telemedicine and the availability of in person appointments. The patient expressed understanding and agreed to proceed.  Subjective:   HPI:   Patient presents via [place] today complaining of 2 weeks of nasal congestion and cough associated with sinus pressure and now sinus pain/tooth pain bilaterally.  Denies fever, chills, chest pain or shortness of breath.  Denies loss of taste or smell.  Had Covid testing was negative.  Has been taking Mucinex OTC.  No other medications at present for symptoms.  ROS:   See pertinent positives and negatives per HPI.  Patient Active Problem List   Diagnosis Date Noted  . 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 Polycystic ovary syndrome 12/24/2019  . Vaginal delivery 02/29/2016  . Anxiety - dx'd at age 72 02/28/2016  . Eczema 02/28/2016  . IBS (irritable bowel syndrome) 02/28/2016  . Scoliosis 02/28/2016  . Seasonal allergies 02/28/2016  . Allergy history, drug -- Compazine (dystonic rxn; seizures) 02/27/2016  . High-risk pregnancy supervision 10/27/2015  . Von Willebrand disease, type I ([place]) 07/11/2015  . 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 Asthma 07/11/2015    Social History   Tobacco Use  . Smoking status: Former Smoker    Quit date: 05/12/2015    Years since quitting: 5.4  . Smokeless tobacco: Never Used  Substance Use Topics  . Alcohol use: Yes    Alcohol/week: 0.0 standard drinks    Comment: 2-3 beers per day    Current Outpatient Medications:  .  albuterol (VENTOLIN HFA) 108 (90 Base)  MCG/ACT inhaler, INHALE 1 PUFF INTO THE LUNGS EVERY 6 (SIX) HOURS AS NEEDED FOR WHEEZING OR SHORTNESS OF BREATH., Disp: 6.7 g, Rfl: 5 .  amoxicillin-clavulanate (AUGMENTIN) 875-125 MG tablet, Take 1 tablet by mouth 2 (two) times daily., Disp: 14 tablet, Rfl: 0 .  amphetamine-dextroamphetamine (ADDERALL XR) 30 MG 24 hr capsule, Take 1 capsule (30 mg total) by mouth every morning., Disp: 30 capsule, Rfl: 0 .  benzonatate (TESSALON) 100 MG capsule, Take 1 capsule (100 mg total) by mouth 3 (three) times daily as needed., Disp: 30 capsule, Rfl: 0 .  buPROPion (WELLBUTRIN XL) 150 MG 24 hr tablet, TAKE 1 TABLET BY MOUTH EVERY DAY, Disp: 90 tablet, Rfl: 1 .  clonazePAM (KLONOPIN) 0.5 MG tablet, TAKE 0.5 TABLETS (0.25 MG TOTAL) BY MOUTH 2 (TWO) TIMES DAILY AS NEEDED FOR ANXIETY., Disp: 30 tablet, Rfl: 0 .  clotrimazole-betamethasone (LOTRISONE) cream, Apply 1 application topically 2 (two) times daily. (Patient not taking: Reported on 09/23/2020), Disp: 30 g, Rfl: 0 .  Probiotic Product (PROBIOTIC-10 ULTIMATE PO), Take 1 capsule by mouth daily., Disp: , Rfl:  .  spironolactone (ALDACTONE) 25 MG tablet, Take 50 mg by mouth daily., Disp: , Rfl:  .  tretinoin (RETIN-A) 0.01 % gel, Apply topically at bedtime., Disp: , Rfl:  .  venlafaxine XR (EFFEXOR-XR) 37.5 MG 24 hr capsule, TAKE 1 CAPSULE BY MOUTH DAILY WITH BREAKFAST., Disp: 90 capsule, Rfl: 1 .  vitamin C (ASCORBIC ACID) 500 MG tablet, Take 1 tablet by mouth daily., Disp: , Rfl:   Allergies  Allergen Reactions  . Aspirin  Other (See Comments)    "Thins bloods too much"  . Compazine [Prochlorperazine Edisylate]     Dystonic.  Marland Kitchen Other     Compazine    Objective:   There were no vitals taken for this visit.  Patient is well-developed, well-nourished in no acute distress.  Resting comfortably at home.  Head is normocephalic, atraumatic.  No labored breathing.  Speech is clear and coherent with logical content.  Patient is alert and oriented at  baseline.  + TTP maxillary sinuses  Assessment and Plan:   1. Acute bacterial sinusitis Rx Augmentin.  Increase fluids.  Rest.  Saline nasal spray.  Probiotic.  Mucinex as directed.  Humidifier in bedroom. Flonase as directed.  Call or return to clinic if symptoms are not improving.  2. Mild persistent asthma, unspecified whether complicated No current exacerbation.  Patient continue albuterol inhaler as directed.  Request updated nebulizer machine to have on hand for exacerbations.  DME order placed. - For home use only DME Nebulizer machine    Leeanne Rio, [place] 10/15/2020

## 2020-10-29 ENCOUNTER — Other Ambulatory Visit: Payer: Self-pay | Admitting: Physician Assistant

## 2020-10-29 DIAGNOSIS — F988 Other specified behavioral and emotional disorders with onset usually occurring in childhood and adolescence: Secondary | ICD-10-CM

## 2020-10-29 MED ORDER — AMPHETAMINE-DEXTROAMPHET ER 30 MG PO CP24: 30.0000 mg | ORAL_CAPSULE | ORAL | 0 refills | Status: DC

## 2020-10-29 NOTE — Telephone Encounter (Signed)
Adderall last rx 09/26/2020 #30 LOV: 06/25/20 ADD No CSC

## 2020-10-29 NOTE — Telephone Encounter (Signed)
Pt called in asking for a new script of the Adderall XR [facility] [place], last fill date was 09/26/20   Please advise

## 2020-11-11 ENCOUNTER — Other Ambulatory Visit: Payer: Self-pay | Admitting: Physician Assistant

## 2020-11-11 MED ORDER — CLONAZEPAM 0.5 MG PO TABS: 0.2500 mg | ORAL_TABLET | Freq: Two times a day (BID) | ORAL | 0 refills | Status: DC | PRN

## 2020-11-11 NOTE — Telephone Encounter (Signed)
Medication Refills  Medication: Klonopin  [facility] [place] [place] ** Let patient know to contact pharmacy at the end of the day to make sure medication is ready.**  ** Please notify patient to allow 48-72 hours to process.**  ** Encourage patient to contact the pharmacy for refills or they can request refills through [facility]**  Clinical Fills out below:   Last refill:  QTY:  Refill Date:    Other Comments:   Okay for refill?  Please advise.

## 2020-11-11 NOTE — Telephone Encounter (Signed)
LFD 10/10/20 #30 with no refills LOV 10/15/20 NOV none

## 2020-11-26 ENCOUNTER — Other Ambulatory Visit: Payer: Self-pay | Admitting: Physician Assistant

## 2020-11-26 DIAGNOSIS — F988 Other specified behavioral and emotional disorders with onset usually occurring in childhood and adolescence: Secondary | ICD-10-CM

## 2020-11-26 MED ORDER — AMPHETAMINE-DEXTROAMPHET ER 30 MG PO CP24: 30.0000 mg | ORAL_CAPSULE | ORAL | 0 refills | Status: DC

## 2020-11-26 NOTE — Telephone Encounter (Signed)
..  Medication Refills  Medication:  Adderall xr - 30 mg.  [facility] [place]  ** Let patient know to contact pharmacy at the end of the day to make sure medication is ready.**  ** Please notify patient to allow 48-72 hours to process.**  ** Encourage patient to contact the pharmacy for refills or they can request refills through [facility]**  Clinical Fills out below:   Last refill:  QTY:  Refill Date:    Other Comments: Patient says that she might be going out of town tomorrow and she would appreciate it being called in today Okay for refill?  Please advise.

## 2020-11-26 NOTE — Telephone Encounter (Signed)
Adderall last rx 10/29/20 #30 LOV: 06/25/20 ADD

## 2020-12-05 ENCOUNTER — Telehealth: Payer: Self-pay | Admitting: Physician Assistant

## 2020-12-05 DIAGNOSIS — F419 Anxiety disorder, unspecified: Secondary | ICD-10-CM

## 2020-12-05 MED ORDER — VENLAFAXINE HCL ER 37.5 MG PO CP24: ORAL_CAPSULE | ORAL | 1 refills | Status: DC

## 2020-12-05 NOTE — Telephone Encounter (Signed)
Spoke with patient and she needed a refill of the Velafaxine.  The bottle ended up open and some of the capsules were broken. She doesn't know how many were broken Advised we can refill the Effexor to the pharmacy.

## 2020-12-05 NOTE — Telephone Encounter (Signed)
Patient states that her depression medication was broken and she will need another rx.  Please contact patient - she is sending you a my chart message concerning this.  She is not sure how many she has lost.  She will try to explain in her e chart message.

## 2020-12-10 ENCOUNTER — Other Ambulatory Visit: Payer: Self-pay | Admitting: Physician Assistant

## 2020-12-10 MED ORDER — CLONAZEPAM 0.5 MG PO TABS: 0.2500 mg | ORAL_TABLET | Freq: Two times a day (BID) | ORAL | 0 refills | Status: DC | PRN

## 2020-12-10 NOTE — Telephone Encounter (Signed)
Clonazepam last rx 11/11/20 #30 LOV: 10/15/20 Acute sinusitis, 08/13/20 Anxiety

## 2020-12-10 NOTE — Telephone Encounter (Signed)
Pt called in asking for a refill on the Clonazepam, [facility] [place].

## 2020-12-18 ENCOUNTER — Encounter: Payer: Self-pay | Admitting: Physician Assistant

## 2020-12-18 ENCOUNTER — Telehealth (INDEPENDENT_AMBULATORY_CARE_PROVIDER_SITE_OTHER): Payer: BC Managed Care – PPO | Admitting: Physician Assistant

## 2020-12-18 ENCOUNTER — Other Ambulatory Visit: Payer: Self-pay

## 2020-12-18 DIAGNOSIS — D680 Von Willebrand's disease: Secondary | ICD-10-CM | POA: Diagnosis not present

## 2020-12-18 DIAGNOSIS — R5382 Chronic fatigue, unspecified: Secondary | ICD-10-CM

## 2020-12-18 DIAGNOSIS — D6801 Von willebrand disease, type 1: Secondary | ICD-10-CM

## 2020-12-18 DIAGNOSIS — F419 Anxiety disorder, unspecified: Secondary | ICD-10-CM

## 2020-12-18 MED ORDER — BUPROPION HCL ER (XL) 300 MG PO TB24: 300.0000 mg | ORAL_TABLET | Freq: Every day | ORAL | 1 refills | Status: DC

## 2020-12-18 NOTE — Progress Notes (Signed)
Virtual Visit via Video   I connected with patient on 12/18/20 at 11:30 AM EST by a video enabled telemedicine application and verified that I am speaking with the correct person using two identifiers.  Location patient: Home Location provider: [facility], Office Persons participating in the virtual visit: Patient, Provider, [place] ([name])  I discussed the limitations of evaluation and management by telemedicine and the availability of in person appointments. The patient expressed understanding and agreed to proceed.  Subjective:   HPI:   Patient presents via [place] today with to discuss potential side effect of her Effexor.  Patient currently on this medication for anxiety depression.  Was started back in August of this year in addition to her Wellbutrin 150 mg daily.  Notes initially doing well with spontaneous anxiety was concerned but notes feeling quite a bit tighter.  He has noted some easy bruising.  Does have history of von Willebrand factor but has not had ongoing issue.  Is wondering if medication is affecting her platelet count.  Denies any shortness of breath at rest or on exertion.  Denies racing heart, lightheadedness or dizziness.  Notes she just always feels tired.   ROS:   See pertinent positives and negatives per HPI.  Patient Active Problem List   Diagnosis Date Noted  . 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 Polycystic ovary syndrome 12/24/2019  . Vaginal delivery 02/29/2016  . Anxiety - dx'd at age 34 02/28/2016  . Eczema 02/28/2016  . IBS (irritable bowel syndrome) 02/28/2016  . Scoliosis 02/28/2016  . Seasonal allergies 02/28/2016  . Allergy history, drug -- Compazine (dystonic rxn; seizures) 02/27/2016  . High-risk pregnancy supervision 10/27/2015  . Von Willebrand disease, type I ([place]) 07/11/2015  . 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 Asthma 07/11/2015    Social History   Tobacco Use  . Smoking status: Former  Smoker    Quit date: 05/12/2015    Years since quitting: 5.6  . Smokeless tobacco: Never Used  Substance Use Topics  . Alcohol use: Yes    Alcohol/week: 0.0 standard drinks    Comment: 2-3 beers per day    Current Outpatient Medications:  .  albuterol (VENTOLIN HFA) 108 (90 Base) MCG/ACT inhaler, INHALE 1 PUFF INTO THE LUNGS EVERY 6 (SIX) HOURS AS NEEDED FOR WHEEZING OR SHORTNESS OF BREATH., Disp: 6.7 g, Rfl: 5 .  amphetamine-dextroamphetamine (ADDERALL XR) 30 MG 24 hr capsule, Take 1 capsule (30 mg total) by mouth every morning., Disp: 30 capsule, Rfl: 0 .  buPROPion (WELLBUTRIN XL) 150 MG 24 hr tablet, TAKE 1 TABLET BY MOUTH EVERY DAY, Disp: 90 tablet, Rfl: 1 .  clonazePAM (KLONOPIN) 0.5 MG tablet, Take 0.5 tablets (0.25 mg total) by mouth 2 (two) times daily as needed for anxiety., Disp: 30 tablet, Rfl: 0 .  spironolactone (ALDACTONE) 25 MG tablet, Take 50 mg by mouth daily., Disp: , Rfl:  .  venlafaxine XR (EFFEXOR-XR) 37.5 MG 24 hr capsule, TAKE 1 CAPSULE BY MOUTH DAILY WITH BREAKFAST., Disp: 90 capsule, Rfl: 1 .  vitamin C (ASCORBIC ACID) 500 MG tablet, Take 1 tablet by mouth daily., Disp: , Rfl:  .  benzonatate (TESSALON) 100 MG capsule, Take 1 capsule (100 mg total) by mouth 3 (three) times daily as needed. (Patient not taking: Reported on 12/18/2020), Disp: 30 capsule, Rfl: 0 .  clotrimazole-betamethasone (LOTRISONE) cream, Apply 1 application topically 2 (two) times daily. (Patient not taking: No sig reported), Disp: 30 g, Rfl: 0 .  Probiotic  Product (PROBIOTIC-10 ULTIMATE PO), Take 1 capsule by mouth daily. (Patient not taking: Reported on 12/18/2020), Disp: , Rfl:  .  tretinoin (RETIN-A) 0.01 % gel, Apply topically at bedtime. (Patient not taking: Reported on 12/18/2020), Disp: , Rfl:   Allergies  Allergen Reactions  . Aspirin Other (See Comments)    "Thins bloods too much"  . Compazine [Prochlorperazine Edisylate]     Dystonic.  Marland Kitchen Other     Compazine    Objective:    There were no vitals taken for this visit.  Patient is well-developed, well-nourished in no acute distress.  Resting comfortably at home.  Head is normocephalic, atraumatic.  No labored breathing.  Speech is clear and coherent with logical content.  Patient is alert and oriented at baseline.   Assessment and Plan:   1. Anxiety - dx'd at age 34 2. Von Willebrand disease, type I ([facility]) 3. Chronic fatigue  Patient with history of anxiety, noting increased fatigue with her Effexor.  Also has some symptoms concerning for anemia.  Unsure if this is truly related to medication or not by giving risks and concerns will wean off of her Effexor over the next week by having her take 1 capsule every other day for 7 days before stopping.  The same time we will increase Wellbutrin to 300 mg daily to further help with mood and anxiety.  Plan to get her in ASAP for labs to include CBC with differential, iron panel, B12 and folate, vitamin D and TSH levels.  Strict ER precautions discussed patient voiced understanding and agreement with plan.  Lab appointment scheduled.    Piedad Climes, PA-C 12/18/2020

## 2020-12-22 ENCOUNTER — Encounter (HOSPITAL_COMMUNITY): Payer: Self-pay | Admitting: Emergency Medicine

## 2020-12-22 ENCOUNTER — Other Ambulatory Visit: Payer: BC Managed Care – PPO

## 2020-12-22 ENCOUNTER — Other Ambulatory Visit: Payer: Self-pay

## 2020-12-22 ENCOUNTER — Emergency Department (HOSPITAL_COMMUNITY)
Admission: EM | Admit: 2020-12-22 | Discharge: 2020-12-22 | Disposition: A | Discharge status: Home / Self Care | Payer: BC Managed Care – PPO | Attending: Emergency Medicine | Admitting: Emergency Medicine

## 2020-12-22 DIAGNOSIS — R109 Unspecified abdominal pain: Secondary | ICD-10-CM | POA: Diagnosis present

## 2020-12-22 DIAGNOSIS — Z5321 Procedure and treatment not carried out due to patient leaving prior to being seen by health care provider: Secondary | ICD-10-CM | POA: Diagnosis not present

## 2020-12-22 LAB — CBC
HCT: 40.2 % (ref 36.0–46.0)
Hemoglobin: 12.3 g/dL (ref 12.0–15.0)
MCH: 24 pg — ABNORMAL LOW (ref 26.0–34.0)
MCHC: 30.6 g/dL (ref 30.0–36.0)
MCV: 78.5 fL — ABNORMAL LOW (ref 80.0–100.0)
Platelets: 437 10*3/uL — ABNORMAL HIGH (ref 150–400)
RBC: 5.12 MIL/uL — ABNORMAL HIGH (ref 3.87–5.11)
RDW: 17.7 % — ABNORMAL HIGH (ref 11.5–15.5)
WBC: 8.5 10*3/uL (ref 4.0–10.5)
nRBC: 0 % (ref 0.0–0.2)

## 2020-12-22 LAB — URINALYSIS, ROUTINE W REFLEX MICROSCOPIC
Bilirubin Urine: NEGATIVE
Glucose, UA: NEGATIVE mg/dL
Ketones, ur: NEGATIVE mg/dL
Leukocytes,Ua: NEGATIVE
Nitrite: NEGATIVE
Protein, ur: NEGATIVE mg/dL
Specific Gravity, Urine: >1.03 — ABNORMAL HIGH (ref 1.005–1.030)
pH: 5.5 (ref 5.0–8.0)

## 2020-12-22 LAB — URINALYSIS, MICROSCOPIC (REFLEX)

## 2020-12-22 LAB — I-STAT BETA HCG BLOOD, ED (MC, WL, AP ONLY): I-stat hCG, quantitative: <5 m[IU]/mL (ref <5)

## 2020-12-22 LAB — BASIC METABOLIC PANEL
Anion gap: 11 (ref 5–15)
BUN: 9 mg/dL (ref 6–20)
CO2: 24 mmol/L (ref 22–32)
Calcium: 9.8 mg/dL (ref 8.9–10.3)
Chloride: 104 mmol/L (ref 98–111)
Creatinine, Ser: 0.59 mg/dL (ref 0.44–1.00)
GFR, Estimated: >60 mL/min (ref >60)
Glucose, Bld: 98 mg/dL (ref 70–99)
Potassium: 4.2 mmol/L (ref 3.5–5.1)
Sodium: 139 mmol/L (ref 135–145)

## 2020-12-22 NOTE — ED Triage Notes (Signed)
Pt arriving with right side flank pain that began approx 1 hour ago. Pt reports she has had this happen off and on for the past year.

## 2020-12-22 NOTE — ED Notes (Signed)
Pt told screeners she was leaving.  °

## 2020-12-23 ENCOUNTER — Telehealth (INDEPENDENT_AMBULATORY_CARE_PROVIDER_SITE_OTHER): Payer: BC Managed Care – PPO | Admitting: Physician Assistant

## 2020-12-23 ENCOUNTER — Telehealth: Payer: Self-pay | Admitting: Physician Assistant

## 2020-12-23 ENCOUNTER — Encounter: Payer: Self-pay | Admitting: Physician Assistant

## 2020-12-23 DIAGNOSIS — R1031 Right lower quadrant pain: Secondary | ICD-10-CM | POA: Diagnosis not present

## 2020-12-23 LAB — URINE CULTURE: Culture: <10000 — AB

## 2020-12-23 NOTE — Progress Notes (Signed)
I have discussed the procedure for the virtual visit with the patient who has given consent to proceed with assessment and treatment.    S , CMA     

## 2020-12-23 NOTE — Progress Notes (Signed)
Virtual Visit via Video   I connected with patient on 12/23/20 at  3:00 PM EST by a video enabled telemedicine application and verified that I am speaking with the correct person using two identifiers.  Location patient: Home Location provider: [facility], Office Persons participating in the virtual visit: Patient, Provider, CMA ([name])  I discussed the limitations of evaluation and management by telemedicine and the availability of in person appointments. The patient expressed understanding and agreed to proceed.  Subjective:   HPI:   Patient presents via [facility] today for ER follow-up.  Patient presented to the ER yesterday for severe right lower abdominopelvic pain that has been present for some time but acutely worsened.  Patient states there was a long wait in the ER so she was triaged and placed back in the waiting room.  Did have lab work drawn while in the waiting room.  States she was in so much pain she couldn't stay there and so she left.  Was contacted by the ER later with her lab results which she would like to discuss today.  Patient states she spoke with family members who told her she needed no further evaluation and not to delay care.  Patient noticed that she really has had about 2 years of intermittent pelvic pain.  Has history of ovarian cyst so she has seen her GYN for this previously noting a work-up that was overall unremarkable.  Over the past couple of months has been noticing some bilateral lower abdominal pain transitioning to more severe right lower quadrant pain yesterday.  Notes some mild nausea without vomiting.  Denies melena, hematochezia or tenesmus.  Currently still noticing some pain that she states is around her on the local area and down to the right lower quadrant.  Denies any urinary urgency, frequency, dysuria or hematuria.  Denies any vaginal pain or bleeding.  ROS:   See pertinent positives and negatives per HPI.  Patient Active  Problem List   Diagnosis Date Noted  . 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 Polycystic ovary syndrome 12/24/2019  . Vaginal delivery 02/29/2016  . Anxiety - dx'd at age 51 02/28/2016  . Eczema 02/28/2016  . IBS (irritable bowel syndrome) 02/28/2016  . Scoliosis 02/28/2016  . Seasonal allergies 02/28/2016  . Allergy history, drug -- Compazine (dystonic rxn; seizures) 02/27/2016  . High-risk pregnancy supervision 10/27/2015  . Von Willebrand disease, type I ([facility]) 07/11/2015  . 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 Asthma 07/11/2015    Social History   Tobacco Use  . Smoking status: Former Smoker    Quit date: 05/12/2015    Years since quitting: 5.6  . Smokeless tobacco: Never Used  Substance Use Topics  . Alcohol use: Yes    Alcohol/week: 0.0 standard drinks    Comment: 2-3 beers per day    Current Outpatient Medications:  .  albuterol (VENTOLIN HFA) 108 (90 Base) MCG/ACT inhaler, INHALE 1 PUFF INTO THE LUNGS EVERY 6 (SIX) HOURS AS NEEDED FOR WHEEZING OR SHORTNESS OF BREATH., Disp: 6.7 g, Rfl: 5 .  amphetamine-dextroamphetamine (ADDERALL XR) 30 MG 24 hr capsule, Take 1 capsule (30 mg total) by mouth every morning., Disp: 30 capsule, Rfl: 0 .  buPROPion (WELLBUTRIN XL) 300 MG 24 hr tablet, Take 1 tablet (300 mg total) by mouth daily., Disp: 30 tablet, Rfl: 1 .  clonazePAM (KLONOPIN) 0.5 MG tablet, Take 0.5 tablets (0.25 mg total) by mouth 2 (two) times daily as needed for anxiety., Disp: 30  tablet, Rfl: 0 .  spironolactone (ALDACTONE) 25 MG tablet, Take 50 mg by mouth daily., Disp: , Rfl:  .  vitamin C (ASCORBIC ACID) 500 MG tablet, Take 1 tablet by mouth daily., Disp: , Rfl:   Allergies  Allergen Reactions  . Aspirin Other (See Comments)    "Thins bloods too much"  . Compazine [Prochlorperazine Edisylate]     Dystonic.  Marland Kitchen Other     Compazine    Objective:   LMP 12/17/2020   Patient is well-developed, well-nourished in no acute distress.  Resting  comfortably at home.  Head is normocephalic, atraumatic.  No labored breathing.  Speech is clear and coherent with logical content.  Patient is alert and oriented at baseline.   Assessment and Plan:   1. Right lower quadrant abdominal pain Seems to be subacute with an acute exacerbation.  Again discussed with her she is in that severe pain she should not leave the ER as that is where she needs to be.  Now symptoms have lessened on their own.  Reviewed her lab results from the ER with her.  CBC showed some evidence of hemoconcentration.  No leukocytosis.  Overall unremarkable urinalysis and negative culture.  Normal electrolytes, glucose and kidney function.  We'll plan to repeat her CBC with her well-hydrated along with checking a hepatic function panel.  We'll proceed with an urgent CT abdomen pelvis to make sure there is no diverticulitis or smoldering appendicitis present.  We'll try to get this done as soon as possible but discussed with her we have to have insurance approval first.  Strict ER precautions discussed with patient who voiced understanding and agreement with plan. - Hepatic function panel; Future - CBC w/Diff; Future - CT Abdomen Pelvis W Contrast; Future    Leeanne Rio, PA-C 12/23/2020

## 2020-12-23 NOTE — Telephone Encounter (Signed)
Patient went to ED last night for pain in abdomen - she left without being seen but they did do blood work and urinalysis - Patient got results but does not understand them.  Please look at results and call patient back.

## 2020-12-23 NOTE — Telephone Encounter (Signed)
PCP advised that patient would need an appointment to discuss since we did not order test. Patient is scheduled for today at 3p

## 2020-12-24 ENCOUNTER — Other Ambulatory Visit: Payer: BC Managed Care – PPO

## 2020-12-25 ENCOUNTER — Other Ambulatory Visit: Payer: Self-pay

## 2020-12-25 ENCOUNTER — Ambulatory Visit
Admission: RE | Admit: 2020-12-25 | Discharge: 2020-12-25 | Disposition: A | Discharge status: Home / Self Care | Payer: BC Managed Care – PPO | Source: Ambulatory Visit | Attending: Physician Assistant | Admitting: Physician Assistant

## 2020-12-25 DIAGNOSIS — R1031 Right lower quadrant pain: Secondary | ICD-10-CM

## 2020-12-25 MED ORDER — IOPAMIDOL (ISOVUE-300) INJECTION 61%
100.0000 mL | Freq: Once | INTRAVENOUS | Status: AC | PRN
  Administered 2020-12-25: 100 mL via INTRAVENOUS

## 2020-12-26 ENCOUNTER — Encounter: Payer: Self-pay | Admitting: Physician Assistant

## 2020-12-26 ENCOUNTER — Telehealth: Payer: Self-pay | Admitting: Physician Assistant

## 2020-12-26 NOTE — Telephone Encounter (Signed)
Will schedule patient for Monday for in office evaluation.

## 2020-12-26 NOTE — Telephone Encounter (Signed)
See result notes. Will contact patient with results.

## 2020-12-26 NOTE — Telephone Encounter (Signed)
Patient had her ct scan completed - she states that it shows lesions on her liver & kidneys.  Patient states that she is in severe pain and that tylenol and ibuprofen are not touching the pain.  Please advise.

## 2020-12-28 ENCOUNTER — Other Ambulatory Visit: Payer: Self-pay | Admitting: Physician Assistant

## 2020-12-28 MED ORDER — CEFDINIR 300 MG PO CAPS: 300.0000 mg | ORAL_CAPSULE | Freq: Two times a day (BID) | ORAL | 0 refills | Status: DC

## 2020-12-29 ENCOUNTER — Other Ambulatory Visit (HOSPITAL_COMMUNITY)
Admission: RE | Admit: 2020-12-29 | Discharge: 2020-12-29 | Disposition: A | Discharge status: Home / Self Care | Payer: BC Managed Care – PPO | Source: Ambulatory Visit | Attending: Physician Assistant | Admitting: Physician Assistant

## 2020-12-29 ENCOUNTER — Other Ambulatory Visit: Payer: Self-pay

## 2020-12-29 ENCOUNTER — Ambulatory Visit (INDEPENDENT_AMBULATORY_CARE_PROVIDER_SITE_OTHER): Payer: BC Managed Care – PPO | Admitting: Physician Assistant

## 2020-12-29 ENCOUNTER — Other Ambulatory Visit: Payer: Self-pay | Admitting: Physician Assistant

## 2020-12-29 ENCOUNTER — Encounter: Payer: Self-pay | Admitting: Physician Assistant

## 2020-12-29 DIAGNOSIS — R109 Unspecified abdominal pain: Secondary | ICD-10-CM | POA: Diagnosis not present

## 2020-12-29 DIAGNOSIS — N898 Other specified noninflammatory disorders of vagina: Secondary | ICD-10-CM

## 2020-12-29 DIAGNOSIS — M25551 Pain in right hip: Secondary | ICD-10-CM | POA: Diagnosis not present

## 2020-12-29 DIAGNOSIS — F988 Other specified behavioral and emotional disorders with onset usually occurring in childhood and adolescence: Secondary | ICD-10-CM

## 2020-12-29 LAB — POCT URINALYSIS DIPSTICK
Bilirubin, UA: NEGATIVE
Blood, UA: NEGATIVE
Glucose, UA: NEGATIVE
Ketones, UA: NEGATIVE
Leukocytes, UA: NEGATIVE
Nitrite, UA: NEGATIVE
Protein, UA: NEGATIVE
Spec Grav, UA: 1.01 (ref 1.010–1.025)
Urobilinogen, UA: 0.2 E.U./dL
pH, UA: 6.5 (ref 5.0–8.0)

## 2020-12-29 MED ORDER — AMPHETAMINE-DEXTROAMPHET ER 30 MG PO CP24
30.0000 mg | ORAL_CAPSULE | ORAL | 0 refills | Status: DC
Start: 2020-12-29 — End: 2021-01-28

## 2020-12-29 NOTE — Progress Notes (Signed)
Patient presents to clinic today to follow-up regarding her R "flank" pain. Patient has had negative ER workup including CT abd/pelvis, labs and UA. Sent a message yesterday noting some dysuria and foul-smelling urine so was given Rx Keflex to start until she could come in for further evaluation and repeat assessment of urine.   Past Medical History:  Diagnosis Date  . Anxiety    age 35  . Asthma   . Depression   . Eczema   . High-risk pregnancy supervision 10/27/2015  . 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 History of OCD (obsessive compulsive disorder)   . IBS (irritable bowel syndrome)   . Scoliosis   . Von Willebrand disease ([place])     Current Outpatient Medications on File Prior to Visit  Medication Sig Dispense Refill  . albuterol (VENTOLIN HFA) 108 (90 Base) MCG/ACT inhaler INHALE 1 PUFF INTO THE LUNGS EVERY 6 (SIX) HOURS AS NEEDED FOR WHEEZING OR SHORTNESS OF BREATH. 6.7 g 5  . amphetamine-dextroamphetamine (ADDERALL XR) 30 MG 24 hr capsule Take 1 capsule (30 mg total) by mouth every morning. 30 capsule 0  . buPROPion (WELLBUTRIN XL) 300 MG 24 hr tablet Take 1 tablet (300 mg total) by mouth daily. 30 tablet 1  . cefdinir (OMNICEF) 300 MG capsule Take 1 capsule (300 mg total) by mouth 2 (two) times daily. 14 capsule 0  . clonazePAM (KLONOPIN) 0.5 MG tablet Take 0.5 tablets (0.25 mg total) by mouth 2 (two) times daily as needed for anxiety. 30 tablet 0  . spironolactone (ALDACTONE) 25 MG tablet Take 50 mg by mouth daily.    . vitamin C (ASCORBIC ACID) 500 MG tablet Take 1 tablet by mouth daily.     No current facility-administered medications on file prior to visit.    Allergies  Allergen Reactions  . Aspirin Other (See Comments)    "Thins bloods too much"  . Compazine [Prochlorperazine Edisylate]     Dystonic.  Marland Kitchen Other     Compazine    Family History  Problem Relation Age of Onset  . Asthma Mother   . Hypertension Mother   . Cancer Mother        Type of Skin Cancer   . Diabetes Mother   . Depression Mother   . Heart disease Mother   . Varicose Veins Mother   . Multiple sclerosis Mother   . Cancer Father        Skin  . Deep vein thrombosis Father   . Asthma Sister   . Hypertension Sister   . Depression Sister   . Learning disabilities Sister   . Varicose Veins Sister   . Cancer Maternal Grandmother   . Cancer Paternal Grandfather        lung  . Asthma Sister   . Depression Sister   . Deafness Daughter     Social History   Socioeconomic History  . Marital status: Married    Spouse name: Not on file  . Number of children: Not on file  . Years of education: Not on file  . Highest education level: Not on file  Occupational History  . Not on file  Tobacco Use  . Smoking status: Former Smoker    Quit date: 05/12/2015    Years since quitting: 5.6  . Smokeless tobacco: Never Used  Vaping Use  . Vaping Use: Never used  Substance and Sexual Activity  . Alcohol use: Yes    Alcohol/week: 0.0 standard drinks  Comment: 2-3 beers per day  . Drug use: No  . Sexual activity: Yes    Birth control/protection: None  Other Topics Concern  . Not on file  Social History Narrative  . Not on file   Social Determinants of Health   Financial Resource Strain: Not on file  Food Insecurity: Not on file  Transportation Needs: Not on file  Physical Activity: Not on file  Stress: Not on file  Social Connections: Not on file   Review of Systems - See HPI.  All other ROS are negative.  Resp 16   Ht 5\' 7"  (1.702 m)   Wt 179 lb (81.2 kg)   LMP 12/17/2020   BMI 28.04 kg/m   Physical Exam Vitals reviewed.  Constitutional:      Appearance: Normal appearance.  HENT:     Head: Normocephalic and atraumatic.  Cardiovascular:     Rate and Rhythm: Normal rate and regular rhythm.     Pulses: Normal pulses.     Heart sounds: Normal heart sounds.  Pulmonary:     Effort: Pulmonary effort is normal.     Breath sounds: Normal breath sounds.   Abdominal:     General: Bowel sounds are normal. There is no distension.     Palpations: Abdomen is soft.     Tenderness: There is no abdominal tenderness. There is no right CVA tenderness or left CVA tenderness.  Musculoskeletal:     Cervical back: Neck supple.     Right hip: Tenderness (pain with abduction of hip and rotation.) present. No bony tenderness or crepitus. Normal strength.  Neurological:     Mental Status: She is alert.     Recent Results (from the past 2160 hour(s))  Urinalysis, Routine w reflex microscopic Urine, Clean Catch     Status: Abnormal   Collection Time: 12/22/20  7:48 PM  Result Value Ref Range   Color, Urine YELLOW YELLOW   APPearance CLEAR CLEAR   Specific Gravity, Urine >1.030 (H) 1.005 - 1.030   pH 5.5 5.0 - 8.0   Glucose, UA NEGATIVE NEGATIVE mg/dL   Hgb urine dipstick TRACE (A) NEGATIVE   Bilirubin Urine NEGATIVE NEGATIVE   Ketones, ur NEGATIVE NEGATIVE mg/dL   Protein, ur NEGATIVE NEGATIVE mg/dL   Nitrite NEGATIVE NEGATIVE   Leukocytes,Ua NEGATIVE NEGATIVE    Comment: Performed at [facility] [address], [place], [place] [address]  Urine C&S     Status: Abnormal   Collection Time: 12/22/20  7:48 PM   Specimen: Urine, Clean Catch  Result Value Ref Range   Specimen Description      URINE, CLEAN CATCH Performed at [facility], [place] [address] [place], [place] [address]    Special Requests      NONE Performed at [facility], [place] [address], [place], [place] [address]    Culture (A)     <10,000 COLONIES/mL INSIGNIFICANT GROWTH Performed at [facility] [address] [place], [place] [address]    Report Status 12/23/2020 FINAL   Urinalysis, Microscopic (reflex)     Status: Abnormal   Collection Time: 12/22/20  7:48 PM  Result Value Ref Range   RBC / HPF 0-5 0 - 5 RBC/hpf   WBC, UA 0-5 0 - 5 WBC/hpf   Bacteria, UA FEW (A) NONE SEEN   Squamous Epithelial / LPF 0-5 0  - 5    Comment: Performed at [facility], [place] [address]  [place], [place] [id number]  Basic metabolic panel     Status: None   Collection Time: 12/22/20  8:00 PM  Result Value Ref Range   Sodium 139 135 - 145 mmol/L   Potassium 4.2 3.5 - 5.1 mmol/L   Chloride 104 98 - 111 mmol/L   CO2 24 22 - 32 mmol/L   Glucose, Bld 98 70 - 99 mg/dL    Comment: Glucose reference range applies only to samples taken after fasting for at least 8 hours.   BUN 9 6 - 20 mg/dL   Creatinine, Ser 0.59 0.44 - 1.00 mg/dL   Calcium 9.8 8.9 - 10.3 mg/dL   GFR, Estimated >60 >60 mL/min    Comment: (NOTE) Calculated using the CKD-EPI Creatinine Equation (2021)    Anion gap 11 5 - 15    Comment: Performed at [facility], [place] [address] [place], [place] [address]  CBC     Status: Abnormal   Collection Time: 12/22/20  8:00 PM  Result Value Ref Range   WBC 8.5 4.0 - 10.5 K/uL   RBC 5.12 (H) 3.87 - 5.11 MIL/uL   Hemoglobin 12.3 12.0 - 15.0 g/dL   HCT 40.2 36.0 - 46.0 %   MCV 78.5 (L) 80.0 - 100.0 fL   MCH 24.0 (L) 26.0 - 34.0 pg   MCHC 30.6 30.0 - 36.0 g/dL   RDW 17.7 (H) 11.5 - 15.5 %   Platelets 437 (H) 150 - 400 K/uL   nRBC 0.0 0.0 - 0.2 %    Comment: Performed at [facility], [place] [address], [place], [place] [address]  I-Stat beta hCG blood, ED     Status: None   Collection Time: 12/22/20  8:03 PM  Result Value Ref Range   I-stat hCG, quantitative <5.0 <5 mIU/mL   Comment 3            Comment:   GEST. AGE      CONC.  (mIU/mL)   <=1 WEEK        5 - 50     2 WEEKS       50 - 500     3 WEEKS       100 - 10,000     4 WEEKS     1,000 - 30,000        FEMALE AND NON-PREGNANT FEMALE:     LESS THAN 5 mIU/mL     Assessment/Plan: 1. Flank pain, acute Negative ER workup including CT scan. Repeat UA unremarkable. Will check culture and ancillary testing to assess for BV. Her pain really seems to be localized to her hip. She has chronic history of  scoliosis and low back pain. Could be related. Discussed with her referral to ortho as I think that is why her workup has been unremarkable. She has follow-up with her GI provider as well.  - POCT Urinalysis Dipstick - Urine Culture; Future - Urine cytology ancillary only([place])  2. Right hip pain Discussed treatment with patient. Pain relievers reviewed. Gentle stretching discussed. Referral to Ortho giving chronicity of symptoms. - AMB referral to orthopedics  This visit occurred during the SARS-CoV-2 public health emergency.  Safety protocols were in place, including screening questions prior to the visit, additional usage of staff PPE, and extensive cleaning of exam room while observing appropriate contact time as indicated for disinfecting solutions.     Leeanne Rio, PA-C

## 2020-12-29 NOTE — Patient Instructions (Signed)
Your urine looks good today.  I have sent for culture and ancillary testing for BV.  We will treat based on results. For now, ok to stop the antibiotic.   I feel pain is stemming from your hip and SI joints. I want you to start OTC tylenol arthritis and topical voltaren to the areas of pain.  I am setting you up with an Orthopedist for further evaluation and management.

## 2020-12-29 NOTE — Telephone Encounter (Signed)
Pt called in asking for a refill on the Adderall XR, [facility] [place] and can be reached at the home #

## 2020-12-29 NOTE — Telephone Encounter (Signed)
Adderall XR last rx 11/26/20 #30 LOV: 12/29/20 Flank pain, Hip pain

## 2020-12-31 ENCOUNTER — Encounter: Payer: Self-pay | Admitting: *Deleted

## 2020-12-31 LAB — URINE CYTOLOGY ANCILLARY ONLY
Bacterial Vaginitis-Urine: NEGATIVE
Candida Urine: NEGATIVE
Chlamydia: NEGATIVE
Comment: NEGATIVE
Comment: NEGATIVE
Comment: NORMAL
Neisseria Gonorrhea: NEGATIVE
Trichomonas: NEGATIVE

## 2020-12-31 NOTE — Addendum Note (Signed)
Addended by: Leonidas Romberg on: 12/31/2020 02:22 PM   Modules accepted: Orders

## 2021-01-01 ENCOUNTER — Other Ambulatory Visit (INDEPENDENT_AMBULATORY_CARE_PROVIDER_SITE_OTHER): Payer: BC Managed Care – PPO

## 2021-01-01 ENCOUNTER — Other Ambulatory Visit: Payer: Self-pay

## 2021-01-01 DIAGNOSIS — D6801 Von willebrand disease, type 1: Secondary | ICD-10-CM

## 2021-01-01 DIAGNOSIS — R1031 Right lower quadrant pain: Secondary | ICD-10-CM

## 2021-01-01 DIAGNOSIS — R109 Unspecified abdominal pain: Secondary | ICD-10-CM

## 2021-01-01 DIAGNOSIS — N898 Other specified noninflammatory disorders of vagina: Secondary | ICD-10-CM

## 2021-01-01 DIAGNOSIS — R5382 Chronic fatigue, unspecified: Secondary | ICD-10-CM | POA: Diagnosis not present

## 2021-01-01 DIAGNOSIS — D680 Von Willebrand's disease: Secondary | ICD-10-CM | POA: Diagnosis not present

## 2021-01-01 LAB — CBC WITH DIFFERENTIAL/PLATELET
Basophils Absolute: 0.1 10*3/uL (ref 0.0–0.1)
Basophils Relative: 1.1 % (ref 0.0–3.0)
Eosinophils Absolute: 0.2 10*3/uL (ref 0.0–0.7)
Eosinophils Relative: 2.5 % (ref 0.0–5.0)
HCT: 35.7 % — ABNORMAL LOW (ref 36.0–46.0)
Hemoglobin: 11.4 g/dL — ABNORMAL LOW (ref 12.0–15.0)
Lymphocytes Relative: 30.8 % (ref 12.0–46.0)
Lymphs Abs: 1.9 10*3/uL (ref 0.7–4.0)
MCHC: 31.9 g/dL (ref 30.0–36.0)
MCV: 75 fl — ABNORMAL LOW (ref 78.0–100.0)
Monocytes Absolute: 0.6 10*3/uL (ref 0.1–1.0)
Monocytes Relative: 10 % (ref 3.0–12.0)
Neutro Abs: 3.5 10*3/uL (ref 1.4–7.7)
Neutrophils Relative %: 55.6 % (ref 43.0–77.0)
Platelets: 411 10*3/uL — ABNORMAL HIGH (ref 150.0–400.0)
RBC: 4.76 Mil/uL (ref 3.87–5.11)
RDW: 18.3 % — ABNORMAL HIGH (ref 11.5–15.5)
WBC: 6.2 10*3/uL (ref 4.0–10.5)

## 2021-01-01 LAB — HEPATIC FUNCTION PANEL
ALT: 20 U/L (ref 0–35)
AST: 28 U/L (ref 0–37)
Albumin: 4.4 g/dL (ref 3.5–5.2)
Alkaline Phosphatase: 59 U/L (ref 39–117)
Bilirubin, Direct: 0.1 mg/dL (ref 0.0–0.3)
Total Bilirubin: 0.4 mg/dL (ref 0.2–1.2)
Total Protein: 7.5 g/dL (ref 6.0–8.3)

## 2021-01-01 LAB — B12 AND FOLATE PANEL
Folate: 17.1 ng/mL (ref >5.9)
Vitamin B-12: 340 pg/mL (ref 211–911)

## 2021-01-01 LAB — TSH: TSH: 2.38 u[IU]/mL (ref 0.35–4.50)

## 2021-01-01 LAB — VITAMIN D 25 HYDROXY (VIT D DEFICIENCY, FRACTURES): VITD: 22.84 ng/mL — ABNORMAL LOW (ref 30.00–100.00)

## 2021-01-02 LAB — URINE CULTURE
MICRO NUMBER:: 11414639
Result:: NO GROWTH
SPECIMEN QUALITY:: ADEQUATE

## 2021-01-02 LAB — IRON,TIBC AND FERRITIN PANEL
%SAT: 11 % (calc) — ABNORMAL LOW (ref 16–45)
Ferritin: 7 ng/mL — ABNORMAL LOW (ref 16–154)
Iron: 55 ug/dL (ref 40–190)
TIBC: 500 mcg/dL (calc) — ABNORMAL HIGH (ref 250–450)

## 2021-01-05 ENCOUNTER — Encounter: Payer: Self-pay | Admitting: Emergency Medicine

## 2021-01-05 ENCOUNTER — Other Ambulatory Visit: Payer: Self-pay | Admitting: Emergency Medicine

## 2021-01-05 ENCOUNTER — Telehealth: Payer: Self-pay | Admitting: Emergency Medicine

## 2021-01-05 DIAGNOSIS — E559 Vitamin D deficiency, unspecified: Secondary | ICD-10-CM

## 2021-01-05 DIAGNOSIS — D509 Iron deficiency anemia, unspecified: Secondary | ICD-10-CM

## 2021-01-05 MED ORDER — VITAMIN D (ERGOCALCIFEROL) 1.25 MG (50000 UNIT) PO CAPS: ORAL_CAPSULE | ORAL | 3 refills | Status: DC

## 2021-01-05 MED ORDER — FERROUS SULFATE 325 (65 FE) MG PO TBEC: 325.0000 mg | DELAYED_RELEASE_TABLET | Freq: Every day | ORAL | 3 refills | Status: DC

## 2021-01-05 NOTE — Telephone Encounter (Signed)
While I was giving patient her lab results she states is having severe constipation. She has not had a bowel movement in over a week. She is taking OTC medication with no success.  She states she has seen GI in the past for IBS.  Patient wants to know what recommendations to help with constipation.

## 2021-01-05 NOTE — Telephone Encounter (Signed)
Called patient and advised of PCP recommendations. Also sent in My chart message so patient can see it as well. She is agreeable.

## 2021-01-05 NOTE — Telephone Encounter (Signed)
Recommend the following:  I encourage you to increase hydration and the amount of fiber in your diet.  Start a daily probiotic (Align, Culturelle, Digestive Advantage, etc.). If no bowel movement within 24 hours, take 2 Tbs of Milk of Magnesia in a 4 oz glass of warmed prune juice every 2-3 days to help promote bowel movement. If no results within 24 hours, then repeat above regimen, adding a Dulcolax stool softener to regimen. If this does not promote a bowel movement, please call the office.

## 2021-01-09 ENCOUNTER — Ambulatory Visit: Payer: BC Managed Care – PPO | Admitting: Orthopaedic Surgery

## 2021-01-09 ENCOUNTER — Other Ambulatory Visit: Payer: Self-pay | Admitting: Physician Assistant

## 2021-01-09 ENCOUNTER — Telehealth: Payer: Self-pay | Admitting: Physician Assistant

## 2021-01-09 MED ORDER — CLONAZEPAM 0.5 MG PO TABS: 0.2500 mg | ORAL_TABLET | Freq: Two times a day (BID) | ORAL | 0 refills | Status: DC | PRN

## 2021-01-09 NOTE — Telephone Encounter (Signed)
Pt called in asking for a refill on the Klonopin [facility] [place]

## 2021-01-09 NOTE — Telephone Encounter (Signed)
Refill sent to pharmacy.   

## 2021-01-15 ENCOUNTER — Ambulatory Visit (INDEPENDENT_AMBULATORY_CARE_PROVIDER_SITE_OTHER): Payer: BC Managed Care – PPO | Admitting: Orthopaedic Surgery

## 2021-01-15 ENCOUNTER — Encounter: Payer: Self-pay | Admitting: Orthopaedic Surgery

## 2021-01-15 ENCOUNTER — Ambulatory Visit: Payer: Self-pay

## 2021-01-15 ENCOUNTER — Other Ambulatory Visit: Payer: Self-pay

## 2021-01-15 DIAGNOSIS — M25552 Pain in left hip: Secondary | ICD-10-CM

## 2021-01-15 DIAGNOSIS — M25551 Pain in right hip: Secondary | ICD-10-CM | POA: Diagnosis not present

## 2021-01-15 DIAGNOSIS — M5442 Lumbago with sciatica, left side: Secondary | ICD-10-CM | POA: Diagnosis not present

## 2021-01-15 DIAGNOSIS — G8929 Other chronic pain: Secondary | ICD-10-CM | POA: Diagnosis not present

## 2021-01-15 NOTE — Progress Notes (Signed)
Office Visit Note   Patient: Kendra Patton           Date of Birth: 10-13-1986           MRN: [id number] Visit Date: 01/15/2021              Requested by: Brunetta Jeans, PA-C [address] [place],  [place] [id number] PCP: Brunetta Jeans, PA-C   Assessment & Plan: Visit Diagnoses:  1. Chronic left-sided low back pain with left-sided sciatica   2. 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  I would like to refer her to Dr. Junius Roads for diagnostic and hopefully therapeutic intra-articular cortisone injection to the right hip joint.  She will follow up with [place] as needed.  Follow-Up Instructions: Return if symptoms worsen or fail to improve.   Orders:  Orders Placed This Encounter  Procedures  . XR HIP UNILAT W OR W/O PELVIS 2-3 VIEWS RIGHT  . XR Lumbar Spine 2-3 Views  . US Guided Needle Placement - No Linked Charges   No orders of the defined types were placed in this encounter.     Procedures: No procedures performed   Clinical Data: No additional findings.   Subjective: Chief Complaint  Patient presents with  . Lower Back - Pain  . Right Hip - Pain    HPI patient is a pleasant 35 year old female who comes in today with right hip pain for almost 10 years.  The pain she has has become more constant over the past 2 years.  Majority of the pain is to the groin but does note pain at times into the buttocks.  She has an occasional electric shock sensation which goes from the anterior thigh all the way to her knee and at times her foot.  Her pain is worse at night as well as after she has been standing for longer than 10 minutes as well as getting in and out of a car.  She has been taking ibuprofen without significant relief of symptoms.  She has been seen by chiropractor in the past which is not improved her symptoms.  She does have a history of  scoliosis and has had cortisone injections in her back.  No previous hip injections.  Review of Systems as detailed in HPI.  All others reviewed and are negative.   Objective: Vital Signs: LMP 12/17/2020   Physical Exam well-developed well-nourished female no acute distress.  Alert and oriented x3.  Ortho Exam right hip exam shows a moderately positive logroll and FADIR.  Positive straight leg raise.  She has increased pain with resisted hip flexion.  No focal weakness.  She is neurovascularly intact distally.  Specialty Comments:  No specialty comments available.  Imaging: XR HIP UNILAT W OR W/O PELVIS 2-3 VIEWS RIGHT  Result Date: 01/15/2021 X-rays demonstrate a pincer deformity of the right hip  XR Lumbar Spine 2-3 Views  Result Date: 01/15/2021 No acute or structural abnormalities    PMFS History: Patient Active Problem List   Diagnosis Date Noted  . 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 Polycystic ovary syndrome 12/24/2019  . Vaginal delivery 02/29/2016  . Anxiety - dx'd at age 77 02/28/2016  . Eczema 02/28/2016  . IBS (irritable bowel syndrome) 02/28/2016  . Scoliosis 02/28/2016  . Seasonal allergies 02/28/2016  . Allergy history, drug --  Compazine (dystonic rxn; seizures) 02/27/2016  . High-risk pregnancy supervision 10/27/2015  . Von Willebrand disease, type I ([place]) 07/11/2015  . 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 Asthma 07/11/2015   Past Medical History:  Diagnosis Date  . Anxiety    age 72  . Asthma   . Depression   . Eczema   . High-risk pregnancy supervision 10/27/2015  . 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 History of OCD (obsessive compulsive disorder)   . IBS (irritable bowel syndrome)   . Scoliosis   . Von Willebrand disease ([place])     Family History  Problem Relation Age of Onset  . Asthma Mother   . Hypertension Mother   . Cancer Mother        Type of Skin Cancer  . Diabetes Mother   . Depression Mother   . Heart  disease Mother   . Varicose Veins Mother   . Multiple sclerosis Mother   . Cancer Father        Skin  . Deep vein thrombosis Father   . Asthma Sister   . Hypertension Sister   . Depression Sister   . Learning disabilities Sister   . Varicose Veins Sister   . Cancer Maternal Grandmother   . Cancer Paternal Grandfather        lung  . Asthma Sister   . Depression Sister   . Deafness Daughter     Past Surgical History:  Procedure Laterality Date  . WISDOM TOOTH EXTRACTION     Social History   Occupational History  . Not on file  Tobacco Use  . Smoking status: Former Smoker    Quit date: 05/12/2015    Years since quitting: 5.6  . Smokeless tobacco: Never Used  Vaping Use  . Vaping Use: Never used  Substance and Sexual Activity  . Alcohol use: Yes    Alcohol/week: 0.0 standard drinks    Comment: 2-3 beers per day  . Drug use: No  . Sexual activity: Yes    Birth control/protection: None

## 2021-01-15 NOTE — Progress Notes (Signed)
Subjective: Patient is here for ultrasound-guided intra-articular right hip injection.   Chronic pain, possible labrum tear.  Objective:  Pain with passive IR.  Procedure: Ultrasound guided injection is preferred based studies that show increased duration, increased effect, greater accuracy, decreased procedural pain, increased response rate, and decreased cost with ultrasound guided versus blind injection.   Verbal informed consent obtained.  Time-out conducted.  Noted no overlying erythema, induration, or other signs of local infection. Ultrasound-guided right hip injection: After sterile prep with Betadine, injected 4 cc 0.25% bupivacaine without epinephrine and 6 mg betamethasone using a 22-gauge spinal needle, passing the needle through the iliofemoral ligament into the femoral head/neck junction.  Injectate was seen filling the joint capsule.  She did have what appeared to be a tear of the anterior labrum.  She did not have much relief during the immediate anesthetic phase.  She will keep note of how she feels over the next several hours and report back to Dr. Erlinda Hong.

## 2021-01-20 ENCOUNTER — Other Ambulatory Visit: Payer: Self-pay | Admitting: Physician Assistant

## 2021-01-28 ENCOUNTER — Other Ambulatory Visit: Payer: Self-pay | Admitting: Physician Assistant

## 2021-01-28 DIAGNOSIS — F988 Other specified behavioral and emotional disorders with onset usually occurring in childhood and adolescence: Secondary | ICD-10-CM

## 2021-01-28 MED ORDER — AMPHETAMINE-DEXTROAMPHET ER 30 MG PO CP24
30.0000 mg | ORAL_CAPSULE | ORAL | 0 refills | Status: DC
Start: 2021-01-28 — End: 2021-02-25

## 2021-01-28 NOTE — Telephone Encounter (Signed)
Adderall last rx 12/29/20 #30 LOV: 12/29/20 Flank pain

## 2021-01-28 NOTE — Telephone Encounter (Signed)
Pt called in asking for a new script of the adderall to be sent in to [facility] [place], pt can be reached at the home #

## 2021-01-30 ENCOUNTER — Other Ambulatory Visit: Payer: Self-pay | Admitting: Physician Assistant

## 2021-02-10 ENCOUNTER — Other Ambulatory Visit: Payer: Self-pay | Admitting: Physician Assistant

## 2021-02-10 NOTE — Telephone Encounter (Signed)
Patient needs her Clonapin refilled - [facility] [place]

## 2021-02-11 MED ORDER — CLONAZEPAM 0.5 MG PO TABS: 0.2500 mg | ORAL_TABLET | Freq: Two times a day (BID) | ORAL | 0 refills | Status: DC | PRN

## 2021-02-11 NOTE — Telephone Encounter (Signed)
Clonazepam last rx 01/09/21 #30 LOV: 12/29/20

## 2021-02-13 ENCOUNTER — Other Ambulatory Visit: Payer: Self-pay

## 2021-02-13 ENCOUNTER — Ambulatory Visit (INDEPENDENT_AMBULATORY_CARE_PROVIDER_SITE_OTHER): Payer: BC Managed Care – PPO | Admitting: Orthopaedic Surgery

## 2021-02-13 DIAGNOSIS — M25551 Pain in right hip: Secondary | ICD-10-CM | POA: Diagnosis not present

## 2021-02-13 MED ORDER — LIDOCAINE HCL 1 % IJ SOLN
2.0000 mL | INTRAMUSCULAR | Status: AC | PRN
  Administered 2021-02-13: 2 mL

## 2021-02-13 MED ORDER — BUPIVACAINE HCL 0.5 % IJ SOLN
2.0000 mL | INTRAMUSCULAR | Status: AC | PRN
  Administered 2021-02-13: 2 mL via INTRA_ARTICULAR

## 2021-02-13 MED ORDER — METHYLPREDNISOLONE ACETATE 40 MG/ML IJ SUSP
40.0000 mg | INTRAMUSCULAR | Status: AC | PRN
  Administered 2021-02-13: 40 mg via INTRA_ARTICULAR

## 2021-02-13 NOTE — Progress Notes (Signed)
Office Visit Note   Patient: Kendra Patton           Date of Birth: [birth year]           MRN: [id number] Visit Date: 02/13/2021              Requested by: Brunetta Jeans, PA-C [address] [place],  [place] [id number] PCP: Brunetta Jeans, PA-C   Assessment & Plan: Visit Diagnoses:  1. Pain in right hip     Plan: Impression is chronic right hip pain concerning for labral tear and new problem of right knee pain concerning for degenerative medial meniscal tear.  In regards to the hip we will need to obtain an MR arthrogram to evaluate for structural abnormalities.  For the right knee patient agrees to undergo cortisone injection which she tolerated well today.  We will see her back after the MRI.  Follow-Up Instructions: Return if symptoms worsen or fail to improve.   Orders:  Orders Placed This Encounter  Procedures  . MR Hip Right w/ contrast  . Arthrogram   No orders of the defined types were placed in this encounter.     Procedures: Large Joint Inj: R knee on 02/13/2021 4:03 PM Indications: pain Details: 22 G needle  Arthrogram: No  Medications: 40 mg methylPREDNISolone acetate 40 MG/ML; 2 mL lidocaine 1 %; 2 mL bupivacaine 0.5 % Consent was given by the patient. Patient was prepped and draped in the usual sterile fashion.       Clinical Data: No additional findings.   Subjective: Chief Complaint  Patient presents with  . Right Hip - Pain    Kendra Patton comes in today for follow-up of right hip pain status post cortisone injection about a month ago with Dr. Junius Roads.  She states that the injection only helped some but now she is having pain that radiates to the right knee.  She has pain around the medial side and the medial parapatellar region.  She cannot stand for too long.  She takes ibuprofen with temporary relief.  She notices cracking pain in the knee.  Denies any injuries.   Review of Systems  Constitutional: Negative.   HENT: Negative.    Eyes: Negative.   Respiratory: Negative.   Cardiovascular: Negative.   Endocrine: Negative.   Musculoskeletal: Negative.   Neurological: Negative.   Hematological: Negative.   Psychiatric/Behavioral: Negative.   All other systems reviewed and are negative.    Objective: Vital Signs: There were no vitals taken for this visit.  Physical Exam Vitals and nursing note reviewed.  Constitutional:      Appearance: She is well-developed and well-nourished.  Pulmonary:     Effort: Pulmonary effort is normal.  Skin:    General: Skin is warm.     Capillary Refill: Capillary refill takes less than 2 seconds.  Neurological:     Mental Status: She is alert and oriented to person, place, and time.  Psychiatric:        Mood and Affect: Mood and affect normal.        Behavior: Behavior normal.        Thought Content: Thought content normal.        Judgment: Judgment normal.     Ortho Exam Right hip exam is unchanged. Right knee shows no joint effusion.  Slight medial joint line tenderness.  Negative McMurray.  Collaterals and cruciates are stable. Specialty Comments:  No specialty comments available.  Imaging: No results  found.   PMFS History: Patient Active Problem List   Diagnosis Date Noted  . 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 Polycystic ovary syndrome 12/24/2019  . Vaginal delivery 02/29/2016  . Anxiety - dx'd at age 20 02/28/2016  . Eczema 02/28/2016  . IBS (irritable bowel syndrome) 02/28/2016  . Scoliosis 02/28/2016  . Seasonal allergies 02/28/2016  . Allergy history, drug -- Compazine (dystonic rxn; seizures) 02/27/2016  . High-risk pregnancy supervision 10/27/2015  . Von Willebrand disease, type I ([place]) 07/11/2015  . 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 Asthma 07/11/2015   Past Medical History:  Diagnosis Date  . Anxiety    age 62  . Asthma   . Depression   . Eczema   . High-risk pregnancy supervision 10/27/2015  . History of CMV     last pregnancy  . History of OCD (obsessive compulsive disorder)   . IBS (irritable bowel syndrome)   . Scoliosis   . Von Willebrand disease ([place])     Family History  Problem Relation Age of Onset  . Asthma Mother   . Hypertension Mother   . Cancer Mother        Type of Skin Cancer  . Diabetes Mother   . Depression Mother   . Heart disease Mother   . Varicose Veins Mother   . Multiple sclerosis Mother   . Cancer Father        Skin  . Deep vein thrombosis Father   . Asthma Sister   . Hypertension Sister   . Depression Sister   . Learning disabilities Sister   . Varicose Veins Sister   . Cancer Maternal Grandmother   . Cancer Paternal Grandfather        lung  . Asthma Sister   . Depression Sister   . Deafness Daughter     Past Surgical History:  Procedure Laterality Date  . WISDOM TOOTH EXTRACTION     Social History   Occupational History  . Not on file  Tobacco Use  . Smoking status: Former Smoker    Quit date: 05/12/2015    Years since quitting: 5.7  . Smokeless tobacco: Never Used  Vaping Use  . Vaping Use: Never used  Substance and Sexual Activity  . Alcohol use: Yes    Alcohol/week: 0.0 standard drinks    Comment: 2-3 beers per day  . Drug use: No  . Sexual activity: Yes    Birth control/protection: None

## 2021-02-16 ENCOUNTER — Other Ambulatory Visit: Payer: Self-pay

## 2021-02-16 ENCOUNTER — Ambulatory Visit (INDEPENDENT_AMBULATORY_CARE_PROVIDER_SITE_OTHER): Payer: BC Managed Care – PPO | Admitting: Physician Assistant

## 2021-02-16 ENCOUNTER — Encounter: Payer: Self-pay | Admitting: Physician Assistant

## 2021-02-16 VITALS — Temp 98.1°F | Ht 67.0 in | Wt 179.2 lb

## 2021-02-16 DIAGNOSIS — F988 Other specified behavioral and emotional disorders with onset usually occurring in childhood and adolescence: Secondary | ICD-10-CM | POA: Diagnosis not present

## 2021-02-16 DIAGNOSIS — D680 Von Willebrand's disease: Secondary | ICD-10-CM | POA: Diagnosis not present

## 2021-02-16 DIAGNOSIS — D6801 Von willebrand disease, type 1: Secondary | ICD-10-CM

## 2021-02-16 DIAGNOSIS — D508 Other iron deficiency anemias: Secondary | ICD-10-CM | POA: Diagnosis not present

## 2021-02-16 DIAGNOSIS — F419 Anxiety disorder, unspecified: Secondary | ICD-10-CM | POA: Diagnosis not present

## 2021-02-16 DIAGNOSIS — E559 Vitamin D deficiency, unspecified: Secondary | ICD-10-CM

## 2021-02-16 DIAGNOSIS — R52 Pain, unspecified: Secondary | ICD-10-CM | POA: Diagnosis not present

## 2021-02-16 DIAGNOSIS — E282 Polycystic ovarian syndrome: Secondary | ICD-10-CM

## 2021-02-16 LAB — CBC WITH DIFFERENTIAL/PLATELET
Basophils Absolute: 0.1 10*3/uL (ref 0.0–0.1)
Basophils Relative: 0.9 % (ref 0.0–3.0)
Eosinophils Absolute: 0.1 10*3/uL (ref 0.0–0.7)
Eosinophils Relative: 1.4 % (ref 0.0–5.0)
HCT: 39.4 % (ref 36.0–46.0)
Hemoglobin: 13.2 g/dL (ref 12.0–15.0)
Lymphocytes Relative: 29.7 % (ref 12.0–46.0)
Lymphs Abs: 2.1 10*3/uL (ref 0.7–4.0)
MCHC: 33.5 g/dL (ref 30.0–36.0)
MCV: 82.5 fl (ref 78.0–100.0)
Monocytes Absolute: 0.4 10*3/uL (ref 0.1–1.0)
Monocytes Relative: 5.4 % (ref 3.0–12.0)
Neutro Abs: 4.3 10*3/uL (ref 1.4–7.7)
Neutrophils Relative %: 62.6 % (ref 43.0–77.0)
Platelets: 356 10*3/uL (ref 150.0–400.0)
RBC: 4.78 Mil/uL (ref 3.87–5.11)
RDW: 22.9 % — ABNORMAL HIGH (ref 11.5–15.5)
WBC: 6.9 10*3/uL (ref 4.0–10.5)

## 2021-02-16 LAB — SEDIMENTATION RATE: Sed Rate: 12 mm/hr (ref 0–20)

## 2021-02-16 LAB — C-REACTIVE PROTEIN: CRP: <1 mg/dL (ref 0.5–20.0)

## 2021-02-16 MED ORDER — METHYLPREDNISOLONE 4 MG PO TBPK: ORAL_TABLET | ORAL | 0 refills | Status: DC

## 2021-02-16 NOTE — Patient Instructions (Signed)
Good to meet you today! Please go to the lab and we will discuss results next week. See you back next week to discuss the ADD and anxiety in more detail and create f/up plan and refills. Try the Medrol dose pak for your acute pain. Call if any problems. Please drink plenty of water and stay active.

## 2021-02-16 NOTE — Progress Notes (Signed)
Established Patient Office Visit  Subjective:  Patient ID: Kendra Patton, female    DOB: 11/16/1986  Age: 35 y.o. MRN: [id number]  CC:  Chief Complaint  Patient presents with  . Transitions Of Care  . Joint Pain    HPI Kendra Patton presents for transition of care from [facility] [place]. She has a 27 and 34 year old at home, normal vaginal deliveries. Separated. Working on trying to find work currently.   Acute concern today is bilateral muscular pain from upper thighs and up x several days. Sides of abdomen, chest, shoulders, collar bones, upper back, neck, down both arms. No falls, no injury, no new exercise. She uses an elliptical at home and also walks /runs. Hurts to touch these areas, get dressed, or even hold her purse on her shoulder. She has not taken anything for this yet.   PMH: OB/GYN Rx's Spironolactone for PCOS. Not on birth control. Periods are regular, but very heavy and clotting.   Hx of iron deficiency anemia on/off (OK during pregnancies).  She recently started back on iron supplementation mid January after labs were done.  Vitamin D deficiency-also started on a 12-week once weekly supplement in mid January this year.  ADHD-inattentiveness, Depression and anxiety, monitored and managed previously by [facility]. She has been in counseling in high school and college. No hospitalizations. No SI.  She did have some suicidal ideations a few years ago when her friend passed away from suicide and also she was going through a hard time with her husband at that time.  She has been taking Wellbutrin, Adderall, and low-dose Klonopin (prn) for these conditions. Stable currently.  Hx Von Willebrand Dz.  Past Medical History:  Diagnosis Date  . Anxiety    age 59  . Asthma   . Depression   . Eczema   . High-risk pregnancy supervision 10/27/2015  . 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 History of OCD (obsessive compulsive disorder)   . IBS (irritable bowel syndrome)   .  Scoliosis   . Von Willebrand disease ([place])     Past Surgical History:  Procedure Laterality Date  . WISDOM TOOTH EXTRACTION      Family History  Problem Relation Age of Onset  . Asthma Mother   . Hypertension Mother   . Cancer Mother        Type of Skin Cancer  . Diabetes Mother   . Depression Mother   . Heart disease Mother   . Varicose Veins Mother   . Multiple sclerosis Mother   . Cancer Father        Skin  . Deep vein thrombosis Father   . Asthma Sister   . Hypertension Sister   . Depression Sister   . Learning disabilities Sister   . Varicose Veins Sister   . Cancer Maternal Grandmother   . Cancer Paternal Grandfather        lung  . Asthma Sister   . Depression Sister   . Deafness Daughter     Social History   Socioeconomic History  . Marital status: Married    Spouse name: Not on file  . Number of children: Not on file  . Years of education: Not on file  . Highest education level: Not on file  Occupational History  . Not on file  Tobacco Use  . Smoking status: Former Smoker    Quit date: 05/12/2015    Years since quitting: 5.7  .  Smokeless tobacco: Never Used  Vaping Use  . Vaping Use: Never used  Substance and Sexual Activity  . Alcohol use: Yes    Alcohol/week: 0.0 standard drinks    Comment: 2-3 beers per day  . Drug use: No  . Sexual activity: Yes    Birth control/protection: None  Other Topics Concern  . Not on file  Social History Narrative  . Not on file   Social Determinants of Health   Financial Resource Strain: Not on file  Food Insecurity: Not on file  Transportation Needs: Not on file  Physical Activity: Not on file  Stress: Not on file  Social Connections: Not on file  Intimate Partner Violence: Not on file    Outpatient Medications Prior to Visit  Medication Sig Dispense Refill  . albuterol (VENTOLIN HFA) 108 (90 Base) MCG/ACT inhaler INHALE 1 PUFF INTO THE LUNGS EVERY 6 (SIX) HOURS AS NEEDED FOR WHEEZING OR SHORTNESS OF  BREATH. 6.7 g 5  . amphetamine-dextroamphetamine (ADDERALL XR) 30 MG 24 hr capsule Take 1 capsule (30 mg total) by mouth every morning. 30 capsule 0  . buPROPion (WELLBUTRIN XL) 300 MG 24 hr tablet TAKE 1 TABLET BY MOUTH EVERY DAY 90 tablet 1  . clonazePAM (KLONOPIN) 0.5 MG tablet Take 0.5 tablets (0.25 mg total) by mouth 2 (two) times daily as needed for anxiety. 30 tablet 0  . ferrous sulfate 325 (65 FE) MG EC tablet Take 1 tablet (325 mg total) by mouth daily with breakfast. 90 tablet 3  . Vitamin D, Ergocalciferol, (DRISDOL) 1.25 MG (50000 UNIT) CAPS capsule Take once capsule by mouth once a week for 12 weeks 5 capsule 3  . spironolactone (ALDACTONE) 25 MG tablet Take 50 mg by mouth daily. (Patient not taking: Reported on 02/16/2021)    . cefdinir (OMNICEF) 300 MG capsule Take 1 capsule (300 mg total) by mouth 2 (two) times daily. 14 capsule 0  . vitamin C (ASCORBIC ACID) 500 MG tablet Take 1 tablet by mouth daily.     No facility-administered medications prior to visit.    Allergies  Allergen Reactions  . Aspirin Other (See Comments)    "Thins bloods too much"  . Compazine [Prochlorperazine Edisylate]     Dystonic.  Marland Kitchen Other     Compazine    ROS Review of Systems  Constitutional: Positive for fatigue. Negative for activity change, appetite change, fever and unexpected weight change.  HENT: Negative for congestion.   Eyes: Negative for visual disturbance.  Respiratory: Negative for apnea, cough and shortness of breath.   Cardiovascular: Negative for chest pain, palpitations and leg swelling.  Gastrointestinal: Negative for abdominal pain, blood in stool, constipation and diarrhea.  Endocrine: Negative for polydipsia, polyphagia and polyuria.  Genitourinary: Negative for dysuria and pelvic pain.  Musculoskeletal: Positive for back pain, myalgias and neck pain. Negative for arthralgias and joint swelling.  Skin: Negative for rash.  Neurological: Negative for dizziness, weakness and  headaches.  Hematological: Negative for adenopathy. Does not bruise/bleed easily.  Psychiatric/Behavioral: Positive for decreased concentration. Negative for sleep disturbance and suicidal ideas. The patient is nervous/anxious.       Objective:    Physical Exam Vitals and nursing note reviewed.  Constitutional:      Appearance: Normal appearance.  HENT:     Head: Normocephalic and atraumatic.     Right Ear: External ear normal.     Left Ear: External ear normal.     Nose: Nose normal.     Mouth/Throat:  Mouth: Mucous membranes are moist.  Eyes:     Extraocular Movements: Extraocular movements intact.     Conjunctiva/sclera: Conjunctivae normal.     Pupils: Pupils are equal, round, and reactive to light.  Cardiovascular:     Rate and Rhythm: Normal rate and regular rhythm.     Pulses: Normal pulses.     Heart sounds: Normal heart sounds.  Pulmonary:     Effort: Pulmonary effort is normal.     Breath sounds: Normal breath sounds.  Abdominal:     General: Abdomen is flat. Bowel sounds are normal.     Palpations: Abdomen is soft.  Musculoskeletal:        General: Normal range of motion.     Comments: There are several points of tenderness bilaterally including the muscles of her shoulders, her clavicles, upper arms, lateral chest, and bilateral upper thighs.  There are no rashes.  Neurovascular is intact.  She has good strength in her upper and lower extremities.  Skin:    General: Skin is warm.  Neurological:     General: No focal deficit present.     Mental Status: She is alert and oriented to person, place, and time.  Psychiatric:        Mood and Affect: Mood normal.        Behavior: Behavior normal.        Thought Content: Thought content normal.     Temp 98.1 F (36.7 C)   Ht 5\' 7"  (1.702 m)   Wt 179 lb 3.2 oz (81.3 kg)   LMP 02/08/2021   BMI 28.07 kg/m  Wt Readings from Last 3 Encounters:  02/16/21 179 lb 3.2 oz (81.3 kg)  12/29/20 179 lb (81.2 kg)   12/22/20 178 lb (80.7 kg)     Health Maintenance Due  Topic Date Due  . Hepatitis C Screening  Never done    There are no preventive care reminders to display for this patient.  Lab Results  Component Value Date   TSH 2.38 01/01/2021   Lab Results  Component Value Date   WBC 6.2 01/01/2021   HGB 11.4 (L) 01/01/2021   HCT 35.7 (L) 01/01/2021   MCV 75.0 (L) 01/01/2021   PLT 411.0 (H) 01/01/2021   Lab Results  Component Value Date   NA 139 12/22/2020   K 4.2 12/22/2020   CO2 24 12/22/2020   GLUCOSE 98 12/22/2020   BUN 9 12/22/2020   CREATININE 0.59 12/22/2020   BILITOT 0.4 01/01/2021   ALKPHOS 59 01/01/2021   AST 28 01/01/2021   ALT 20 01/01/2021   PROT 7.5 01/01/2021   ALBUMIN 4.4 01/01/2021   CALCIUM 9.8 12/22/2020   ANIONGAP 11 12/22/2020    Assessment & Plan:   Problem List Items Addressed This Visit      Endocrine   Polycystic ovary syndrome     Hematopoietic and Hemostatic   Von Willebrand disease, type I ([place])   Relevant Orders   CBC with Differential/Platelet     Other   Anxiety - dx'd at age 18   Attention deficit disorder (ADD) without hyperactivity    Other Visit Diagnoses    Generalized pain    -  Primary   Relevant Orders   CBC with Differential/Platelet   Lyme Ab/Western Blot Reflex   ANA   C-reactive protein   Sedimentation rate   Other iron deficiency anemia       Relevant Orders   CBC with Differential/Platelet   Vitamin D  deficiency          Meds ordered this encounter  Medications  . methylPREDNISolone (MEDROL DOSEPAK) 4 MG TBPK tablet    Sig: Please take per packaging instructions.    Dispense:  21 tablet    Refill:  0    Follow-up: Return in about 9 days (around 02/25/2021) for ADD and labs discussion.   1. Generalized pain Her pain distribution today reminds me of fibromyalgia or possibly an autoimmune type condition. Labs are certainly reasonable to check and we can discuss results next week. She has done okay with  steroids in the past and a Medrol Dosepak may be most helpful for her in this situation right now. Side effects discussed with her. She will continue to stay active and push fluids. She will call me sooner if anything worsens or changes.  2. Attention deficit disorder (ADD) without hyperactivity She is taking Adderall XR 30 mg daily. We are going to discuss at next visit in further detail and start prescribing once contract is signed.  3. Anxiety - dx'd at age 54 She is taking Wellbutrin daily as well as Klonopin as needed. Again we will discuss in further detail at next appointment and start the plan for prescribing this.  4. Polycystic ovary syndrome She will continue regular follow-up with her OB/GYN.  5. Von Willebrand disease, type I ([place]) Noted history.  6. Other iron deficiency anemia She will continue iron supplementation and we can recheck iron panel in the next 1 to 2 months.  7. Vitamin D deficiency She will continue taking the once weekly 50,000 unit vitamin D and then we will recheck her level once this is completed.  Total time spent with patient today obtaining history, performing physical exam, reviewing medications and prior labs, and discussing plan above was 68 minutes.  This visit occurred during the SARS-CoV-2 public health emergency.  Safety protocols were in place, including screening questions prior to the visit, additional usage of staff PPE, and extensive cleaning of exam room while observing appropriate contact time as indicated for disinfecting solutions.    M , PA-C

## 2021-02-17 LAB — LYME AB/WESTERN BLOT REFLEX
LYME DISEASE AB, QUANT, IGM: <0.8 index (ref 0.00–0.79)
Lyme IgG/IgM Ab: <0.91 {ISR} (ref 0.00–0.90)

## 2021-02-18 LAB — ANA: Anti Nuclear Antibody (ANA): NEGATIVE

## 2021-02-23 ENCOUNTER — Encounter: Payer: Self-pay | Admitting: *Deleted

## 2021-02-25 ENCOUNTER — Other Ambulatory Visit (HOSPITAL_COMMUNITY)
Admission: RE | Admit: 2021-02-25 | Discharge: 2021-02-25 | Disposition: A | Discharge status: Home / Self Care | Payer: BC Managed Care – PPO | Source: Ambulatory Visit | Attending: Physician Assistant | Admitting: Physician Assistant

## 2021-02-25 ENCOUNTER — Ambulatory Visit (INDEPENDENT_AMBULATORY_CARE_PROVIDER_SITE_OTHER): Payer: BC Managed Care – PPO | Admitting: Physician Assistant

## 2021-02-25 ENCOUNTER — Other Ambulatory Visit: Payer: Self-pay

## 2021-02-25 ENCOUNTER — Encounter: Payer: Self-pay | Admitting: Physician Assistant

## 2021-02-25 VITALS — BP 143/87 | HR 101 | Temp 98.2°F | Ht 67.0 in | Wt 177.0 lb

## 2021-02-25 DIAGNOSIS — N760 Acute vaginitis: Secondary | ICD-10-CM | POA: Diagnosis not present

## 2021-02-25 DIAGNOSIS — F419 Anxiety disorder, unspecified: Secondary | ICD-10-CM | POA: Diagnosis not present

## 2021-02-25 DIAGNOSIS — F988 Other specified behavioral and emotional disorders with onset usually occurring in childhood and adolescence: Secondary | ICD-10-CM | POA: Diagnosis not present

## 2021-02-25 DIAGNOSIS — F320 Major depressive disorder, single episode, mild: Secondary | ICD-10-CM

## 2021-02-25 MED ORDER — AMPHETAMINE-DEXTROAMPHET ER 30 MG PO CP24: 30.0000 mg | ORAL_CAPSULE | ORAL | 0 refills | Status: DC

## 2021-02-25 NOTE — Patient Instructions (Signed)
Someone will call you about referral to psychiatry. Please take all medications as directed. Thank you for signing the contract today.  Other results will be placed in MyChart.  Please call sooner if any concerns.     Living With Attention Deficit Hyperactivity Disorder If you have been diagnosed with attention deficit hyperactivity disorder (ADHD), you may be relieved that you now know why you have felt or behaved a certain way. Still, you may feel overwhelmed about the treatment ahead. You may also wonder how to get the support you need and how to deal with the condition day-to-day. With treatment and support, you can live with ADHD and manage your symptoms. How to manage lifestyle changes Managing stress Stress is your body's reaction to life changes and events, both good and bad. To cope with the stress of an ADHD diagnosis, it may help to:  Learn more about ADHD.  Exercise regularly. Even a short daily walk can lower stress levels.  Participate in training or education programs (including social skills training classes) that teach you to deal with symptoms.   Medicines Your health care provider may suggest certain medicines if he or she feels that they will help to improve your condition. Stimulant medicines are usually prescribed to treat ADHD, and therapy may also be prescribed. It is important to:  Avoid using alcohol and other substances that may prevent your medicines from working properly (may interact).  Talk with your pharmacist or health care provider about all the medicines that you take, their possible side effects, and what medicines are safe to take together.  Make it your goal to take part in all treatment decisions (shared decision-making). Ask about possible side effects of medicines that your health care provider recommends, and tell him or her how you feel about having those side effects. It is best if shared decision-making with your health care provider is part of  your total treatment plan. Relationships To strengthen your relationships with family members while treating your condition, consider taking part in family therapy. You might also attend self-help groups alone or with a loved one. Be honest about how your symptoms affect your relationships. Make an effort to communicate respectfully instead of fighting, and find ways to show others that you care. Psychotherapy may be useful in helping you cope with how ADHD affects your relationships. How to recognize changes in your condition The following signs may mean that your treatment is working well and your condition is improving:  Consistently being on time for appointments.  Being more organized at home and work.  Other people noticing improvements in your behavior.  Achieving goals that you set for yourself.  Thinking more clearly. The following signs may mean that your treatment is not working very well:  Feeling impatience or more confusion.  Missing, forgetting, or being late for appointments.  An increasing sense of disorganization and messiness.  More difficulty in reaching goals that you set for yourself.  Loved ones becoming angry or frustrated with you. Follow these instructions at home:  Take over-the-counter and prescription medicines only as told by your health care provider. Check with your health care provider before taking any new medicines.  Create structure and an organized atmosphere at home. For example: ? Make a list of tasks, then rank them from most important to least important. Work on one task at a time until your listed tasks are done. ? Make a daily schedule and follow it consistently every day. ? Use an appointment calendar,  and check it 2 or 3 times a day to keep on track. Keep it with you when you leave the house. ? Create spaces where you keep certain things, and always put things back in their places after you use them.  Keep all follow-up visits as told  by your health care provider. This is important. Where to find support Talking to others  Keep emotion out of important discussions and speak in a calm, logical way.  Listen closely and patiently to your loved ones. Try to understand their point of view, and try to avoid getting defensive.  Take responsibility for the consequences of your actions.  Ask that others do not take your behaviors personally.  Aim to solve problems as they come up, and express your feelings instead of bottling them up.  Talk openly about what you need from your loved ones and how they can support you.  Consider going to family therapy sessions or having your family meet with a specialist who deals with ADHD-related behavior problems.   Finances Not all insurance plans cover mental health care, so it is important to check with your insurance carrier. If paying for co-pays or counseling services is a problem, search for a local or county mental health care center. Public mental health care services may be offered there at a low cost or no cost when you are not able to see a private health care provider. If you are taking medicine for ADHD, you may be able to get the generic form, which may be less expensive than brand-name medicine. Some makers of prescription medicines also offer help to patients who cannot afford the medicines that they need. Questions to ask your health care provider:  What are the risks and benefits of taking medicines?  Would I benefit from therapy?  How often should I follow up with a health care provider? Contact a health care provider if:  You have side effects from your medicines, such as: ? Repeated muscle twitches, coughing, or speech outbursts. ? Sleep problems. ? Loss of appetite. ? Depression. ? New or worsening behavior problems. ? Dizziness. ? Unusually fast heartbeat. ? Stomach pains. ? Headaches. Get help right away if:  You have a severe reaction to a  medicine.  Your behavior suddenly gets worse. Summary  With treatment and support, you can live with ADHD and manage your symptoms.  The medicines that are most often prescribed for ADHD are stimulants.  Consider taking part in family therapy or self-help groups with family members or friends.  When you talk with friends and family about your ADHD, be patient and communicate openly.  Take over-the-counter and prescription medicines only as told by your health care provider. Check with your health care provider before taking any new medicines. This information is not intended to replace advice given to you by your health care provider. Make sure you discuss any questions you have with your health care provider. Document Revised: 05/21/2020 Document Reviewed: 05/21/2020 Elsevier Patient Education  2021 [employer].

## 2021-02-25 NOTE — Progress Notes (Signed)
Established Patient Office Visit  Subjective:  Patient ID: Kendra Patton, female    DOB: September 17, 1986  Age: 35 y.o. MRN: [id number]  CC:  Chief Complaint  Patient presents with  . Follow-up  . Vaginal Discharge    Odor x 2 weeks    HPI Kendra Patton presents for medication management.   ADHD - diagnosed in ?5th grade. She states her main issue is staying focused. She denies issues with hyperactivity. She feels like her mind is "all over the place" most of the time and stays frustrated because she has trouble completing tasks. Forgetful a lot. Started back on Adderall XR 30 mg in the last year and states it helps, but doesn't feel like it helps as much as it did in her past (did very well in grade school and college). Ritalin and Concerta were previously tried and did not help.  Depression, Anxiety, OCD - She has tried Effexor XR, but did not find it very effective and she was having more bruising issues. She has been taking Wellbutrin XL 300 mg since January 2022 and finds it mostly effective. Panic disorder, happens sometimes with driving, or even checking the mail. She takes Klonopin 0.25 mg BID prn, states she always takes this at night to reduce anxiety prior to bed. (Previously was on Ambien, but had sleep-walking issues; and has tried another one but cannot remember the name). Also describes one week where she barely slept even 5 hours total. There is a history of Bipolar with her mother.   She has previously seen psychologist and psychiatrist for depression, anxiety, OCD, and ADHD. Most recently saw Dr. Gaynell Face in April 2020 for grief reaction counseling, not medication management.   She is also concerned about a vaginal discharge with odor that has been going on a few weeks and would like to be tested for STIs and BV / yeast.   Past Medical History:  Diagnosis Date  . Anxiety    age 35  . Asthma   . Depression   . Eczema   . High-risk pregnancy supervision 10/27/2015  .  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 History of OCD (obsessive compulsive disorder)   . IBS (irritable bowel syndrome)   . Scoliosis   . Von Willebrand disease ([place])     Past Surgical History:  Procedure Laterality Date  . WISDOM TOOTH EXTRACTION      Family History  Problem Relation Age of Onset  . Asthma Mother   . Hypertension Mother   . Cancer Mother        Type of Skin Cancer  . Diabetes Mother   . Depression Mother   . Heart disease Mother   . Varicose Veins Mother   . Multiple sclerosis Mother   . Cancer Father        Skin  . Deep vein thrombosis Father   . Asthma Sister   . Hypertension Sister   . Depression Sister   . Learning disabilities Sister   . Varicose Veins Sister   . Cancer Maternal Grandmother   . Cancer Paternal Grandfather        lung  . Asthma Sister   . Depression Sister   . Deafness Daughter     Social History   Socioeconomic History  . Marital status: Married    Spouse name: Not on file  . Number of children: Not on file  . Years of education: Not on file  . Highest education  level: Not on file  Occupational History  . Not on file  Tobacco Use  . Smoking status: Former Smoker    Quit date: 05/12/2015    Years since quitting: 5.7  . Smokeless tobacco: Never Used  Vaping Use  . Vaping Use: Never used  Substance and Sexual Activity  . Alcohol use: Yes    Alcohol/week: 0.0 standard drinks    Comment: 2-3 beers per day  . Drug use: No  . Sexual activity: Yes    Birth control/protection: None  Other Topics Concern  . Not on file  Social History Narrative  . Not on file   Social Determinants of Health   Financial Resource Strain: Not on file  Food Insecurity: Not on file  Transportation Needs: Not on file  Physical Activity: Not on file  Stress: Not on file  Social Connections: Not on file  Intimate Partner Violence: Not on file    Outpatient Medications Prior to Visit  Medication Sig Dispense Refill  . albuterol  (VENTOLIN HFA) 108 (90 Base) MCG/ACT inhaler INHALE 1 PUFF INTO THE LUNGS EVERY 6 (SIX) HOURS AS NEEDED FOR WHEEZING OR SHORTNESS OF BREATH. 6.7 g 5  . buPROPion (WELLBUTRIN XL) 300 MG 24 hr tablet TAKE 1 TABLET BY MOUTH EVERY DAY 90 tablet 1  . clonazePAM (KLONOPIN) 0.5 MG tablet Take 0.5 tablets (0.25 mg total) by mouth 2 (two) times daily as needed for anxiety. 30 tablet 0  . ferrous sulfate 325 (65 FE) MG EC tablet Take 1 tablet (325 mg total) by mouth daily with breakfast. 90 tablet 3  . spironolactone (ALDACTONE) 25 MG tablet Take 50 mg by mouth daily.    . Vitamin D, Ergocalciferol, (DRISDOL) 1.25 MG (50000 UNIT) CAPS capsule Take once capsule by mouth once a week for 12 weeks 5 capsule 3  . amphetamine-dextroamphetamine (ADDERALL XR) 30 MG 24 hr capsule Take 1 capsule (30 mg total) by mouth every morning. 30 capsule 0  . methylPREDNISolone (MEDROL DOSEPAK) 4 MG TBPK tablet Please take per packaging instructions. 21 tablet 0   No facility-administered medications prior to visit.    Allergies  Allergen Reactions  . Aspirin Other (See Comments)    "Thins bloods too much"  . Compazine [Prochlorperazine Edisylate]     Dystonic.  Marland Kitchen Other     Compazine    ROS Review of Systems REFER TO HPI FOR PERTINENT POSITIVES AND NEGATIVES    Objective:    Physical Exam  TALK WITH PATIENT ONLY TODAY. SHE IS FAIRLY RELAXED AND COMFORTABLE TALKING OPENLY WITH ME ABOUT HER PAST HISTORY.   SHE WAS ABLE TO DO A SELF-SWAB  FOR VAGINITIS TODAY. BP (!) 143/87   Pulse (!) 101   Temp 98.2 F (36.8 C)   Ht 5\' 7"  (1.702 m)   Wt 177 lb (80.3 kg)   LMP 02/08/2021   SpO2 98%   BMI 27.72 kg/m  Wt Readings from Last 3 Encounters:  02/25/21 177 lb (80.3 kg)  02/16/21 179 lb 3.2 oz (81.3 kg)  12/29/20 179 lb (81.2 kg)     Health Maintenance Due  Topic Date Due  . Hepatitis C Screening  Never done    There are no preventive care reminders to display for this patient.  Lab Results   Component Value Date   TSH 2.38 01/01/2021   Lab Results  Component Value Date   WBC 6.9 02/16/2021   HGB 13.2 02/16/2021   HCT 39.4 02/16/2021   MCV 82.5 02/16/2021  PLT 356.0 02/16/2021   Lab Results  Component Value Date   NA 139 12/22/2020   K 4.2 12/22/2020   CO2 24 12/22/2020   GLUCOSE 98 12/22/2020   BUN 9 12/22/2020   CREATININE 0.59 12/22/2020   BILITOT 0.4 01/01/2021   ALKPHOS 59 01/01/2021   AST 28 01/01/2021   ALT 20 01/01/2021   PROT 7.5 01/01/2021   ALBUMIN 4.4 01/01/2021   CALCIUM 9.8 12/22/2020   ANIONGAP 11 12/22/2020   Assessment & Plan:   Problem List Items Addressed This Visit      Other   Anxiety - dx'd at age 67   Relevant Orders   Ambulatory referral to Psychiatry   Attention deficit disorder (ADD) without hyperactivity - Primary   Relevant Medications   amphetamine-dextroamphetamine (ADDERALL XR) 30 MG 24 hr capsule   Other Relevant Orders   Ambulatory referral to Psychiatry    Other Visit Diagnoses    Depression, major, single episode, mild ([place])       Relevant Orders   Ambulatory referral to Psychiatry   Acute vaginitis       Relevant Orders   Cervicovaginal ancillary only( [place])      Meds ordered this encounter  Medications  . amphetamine-dextroamphetamine (ADDERALL XR) 30 MG 24 hr capsule    Sig: Take 1 capsule (30 mg total) by mouth every morning.    Dispense:  30 capsule    Refill:  0    Follow-up: Return in about 3 months (around 05/28/2021) for med check.   1. Attention deficit disorder (ADD) without hyperactivity 2. Anxiety - dx'd at age 5 3. Depression, major, single episode, mild ([place]) Reviewed history in detail with patient today. Also reviewed PDMP with no discrepancies or red flags found. Contract signed for the Adderall XR 30 mg qd and Klonopin 0.25 mg BID. Informed that I would refill these medications until she can get in to see psych for reassessment / medication management. No SI or HI. She will call  sooner if any concerns.   4. Acute vaginitis Self-swab in office today. Will treat pending results. Recommend not douching and increasing yogurt in diet.   Total time spent today in office with patient reviewing history and medications, as well as discussing plan and documenting was 43 minutes.     M , PA-C

## 2021-02-26 ENCOUNTER — Encounter: Payer: Self-pay | Admitting: Physician Assistant

## 2021-02-26 ENCOUNTER — Other Ambulatory Visit: Payer: Self-pay

## 2021-02-26 LAB — CERVICOVAGINAL ANCILLARY ONLY
Bacterial Vaginitis (gardnerella): POSITIVE — AB
Candida Glabrata: NEGATIVE
Candida Vaginitis: NEGATIVE
Chlamydia: NEGATIVE
Comment: NEGATIVE
Comment: NEGATIVE
Comment: NEGATIVE
Comment: NEGATIVE
Comment: NEGATIVE
Comment: NORMAL
Neisseria Gonorrhea: NEGATIVE
Trichomonas: NEGATIVE

## 2021-02-26 MED ORDER — METRONIDAZOLE 500 MG PO TABS: 500.0000 mg | ORAL_TABLET | Freq: Two times a day (BID) | ORAL | 0 refills | Status: AC

## 2021-02-26 NOTE — Progress Notes (Signed)
Rx sent in

## 2021-02-27 ENCOUNTER — Other Ambulatory Visit: Payer: Self-pay | Admitting: Physician Assistant

## 2021-02-27 MED ORDER — METRONIDAZOLE 0.75 % EX GEL: CUTANEOUS | 1 refills | Status: DC

## 2021-03-17 ENCOUNTER — Other Ambulatory Visit: Payer: Self-pay | Admitting: Family Medicine

## 2021-03-17 ENCOUNTER — Other Ambulatory Visit: Payer: Self-pay

## 2021-03-17 NOTE — Telephone Encounter (Signed)
Rx Request 

## 2021-03-18 ENCOUNTER — Telehealth: Payer: Self-pay | Admitting: Orthopaedic Surgery

## 2021-03-18 ENCOUNTER — Other Ambulatory Visit: Payer: Self-pay | Admitting: Physician Assistant

## 2021-03-18 MED ORDER — CLONAZEPAM 0.5 MG PO TABS: 0.2500 mg | ORAL_TABLET | Freq: Two times a day (BID) | ORAL | 0 refills | Status: DC | PRN

## 2021-03-18 NOTE — Telephone Encounter (Signed)
Called pt 1x to set appt for MRI review after 03/24/21. Was unable to leave vm due to no vm set. Will try again later

## 2021-03-19 ENCOUNTER — Telehealth: Payer: Self-pay | Admitting: Orthopaedic Surgery

## 2021-03-19 NOTE — Telephone Encounter (Signed)
Called pt 2x and phone just rang and no vm set up. Pt need to set appt for MRI review with Dr. Erlinda Hong after 4/5.

## 2021-03-20 ENCOUNTER — Telehealth: Payer: Self-pay | Admitting: Orthopaedic Surgery

## 2021-03-20 NOTE — Telephone Encounter (Signed)
Called pt 3x for appt for MRI review and no answer. Pt will be cleared off task board

## 2021-03-23 ENCOUNTER — Telehealth: Payer: Self-pay

## 2021-03-23 NOTE — Telephone Encounter (Signed)
Noted, thank you for the FYI. Thankfully I have not seen this happen with Zofran and according to Up To Date <1% report this reaction. Please f/up if any problems do arise.

## 2021-03-23 NOTE — Telephone Encounter (Signed)
Please advise 

## 2021-03-23 NOTE — Telephone Encounter (Signed)
Pt called stating she has been sick all weekend with a stomach bug. Pt [facility] and [facility] gave her some zofran for nausea. Pt states she did not take any zofran yesterday because she felt better but when she woke up today she was feeling nauseous. Pt states she took 2 zofran this morning to help with nausea. Pt states she read on the bottle that a dystonic reaction is a side effect from the medication. Pt states she had a dystonic reaction from anti-nausea medication when she was a teenager and is worried she may experience one again. Pt wanted to make Alyssa aware.

## 2021-03-23 NOTE — Telephone Encounter (Signed)
Patient stated feeling ok,informed to call if any issues arise.

## 2021-03-24 ENCOUNTER — Ambulatory Visit
Admission: RE | Admit: 2021-03-24 | Discharge: 2021-03-24 | Disposition: A | Discharge status: Home / Self Care | Payer: BC Managed Care – PPO | Source: Ambulatory Visit | Attending: Orthopaedic Surgery | Admitting: Orthopaedic Surgery

## 2021-03-24 ENCOUNTER — Other Ambulatory Visit: Payer: Self-pay

## 2021-03-24 DIAGNOSIS — M25551 Pain in right hip: Secondary | ICD-10-CM

## 2021-03-24 MED ORDER — IOPAMIDOL (ISOVUE-M 200) INJECTION 41%
15.0000 mL | Freq: Once | INTRAMUSCULAR | Status: AC
  Administered 2021-03-24: 15 mL via INTRA_ARTICULAR

## 2021-03-25 NOTE — Progress Notes (Signed)
Needs appt.  Thanks.

## 2021-03-27 ENCOUNTER — Ambulatory Visit (INDEPENDENT_AMBULATORY_CARE_PROVIDER_SITE_OTHER): Payer: BC Managed Care – PPO | Admitting: Orthopaedic Surgery

## 2021-03-27 ENCOUNTER — Telehealth: Payer: Self-pay

## 2021-03-27 ENCOUNTER — Encounter: Payer: Self-pay | Admitting: Orthopaedic Surgery

## 2021-03-27 VITALS — Ht 67.0 in | Wt 177.0 lb

## 2021-03-27 DIAGNOSIS — M25551 Pain in right hip: Secondary | ICD-10-CM | POA: Diagnosis not present

## 2021-03-27 DIAGNOSIS — M545 Low back pain, unspecified: Secondary | ICD-10-CM

## 2021-03-27 NOTE — Addendum Note (Signed)
Addended by: Precious Bard on: 03/27/2021 01:18 PM   Modules accepted: Orders

## 2021-03-27 NOTE — Progress Notes (Signed)
Office Visit Note   Patient: Kendra Patton           Date of Birth: 06-21-1986           MRN: [id number] Visit Date: 03/27/2021              Requested by: Allwardt, Randa Evens, PA-C [place],  [place] [address] PCP: Fredirick Lathe, PA-C   Assessment & Plan: Visit Diagnoses:  1. Pain in right hip     Plan: Impression is chronic right hip and low back pain.  MRI of the right hip is relatively unremarkable other than some mild tendinosis of the piriformis and abductors.  No intra-articular pathology.  Given the lack of findings I recommend an MRI of the lumbar spine to further assess for cause of her symptoms.  We will see her back after the MRI.  Follow-Up Instructions: Return if symptoms worsen or fail to improve.   Orders:  No orders of the defined types were placed in this encounter.  No orders of the defined types were placed in this encounter.     Procedures: No procedures performed   Clinical Data: No additional findings.   Subjective: Chief Complaint  Patient presents with  . Right Hip - Follow-up    MRI review    Patient returns today for follow-up of a recent MRI scan of the right hip.  She denies any changes in her symptoms.  Continues to have pain in the right side of the lower back that radiates into the groin.   Review of Systems  Constitutional: Negative.   HENT: Negative.   Eyes: Negative.   Respiratory: Negative.   Cardiovascular: Negative.   Endocrine: Negative.   Musculoskeletal: Negative.   Neurological: Negative.   Hematological: Negative.   Psychiatric/Behavioral: Negative.   All other systems reviewed and are negative.    Objective: Vital Signs: Ht 5\' 7"  (1.702 m)   Wt 177 lb (80.3 kg)   LMP 03/19/2021 (Exact Date)   BMI 27.72 kg/m   Physical Exam Vitals and nursing note reviewed.  Constitutional:      Appearance: She is well-developed.  Pulmonary:     Effort: Pulmonary effort is normal.  Skin:     General: Skin is warm.     Capillary Refill: Capillary refill takes less than 2 seconds.  Neurological:     Mental Status: She is alert and oriented to person, place, and time.  Psychiatric:        Behavior: Behavior normal.        Thought Content: Thought content normal.        Judgment: Judgment normal.     Ortho Exam Right hip shows good range of motion without significant pain in the groin.  She has some mild pain in the posterior lateral region of the hip.  Negative sciatic tension signs.  Lumbar spine is moderately tender. Specialty Comments:  No specialty comments available.  Imaging: No results found.   PMFS History: Patient Active Problem List   Diagnosis Date Noted  . 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 Polycystic ovary syndrome 12/24/2019  . Vaginal delivery 02/29/2016  . Anxiety - dx'd at age 11 02/28/2016  . Eczema 02/28/2016  . IBS (irritable bowel syndrome) 02/28/2016  . Scoliosis 02/28/2016  . Seasonal allergies 02/28/2016  . Allergy history, drug -- Compazine (dystonic rxn; seizures) 02/27/2016  . High-risk pregnancy supervision 10/27/2015  . Von Willebrand disease, type I ([place]) 07/11/2015  .  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 Asthma 07/11/2015   Past Medical History:  Diagnosis Date  . Anxiety    age 72  . Asthma   . Depression   . Eczema   . High-risk pregnancy supervision 10/27/2015  . 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 History of OCD (obsessive compulsive disorder)   . IBS (irritable bowel syndrome)   . Scoliosis   . Von Willebrand disease ([place])     Family History  Problem Relation Age of Onset  . Asthma Mother   . Hypertension Mother   . Cancer Mother        Type of Skin Cancer  . Diabetes Mother   . Depression Mother   . Heart disease Mother   . Varicose Veins Mother   . Multiple sclerosis Mother   . Cancer Father        Skin  . Deep vein thrombosis Father   . Asthma Sister   . Hypertension Sister   .  Depression Sister   . Learning disabilities Sister   . Varicose Veins Sister   . Cancer Maternal Grandmother   . Cancer Paternal Grandfather        lung  . Asthma Sister   . Depression Sister   . Deafness Daughter     Past Surgical History:  Procedure Laterality Date  . WISDOM TOOTH EXTRACTION     Social History   Occupational History  . Not on file  Tobacco Use  . Smoking status: Former Smoker    Quit date: 05/12/2015    Years since quitting: 5.8  . Smokeless tobacco: Never Used  Vaping Use  . Vaping Use: Never used  Substance and Sexual Activity  . Alcohol use: Yes    Alcohol/week: 0.0 standard drinks    Comment: 2-3 beers per day  . Drug use: No  . Sexual activity: Yes    Birth control/protection: None

## 2021-03-27 NOTE — Telephone Encounter (Signed)
.   LAST APPOINTMENT DATE: 03/23/2021   NEXT APPOINTMENT DATE:@Visit  date not found  MEDICATION:amphetamine-dextroamphetamine (ADDERALL XR) 30 MG 24 hr capsule   PHARMACY:[id number] - [place], [place] - [address]

## 2021-03-30 ENCOUNTER — Other Ambulatory Visit: Payer: Self-pay | Admitting: Physician Assistant

## 2021-03-30 DIAGNOSIS — F988 Other specified behavioral and emotional disorders with onset usually occurring in childhood and adolescence: Secondary | ICD-10-CM

## 2021-03-30 MED ORDER — AMPHETAMINE-DEXTROAMPHET ER 30 MG PO CP24: 30.0000 mg | ORAL_CAPSULE | ORAL | 0 refills | Status: DC

## 2021-03-30 NOTE — Telephone Encounter (Signed)
Rx Request 

## 2021-04-12 ENCOUNTER — Encounter: Payer: Self-pay | Admitting: Physician Assistant

## 2021-04-13 NOTE — Telephone Encounter (Signed)
See note

## 2021-04-15 ENCOUNTER — Other Ambulatory Visit: Payer: Self-pay

## 2021-04-15 ENCOUNTER — Ambulatory Visit
Admission: RE | Admit: 2021-04-15 | Discharge: 2021-04-15 | Disposition: A | Discharge status: Home / Self Care | Payer: BC Managed Care – PPO | Source: Ambulatory Visit | Attending: Orthopaedic Surgery | Admitting: Orthopaedic Surgery

## 2021-04-15 DIAGNOSIS — M545 Low back pain, unspecified: Secondary | ICD-10-CM

## 2021-04-19 DIAGNOSIS — U071 COVID-19: Secondary | ICD-10-CM

## 2021-04-19 HISTORY — DX: COVID-19: U07.1

## 2021-04-22 ENCOUNTER — Ambulatory Visit: Payer: BC Managed Care – PPO | Admitting: Orthopaedic Surgery

## 2021-04-28 ENCOUNTER — Encounter: Payer: Self-pay | Admitting: Physician Assistant

## 2021-04-28 ENCOUNTER — Telehealth (INDEPENDENT_AMBULATORY_CARE_PROVIDER_SITE_OTHER): Payer: BC Managed Care – PPO | Admitting: Physician Assistant

## 2021-04-28 DIAGNOSIS — B9689 Other specified bacterial agents as the cause of diseases classified elsewhere: Secondary | ICD-10-CM | POA: Diagnosis not present

## 2021-04-28 DIAGNOSIS — U071 COVID-19: Secondary | ICD-10-CM

## 2021-04-28 DIAGNOSIS — N760 Acute vaginitis: Secondary | ICD-10-CM

## 2021-04-28 DIAGNOSIS — F988 Other specified behavioral and emotional disorders with onset usually occurring in childhood and adolescence: Secondary | ICD-10-CM

## 2021-04-28 MED ORDER — AMPHETAMINE-DEXTROAMPHET ER 30 MG PO CP24: 30.0000 mg | ORAL_CAPSULE | ORAL | 0 refills | Status: DC

## 2021-04-28 MED ORDER — PREDNISONE 20 MG PO TABS: 40.0000 mg | ORAL_TABLET | Freq: Every day | ORAL | 0 refills | Status: DC

## 2021-04-28 MED ORDER — METRONIDAZOLE 0.75 % EX GEL: CUTANEOUS | 1 refills | Status: DC

## 2021-04-28 MED ORDER — NIRMATRELVIR/RITONAVIR (PAXLOVID)TABLET: 3.0000 | ORAL_TABLET | Freq: Two times a day (BID) | ORAL | 0 refills | Status: AC

## 2021-04-28 NOTE — Progress Notes (Signed)
Virtual Visit via Video   I connected with Kendra Patton on 04/28/21 at  3:30 PM EDT by a video enabled telemedicine application and verified that I am speaking with the correct person using two identifiers. Location patient: Home Location provider:  [facility], Office Persons participating in the virtual visit: Kendra Clayton Bibles PA-C  I discussed the limitations of evaluation and management by telemedicine and the availability of in person appointments. The patient expressed understanding and agreed to proceed.  Subjective:   HPI:   COVID-19 Symptom onset: 2 days Travel/contacts: all family members are positive Vaccination status: none Testing results: home test today is postive  Patient endorses the following symptoms: Cough, nasal congestion, HA, fever, fatigue, chest tightness, SOB  Patient denies the following symptoms: Chest pain, lower leg swelling, vomiting, inability to take PO's  Hx of asthma. Using albuterol prn. Also taking ibuprofen and tylenol.  Patient risk factors: Current OEVOJ-50 risk of complications score: 1 Smoking status: Kendra Patton  reports that she quit smoking about 5 years ago. She has never used smokeless tobacco. If female, currently pregnant? []   Yes [x]   No   Recurrent BV She has a history of recurrent BV which she is prescribed metronidazole gel for.  She states that she is having ongoing symptoms and recently lost her medication.  She needs a refill of this today.   ADHD Patient is currently prescribed Adderall 30 mg extended release daily.  She is tolerating well.  She is almost out of this medication.  She was supposed to have psychiatry take over her medication management however per referral she was contacted 3 times and never returned the phone call.  She tells me today that her phone was not working for a little bit last month.  ROS: See pertinent positives and negatives per HPI.  Patient Active Problem List    Diagnosis Date Noted  . 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 Polycystic ovary syndrome 12/24/2019  . Vaginal delivery 02/29/2016  . Anxiety - dx'd at age 88 02/28/2016  . Eczema 02/28/2016  . IBS (irritable bowel syndrome) 02/28/2016  . Scoliosis 02/28/2016  . Seasonal allergies 02/28/2016  . Allergy history, drug -- Compazine (dystonic rxn; seizures) 02/27/2016  . High-risk pregnancy supervision 10/27/2015  . Von Willebrand disease, type I ([place]) 07/11/2015  . 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 Asthma 07/11/2015    Social History   Tobacco Use  . Smoking status: Former Smoker    Quit date: 05/12/2015    Years since quitting: 5.9  . Smokeless tobacco: Never Used  Substance Use Topics  . Alcohol use: Yes    Alcohol/week: 0.0 standard drinks    Comment: 2-3 beers per day    Current Outpatient Medications:  .  albuterol (VENTOLIN HFA) 108 (90 Base) MCG/ACT inhaler, INHALE 1 PUFF INTO THE LUNGS EVERY 6 (SIX) HOURS AS NEEDED FOR WHEEZING OR SHORTNESS OF BREATH., Disp: 6.7 g, Rfl: 5 .  buPROPion (WELLBUTRIN XL) 300 MG 24 hr tablet, TAKE 1 TABLET BY MOUTH EVERY DAY, Disp: 90 tablet, Rfl: 1 .  clonazePAM (KLONOPIN) 0.5 MG tablet, Take 0.5 tablets (0.25 mg total) by mouth 2 (two) times daily as needed for anxiety., Disp: 60 tablet, Rfl: 0 .  ferrous sulfate 325 (65 FE) MG EC tablet, Take 1 tablet (325 mg total) by mouth daily with breakfast., Disp: 90 tablet, Rfl: 3 .  nirmatrelvir/ritonavir EUA (PAXLOVID) TABS, Take 3 tablets by mouth 2 (two) times daily  for 5 days., Disp: 30 tablet, Rfl: 0 .  predniSONE (DELTASONE) 20 MG tablet, Take 2 tablets (40 mg total) by mouth daily., Disp: 10 tablet, Rfl: 0 .  spironolactone (ALDACTONE) 25 MG tablet, Take 50 mg by mouth daily., Disp: , Rfl:  .  Vitamin D, Ergocalciferol, (DRISDOL) 1.25 MG (50000 UNIT) CAPS capsule, Take once capsule by mouth once a week for 12 weeks, Disp: 5 capsule, Rfl: 3 .  amphetamine-dextroamphetamine  (ADDERALL XR) 30 MG 24 hr capsule, Take 1 capsule (30 mg total) by mouth every morning., Disp: 30 capsule, Rfl: 0 .  metroNIDAZOLE (METROGEL) 0.75 % gel, Apply one application twice per week to prevent BV recurrence, Disp: 45 g, Rfl: 1  Allergies  Allergen Reactions  . Aspirin Other (See Comments)    "Thins bloods too much"  . Compazine [Prochlorperazine Edisylate]     Dystonic.  Marland Kitchen Other     Compazine    Objective:   VITALS: Per patient if applicable, see vitals. GENERAL: Alert, appears well and in no acute distress. HEENT: Atraumatic, conjunctiva clear, no obvious abnormalities on inspection of external nose and ears. NECK: Normal movements of the head and neck. CARDIOPULMONARY: No increased WOB. Speaking in clear sentences. I:E ratio WNL.  MS: Moves all visible extremities without noticeable abnormality. PSYCH: Pleasant and cooperative, well-groomed. Speech normal rate and rhythm. Affect is appropriate. Insight and judgement are appropriate. Attention is focused, linear, and appropriate.  NEURO: CN grossly intact. Oriented as arrived to appointment on time with no prompting. Moves both UE equally.  SKIN: No obvious lesions, wounds, erythema, or cyanosis noted on face or hands.  Assessment and Plan:   Kendra Patton was seen today for covid positive.  Diagnoses and all orders for this visit:  COVID-19 No red flags on discussion, patient is not in any obvious distress during our visit. Due to hx of asthma, I have sent in oral prednisone 40 mg x 5 days.  Patient is interested in [place]. I have sent this in for patient and also discussed that this medication is still under the FDA EUA and full long-term side effect profile is unknown.  Benefits/risks of medication discussed.  Patient currently has no contraindications for taking this medication.  We reviewed current medications and most recent GFR.  They are aware of risks of medication and wishes to proceed. I advised that this  medication has numerous potential drug interactions and they should confirm with their pharmacist that this medication is safe to take with their current prescriptions prior to starting.  Discussed over the counter supportive care options, including Tylenol 500 mg q 8 hours, with recommendations to push fluids and rest. Reviewed return precautions including new/worsening fever, SOB, new/worsening cough, sudden onset changes of symptoms. Recommended need to self-quarantine and practice social distancing until symptoms resolve. I recommend that patient follow-up if symptoms worsen or persist despite treatment x 7-10 days, sooner if needed.  BV (bacterial vaginosis) Metrogel refill sent.  Attention deficit disorder (ADD) without hyperactivity PDMP reviewed, without red flags. I have given one month refill of adderall xr 30 mg daily. I have also resubmitted her psychiatry referral and discussed that she needs to reach out to our office if she has not heard anything about referral in 2-4 weeks, as PCP wants psychiatry to manage ADHD. She verbalized understanding. -     Ambulatory referral to Psychiatry -     amphetamine-dextroamphetamine (ADDERALL XR) 30 MG 24 hr capsule; Take 1 capsule (30 mg total) by mouth every  morning.  Other orders -     predniSONE (DELTASONE) 20 MG tablet; Take 2 tablets (40 mg total) by mouth daily. -     metroNIDAZOLE (METROGEL) 0.75 % gel; Apply one application twice per week to prevent BV recurrence -     nirmatrelvir/ritonavir EUA (PAXLOVID) TABS; Take 3 tablets by mouth 2 (two) times daily for 5 days.  I discussed the assessment and treatment plan with the patient. The patient was provided an opportunity to ask questions and all were answered. The patient agreed with the plan and demonstrated an understanding of the instructions.   The patient was advised to call back or seek an in-person evaluation if the symptoms worsen or if the condition fails to improve as  anticipated.     [place], [place] 04/28/2021

## 2021-04-29 ENCOUNTER — Ambulatory Visit: Payer: BC Managed Care – PPO | Admitting: Orthopaedic Surgery

## 2021-05-08 ENCOUNTER — Other Ambulatory Visit: Payer: Self-pay

## 2021-05-08 ENCOUNTER — Encounter (HOSPITAL_BASED_OUTPATIENT_CLINIC_OR_DEPARTMENT_OTHER): Payer: Self-pay | Admitting: Emergency Medicine

## 2021-05-08 ENCOUNTER — Emergency Department (HOSPITAL_BASED_OUTPATIENT_CLINIC_OR_DEPARTMENT_OTHER): Payer: BC Managed Care – PPO | Admitting: Radiology

## 2021-05-08 ENCOUNTER — Emergency Department (HOSPITAL_BASED_OUTPATIENT_CLINIC_OR_DEPARTMENT_OTHER)
Admission: EM | Admit: 2021-05-08 | Discharge: 2021-05-08 | Disposition: A | Discharge status: Home / Self Care | Payer: BC Managed Care – PPO | Attending: Emergency Medicine | Admitting: Emergency Medicine

## 2021-05-08 DIAGNOSIS — R0602 Shortness of breath: Secondary | ICD-10-CM | POA: Diagnosis not present

## 2021-05-08 DIAGNOSIS — R000 Tachycardia, unspecified: Secondary | ICD-10-CM | POA: Insufficient documentation

## 2021-05-08 DIAGNOSIS — J45909 Unspecified asthma, uncomplicated: Secondary | ICD-10-CM | POA: Insufficient documentation

## 2021-05-08 DIAGNOSIS — R059 Cough, unspecified: Secondary | ICD-10-CM

## 2021-05-08 DIAGNOSIS — Z87891 Personal history of nicotine dependence: Secondary | ICD-10-CM | POA: Insufficient documentation

## 2021-05-08 LAB — BASIC METABOLIC PANEL
Anion gap: 10 (ref 5–15)
BUN: 6 mg/dL (ref 6–20)
CO2: 22 mmol/L (ref 22–32)
Calcium: 9.2 mg/dL (ref 8.9–10.3)
Chloride: 107 mmol/L (ref 98–111)
Creatinine, Ser: 0.6 mg/dL (ref 0.44–1.00)
GFR, Estimated: >60 mL/min (ref >60)
Glucose, Bld: 101 mg/dL — ABNORMAL HIGH (ref 70–99)
Potassium: 4.1 mmol/L (ref 3.5–5.1)
Sodium: 139 mmol/L (ref 135–145)

## 2021-05-08 LAB — CBC WITH DIFFERENTIAL/PLATELET
Abs Immature Granulocytes: 0.02 10*3/uL (ref 0.00–0.07)
Basophils Absolute: 0 10*3/uL (ref 0.0–0.1)
Basophils Relative: 1 %
Eosinophils Absolute: 0.2 10*3/uL (ref 0.0–0.5)
Eosinophils Relative: 3 %
HCT: 44.5 % (ref 36.0–46.0)
Hemoglobin: 15.3 g/dL — ABNORMAL HIGH (ref 12.0–15.0)
Immature Granulocytes: 0 %
Lymphocytes Relative: 36 %
Lymphs Abs: 2.4 10*3/uL (ref 0.7–4.0)
MCH: 30.5 pg (ref 26.0–34.0)
MCHC: 34.4 g/dL (ref 30.0–36.0)
MCV: 88.8 fL (ref 80.0–100.0)
Monocytes Absolute: 0.6 10*3/uL (ref 0.1–1.0)
Monocytes Relative: 10 %
Neutro Abs: 3.4 10*3/uL (ref 1.7–7.7)
Neutrophils Relative %: 50 %
Platelets: 277 10*3/uL (ref 150–400)
RBC: 5.01 MIL/uL (ref 3.87–5.11)
RDW: 13.2 % (ref 11.5–15.5)
WBC: 6.7 10*3/uL (ref 4.0–10.5)
nRBC: 0 % (ref 0.0–0.2)

## 2021-05-08 LAB — D-DIMER, QUANTITATIVE: D-Dimer, Quant: 0.3 ug/mL-FEU (ref 0.00–0.50)

## 2021-05-08 MED ORDER — BENZONATATE 100 MG PO CAPS: 100.0000 mg | ORAL_CAPSULE | Freq: Three times a day (TID) | ORAL | 0 refills | Status: DC

## 2021-05-08 MED ORDER — SODIUM CHLORIDE 0.9 % IV BOLUS
1000.0000 mL | Freq: Once | INTRAVENOUS | Status: AC
  Administered 2021-05-08: 1000 mL via INTRAVENOUS

## 2021-05-08 NOTE — ED Notes (Signed)
Pt discharged home after verbalizing understanding of discharge instructions; nad noted. 

## 2021-05-08 NOTE — ED Provider Notes (Signed)
[facility] Provider Note  CSN: [id number] Arrival date & time: 05/08/21 1439    History Chief Complaint  Patient presents with  . Shortness of Breath    HPI  Kendra Patton is a 35 y.o. female with history of anxiety, asthma, and Von Willebrand disease reports she tested positive for Covid about 10 days ago, she had relatively mild symptoms. Took a course of prednisone but declined Paxlovid. She reports she continues to feel SOB like she cannot take a full deep breath with tightness, especially when she is trying to cough. She has been taking Mucinex, but not able to cough up any sputum. No further fevers.    Past Medical History:  Diagnosis Date  . Anxiety    age 38  . Asthma   . Depression   . Eczema   . High-risk pregnancy supervision 10/27/2015  . 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 History of OCD (obsessive compulsive disorder)   . IBS (irritable bowel syndrome)   . Scoliosis   . Von Willebrand disease ([place])     Past Surgical History:  Procedure Laterality Date  . WISDOM TOOTH EXTRACTION      Family History  Problem Relation Age of Onset  . Asthma Mother   . Hypertension Mother   . Cancer Mother        Type of Skin Cancer  . Diabetes Mother   . Depression Mother   . Heart disease Mother   . Varicose Veins Mother   . Multiple sclerosis Mother   . Cancer Father        Skin  . Deep vein thrombosis Father   . Asthma Sister   . Hypertension Sister   . Depression Sister   . Learning disabilities Sister   . Varicose Veins Sister   . Cancer Maternal Grandmother   . Cancer Paternal Grandfather        lung  . Asthma Sister   . Depression Sister   . Deafness Daughter     Social History   Tobacco Use  . Smoking status: Former Smoker    Quit date: 05/12/2015    Years since quitting: 5.9  . Smokeless tobacco: Never Used  Vaping Use  . Vaping Use: Never used  Substance Use Topics  . Alcohol use: Yes    Alcohol/week:  0.0 standard drinks    Comment: 2-3 beers per day  . Drug use: No     Home Medications Prior to Admission medications   Medication Sig Start Date End Date Taking? Authorizing Provider  benzonatate (TESSALON) 100 MG capsule Take 1 capsule (100 mg total) by mouth every 8 (eight) hours. 05/08/21  Yes Truddie Hidden, MD  albuterol (VENTOLIN HFA) 108 (90 Base) MCG/ACT inhaler INHALE 1 PUFF INTO THE LUNGS EVERY 6 (SIX) HOURS AS NEEDED FOR WHEEZING OR SHORTNESS OF BREATH. 05/27/20   Brunetta Jeans, PA-C  amphetamine-dextroamphetamine (ADDERALL XR) 30 MG 24 hr capsule Take 1 capsule (30 mg total) by mouth every morning. 04/28/21   Inda Coke, PA  buPROPion (WELLBUTRIN XL) 300 MG 24 hr tablet TAKE 1 TABLET BY MOUTH EVERY DAY 01/20/21   Midge Minium, MD  clonazePAM (KLONOPIN) 0.5 MG tablet Take 0.5 tablets (0.25 mg total) by mouth 2 (two) times daily as needed for anxiety. 03/18/21 04/17/21  Allwardt, Randa Evens, PA-C  ferrous sulfate 325 (65 FE) MG EC tablet Take 1 tablet (325 mg total) by mouth daily with breakfast. 01/05/21  Brunetta Jeans, PA-C  metroNIDAZOLE (METROGEL) 0.75 % gel Apply one application twice per week to prevent BV recurrence 04/28/21   Inda Coke, PA  predniSONE (DELTASONE) 20 MG tablet Take 2 tablets (40 mg total) by mouth daily. 04/28/21   Inda Coke, PA  spironolactone (ALDACTONE) 25 MG tablet Take 50 mg by mouth daily.    [provider]  Vitamin D, Ergocalciferol, (DRISDOL) 1.25 MG (50000 UNIT) CAPS capsule Take once capsule by mouth once a week for 12 weeks 01/05/21   Brunetta Jeans, PA-C     Allergies    Aspirin, Compazine [prochlorperazine edisylate], and Other   Review of Systems   Review of Systems A comprehensive review of systems was completed and negative except as noted in HPI.    Physical Exam BP (!) 138/102   Pulse (!) 109   Temp 98.7 F (37.1 C) (Oral)   Resp 16   Ht 5\' 7"  (1.702 m)   Wt 79.4 kg   SpO2 100%   BMI  27.41 kg/m   Physical Exam Vitals and nursing note reviewed.  Constitutional:      Appearance: Normal appearance.  HENT:     Head: Normocephalic and atraumatic.     Nose: Nose normal.     Mouth/Throat:     Mouth: Mucous membranes are moist.  Eyes:     Extraocular Movements: Extraocular movements intact.     Conjunctiva/sclera: Conjunctivae normal.  Cardiovascular:     Rate and Rhythm: Tachycardia present.  Pulmonary:     Effort: Pulmonary effort is normal. No respiratory distress.     Breath sounds: Normal breath sounds. No wheezing or rhonchi.  Abdominal:     General: Abdomen is flat.     Palpations: Abdomen is soft.     Tenderness: There is no abdominal tenderness.  Musculoskeletal:        General: No swelling. Normal range of motion.     Cervical back: Neck supple.  Skin:    General: Skin is warm and dry.  Neurological:     General: No focal deficit present.     Mental Status: She is alert.  Psychiatric:        Mood and Affect: Mood normal.      ED Results / Procedures / Treatments   Labs (all labs ordered are listed, but only abnormal results are displayed) Labs Reviewed  BASIC METABOLIC PANEL - Abnormal; Notable for the following components:      Result Value   Glucose, Bld 101 (*)    All other components within normal limits  CBC WITH DIFFERENTIAL/PLATELET - Abnormal; Notable for the following components:   Hemoglobin 15.3 (*)    All other components within normal limits  D-DIMER, QUANTITATIVE    EKG EKG Interpretation  Date/Time:  Friday May 08 2021 16:23:51 EDT Ventricular Rate:  98 PR Interval:  137 QRS Duration: 86 QT Interval:  353 QTC Calculation: 451 R Axis:   63 Text Interpretation: Sinus rhythm Since last tracing Rate faster Otherwise no significant change Confirmed by Calvert Cantor [phone]) on 05/08/2021 4:30:15 PM    Radiology DG Chest 2 View  Result Date: 05/08/2021 CLINICAL DATA:  Cough bronchitis EXAM: CHEST - 2 VIEW  COMPARISON:  None. FINDINGS: The heart size and mediastinal contours are within normal limits. Both lungs are clear. The visualized skeletal structures are unremarkable. IMPRESSION: No active cardiopulmonary disease. Electronically Signed   By: Donavan Foil M.D.   On: 05/08/2021 16:21    Procedures  Procedures  Medications Ordered in the ED Medications  sodium chloride 0.9 % bolus 1,000 mL (0 mLs Intravenous Stopped 05/08/21 1809)     MDM Rules/Calculators/A&P MDM Patient well appearing with symptoms of SOB and chest tightness 10 days post Covid. Not febrile or hypoxic but mild tachycardia. She has Von Willebrand so PE is less likely, but will check CBC, BMP and Dimer. CXR and IVF in the meantime.  ED Course  I have reviewed the triage vital signs and the nursing notes.  Pertinent labs & imaging results that were available during my care of the patient were reviewed by me and considered in my medical decision making (see chart for details).  Clinical Course as of 05/08/21 2310  Fri May 08, 2021  1540 CBC is normal.  [CS]  9169 Dimer is neg. BMP is normal.  [CS]  4503 CXR is clear.  [CS]  8882 Patient remains well appearing. Still mildly tachycardic but no hypoxia. Suspect she is still recovering from Covid infection. Plan discharged, Rx for tessalon, PCP follow up or RTED if symptoms worsen.  [CS]    Clinical Course User Index [CS] Truddie Hidden, MD    Final Clinical Impression(s) / ED Diagnoses Final diagnoses:  Cough    Rx / DC Orders ED Discharge Orders         Ordered    benzonatate (TESSALON) 100 MG capsule  Every 8 hours        05/08/21 1803           Truddie Hidden, MD 05/08/21 2310

## 2021-05-08 NOTE — ED Triage Notes (Signed)
Shortness of breath x 3 days ago, chest tightness as well. Covid + x 10 days. Hx asthma

## 2021-05-09 ENCOUNTER — Emergency Department (HOSPITAL_BASED_OUTPATIENT_CLINIC_OR_DEPARTMENT_OTHER)
Admission: EM | Admit: 2021-05-09 | Discharge: 2021-05-09 | Disposition: A | Discharge status: Home / Self Care | Payer: BC Managed Care – PPO | Attending: Emergency Medicine | Admitting: Emergency Medicine

## 2021-05-09 ENCOUNTER — Encounter (HOSPITAL_BASED_OUTPATIENT_CLINIC_OR_DEPARTMENT_OTHER): Payer: Self-pay | Admitting: Emergency Medicine

## 2021-05-09 ENCOUNTER — Emergency Department (HOSPITAL_BASED_OUTPATIENT_CLINIC_OR_DEPARTMENT_OTHER): Payer: BC Managed Care – PPO

## 2021-05-09 DIAGNOSIS — R202 Paresthesia of skin: Secondary | ICD-10-CM | POA: Insufficient documentation

## 2021-05-09 DIAGNOSIS — R519 Headache, unspecified: Secondary | ICD-10-CM | POA: Insufficient documentation

## 2021-05-09 DIAGNOSIS — R000 Tachycardia, unspecified: Secondary | ICD-10-CM | POA: Diagnosis not present

## 2021-05-09 DIAGNOSIS — J45909 Unspecified asthma, uncomplicated: Secondary | ICD-10-CM | POA: Diagnosis not present

## 2021-05-09 DIAGNOSIS — Z87891 Personal history of nicotine dependence: Secondary | ICD-10-CM | POA: Insufficient documentation

## 2021-05-09 DIAGNOSIS — R200 Anesthesia of skin: Secondary | ICD-10-CM | POA: Diagnosis present

## 2021-05-09 DIAGNOSIS — Z8616 Personal history of COVID-19: Secondary | ICD-10-CM | POA: Diagnosis not present

## 2021-05-09 LAB — RAPID URINE DRUG SCREEN, HOSP PERFORMED
Amphetamines: POSITIVE — AB
Barbiturates: NOT DETECTED
Benzodiazepines: NOT DETECTED
Cocaine: NOT DETECTED
Opiates: NOT DETECTED
Tetrahydrocannabinol: POSITIVE — AB

## 2021-05-09 LAB — CBC WITH DIFFERENTIAL/PLATELET
Abs Immature Granulocytes: 0.03 10*3/uL (ref 0.00–0.07)
Basophils Absolute: 0.1 10*3/uL (ref 0.0–0.1)
Basophils Relative: 1 %
Eosinophils Absolute: 0.2 10*3/uL (ref 0.0–0.5)
Eosinophils Relative: 2 %
HCT: 44 % (ref 36.0–46.0)
Hemoglobin: 15.1 g/dL — ABNORMAL HIGH (ref 12.0–15.0)
Immature Granulocytes: 0 %
Lymphocytes Relative: 31 %
Lymphs Abs: 2.7 10*3/uL (ref 0.7–4.0)
MCH: 31 pg (ref 26.0–34.0)
MCHC: 34.3 g/dL (ref 30.0–36.0)
MCV: 90.3 fL (ref 80.0–100.0)
Monocytes Absolute: 0.7 10*3/uL (ref 0.1–1.0)
Monocytes Relative: 8 %
Neutro Abs: 5.2 10*3/uL (ref 1.7–7.7)
Neutrophils Relative %: 58 %
Platelets: 279 10*3/uL (ref 150–400)
RBC: 4.87 MIL/uL (ref 3.87–5.11)
RDW: 13.1 % (ref 11.5–15.5)
WBC: 8.8 10*3/uL (ref 4.0–10.5)
nRBC: 0 % (ref 0.0–0.2)

## 2021-05-09 LAB — URINALYSIS, ROUTINE W REFLEX MICROSCOPIC
Bilirubin Urine: NEGATIVE
Glucose, UA: NEGATIVE mg/dL
Hgb urine dipstick: NEGATIVE
Ketones, ur: 15 mg/dL — AB
Leukocytes,Ua: NEGATIVE
Nitrite: NEGATIVE
Protein, ur: NEGATIVE mg/dL
Specific Gravity, Urine: 1.012 (ref 1.005–1.030)
pH: 5.5 (ref 5.0–8.0)

## 2021-05-09 LAB — COMPREHENSIVE METABOLIC PANEL
ALT: 19 U/L (ref 0–44)
AST: 17 U/L (ref 15–41)
Albumin: 4.5 g/dL (ref 3.5–5.0)
Alkaline Phosphatase: 53 U/L (ref 38–126)
Anion gap: 10 (ref 5–15)
BUN: 8 mg/dL (ref 6–20)
CO2: 23 mmol/L (ref 22–32)
Calcium: 9.1 mg/dL (ref 8.9–10.3)
Chloride: 103 mmol/L (ref 98–111)
Creatinine, Ser: 0.61 mg/dL (ref 0.44–1.00)
GFR, Estimated: >60 mL/min (ref >60)
Glucose, Bld: 97 mg/dL (ref 70–99)
Potassium: 4 mmol/L (ref 3.5–5.1)
Sodium: 136 mmol/L (ref 135–145)
Total Bilirubin: 0.6 mg/dL (ref 0.3–1.2)
Total Protein: 7.8 g/dL (ref 6.5–8.1)

## 2021-05-09 LAB — PREGNANCY, URINE: Preg Test, Ur: NEGATIVE — AB

## 2021-05-09 LAB — ETHANOL: Alcohol, Ethyl (B): <10 mg/dL (ref <10)

## 2021-05-09 LAB — MAGNESIUM: Magnesium: 1.9 mg/dL (ref 1.7–2.4)

## 2021-05-09 MED ORDER — LACTATED RINGERS IV BOLUS
1000.0000 mL | Freq: Once | INTRAVENOUS | Status: AC
  Administered 2021-05-09: 1000 mL via INTRAVENOUS

## 2021-05-09 MED ORDER — METOCLOPRAMIDE HCL 5 MG/ML IJ SOLN
10.0000 mg | Freq: Once | INTRAMUSCULAR | Status: AC
  Administered 2021-05-09: 10 mg via INTRAVENOUS
  Filled 2021-05-09: qty 2

## 2021-05-09 MED ORDER — LORAZEPAM 2 MG/ML IJ SOLN
1.0000 mg | Freq: Once | INTRAMUSCULAR | Status: AC
  Administered 2021-05-09: 1 mg via INTRAVENOUS
  Filled 2021-05-09: qty 1

## 2021-05-09 MED ORDER — DIPHENHYDRAMINE HCL 50 MG/ML IJ SOLN
25.0000 mg | Freq: Once | INTRAMUSCULAR | Status: AC
  Administered 2021-05-09: 25 mg via INTRAVENOUS
  Filled 2021-05-09: qty 1

## 2021-05-09 MED ORDER — SODIUM CHLORIDE 0.9 % IV BOLUS
1000.0000 mL | Freq: Once | INTRAVENOUS | Status: AC
  Administered 2021-05-09: 1000 mL via INTRAVENOUS

## 2021-05-09 NOTE — ED Triage Notes (Signed)
Pt states all of a sudden having numbness/tingling all over,muscle spasms all over body. Pt takes adderall, energy drink and guiafenisin.

## 2021-05-09 NOTE — ED Provider Notes (Signed)
[facility] EMERGENCY DEPT Provider Note   CSN: [id number] Arrival date & time: 05/09/21  1807     History Chief Complaint  Patient presents with  . Numbness    Kendra Patton is a 35 y.o. female.  HPI 35 year old female presents with paresthesias and some numbness.  About an hour ago she started feeling a pins and needle sensation throughout her entire body.  She was at the grocery store when this happened.  Has also been sweating all day.  She has been taking meds for cough recently including Robitussin-DM and also is on Adderall and has taken an energy drink today.  She has been having a headache ever since she developed COVID on 5/10 this behind her left eye.  No focal weakness.  However her left face feels different compared to her right, also starting an hour ago. She had transient blurry vision that resolved.   Past Medical History:  Diagnosis Date  . Anxiety    age 33  . Asthma   . Depression   . Eczema   . High-risk pregnancy supervision 10/27/2015  . 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 History of OCD (obsessive compulsive disorder)   . IBS (irritable bowel syndrome)   . Scoliosis   . Von Willebrand disease [facility])     Patient Active Problem List   Diagnosis Date Noted  . 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 Polycystic ovary syndrome 12/24/2019  . Vaginal delivery 02/29/2016  . Anxiety - dx'd at age 85 02/28/2016  . Eczema 02/28/2016  . IBS (irritable bowel syndrome) 02/28/2016  . Scoliosis 02/28/2016  . Seasonal allergies 02/28/2016  . Allergy history, drug -- Compazine (dystonic rxn; seizures) 02/27/2016  . High-risk pregnancy supervision 10/27/2015  . Von Willebrand disease, type I ([place]) 07/11/2015  . 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 Asthma 07/11/2015    Past Surgical History:  Procedure Laterality Date  . WISDOM TOOTH EXTRACTION       OB History    Gravida  2   Para  2   Term  2   Preterm      AB       Living  2     SAB      IAB      Ectopic      Multiple  0   Live Births  2           Family History  Problem Relation Age of Onset  . Asthma Mother   . Hypertension Mother   . Cancer Mother        Type of Skin Cancer  . Diabetes Mother   . Depression Mother   . Heart disease Mother   . Varicose Veins Mother   . Multiple sclerosis Mother   . Cancer Father        Skin  . Deep vein thrombosis Father   . Asthma Sister   . Hypertension Sister   . Depression Sister   . Learning disabilities Sister   . Varicose Veins Sister   . Cancer Maternal Grandmother   . Cancer Paternal Grandfather        lung  . Asthma Sister   . Depression Sister   . Deafness Daughter     Social History   Tobacco Use  . Smoking status: Former Smoker    Quit date: 05/12/2015    Years since quitting: 5.9  . Smokeless tobacco: Never Used  Vaping Use  .  Vaping Use: Never used  Substance Use Topics  . Alcohol use: Yes    Alcohol/week: 0.0 standard drinks    Comment: 2-3 beers per day  . Drug use: No    Home Medications Prior to Admission medications   Medication Sig Start Date End Date Taking? Authorizing Provider  albuterol (VENTOLIN HFA) 108 (90 Base) MCG/ACT inhaler INHALE 1 PUFF INTO THE LUNGS EVERY 6 (SIX) HOURS AS NEEDED FOR WHEEZING OR SHORTNESS OF BREATH. 05/27/20   Brunetta Jeans, PA-C  amphetamine-dextroamphetamine (ADDERALL XR) 30 MG 24 hr capsule Take 1 capsule (30 mg total) by mouth every morning. 04/28/21   Inda Coke, PA  benzonatate (TESSALON) 100 MG capsule Take 1 capsule (100 mg total) by mouth every 8 (eight) hours. 05/08/21   Truddie Hidden, MD  buPROPion (WELLBUTRIN XL) 300 MG 24 hr tablet TAKE 1 TABLET BY MOUTH EVERY DAY 01/20/21   Midge Minium, MD  clonazePAM (KLONOPIN) 0.5 MG tablet Take 0.5 tablets (0.25 mg total) by mouth 2 (two) times daily as needed for anxiety. 03/18/21 04/17/21  Allwardt, Randa Evens, PA-C  ferrous sulfate 325 (65 FE) MG EC tablet  Take 1 tablet (325 mg total) by mouth daily with breakfast. 01/05/21   Brunetta Jeans, PA-C  metroNIDAZOLE (METROGEL) 0.75 % gel Apply one application twice per week to prevent BV recurrence 04/28/21   Inda Coke, PA  predniSONE (DELTASONE) 20 MG tablet Take 2 tablets (40 mg total) by mouth daily. 04/28/21   Inda Coke, PA  spironolactone (ALDACTONE) 25 MG tablet Take 50 mg by mouth daily.    [provider]  Vitamin D, Ergocalciferol, (DRISDOL) 1.25 MG (50000 UNIT) CAPS capsule Take once capsule by mouth once a week for 12 weeks 01/05/21   Brunetta Jeans, PA-C    Allergies    Aspirin, Compazine [prochlorperazine edisylate], and Other  Review of Systems   Review of Systems  Constitutional: Positive for diaphoresis. Negative for fever.  Eyes: Positive for visual disturbance.  Respiratory: Positive for cough. Negative for shortness of breath.   Cardiovascular: Negative for chest pain.  Neurological: Positive for numbness and headaches.  Psychiatric/Behavioral: The patient is nervous/anxious.   All other systems reviewed and are negative.   Physical Exam Updated Vital Signs BP 138/82 (BP Location: Right Arm)   Pulse 92   Temp 98.3 F (36.8 C) (Oral)   Resp 20   SpO2 100%   Physical Exam Vitals and nursing note reviewed.  Constitutional:      Appearance: She is well-developed. She is not ill-appearing.  HENT:     Head: Normocephalic and atraumatic.     Right Ear: External ear normal.     Left Ear: External ear normal.     Nose: Nose normal.  Eyes:     General:        Right eye: No discharge.        Left eye: No discharge.  Cardiovascular:     Rate and Rhythm: Regular rhythm. Tachycardia present.     Heart sounds: Normal heart sounds.  Pulmonary:     Effort: Pulmonary effort is normal.     Breath sounds: Normal breath sounds.  Abdominal:     Palpations: Abdomen is soft.     Tenderness: There is no abdominal tenderness.  Skin:    General: Skin is  warm and dry.  Neurological:     Mental Status: She is alert.     Comments: CN 3-12 grossly intact besides decreased sensation  on left side of face. 5/5 strength in all 4 extremities. Grossly normal sensation. Normal finger to nose.   Psychiatric:        Mood and Affect: Mood is anxious.     ED Results / Procedures / Treatments   Labs (all labs ordered are listed, but only abnormal results are displayed) Labs Reviewed  CBC WITH DIFFERENTIAL/PLATELET - Abnormal; Notable for the following components:      Result Value   Hemoglobin 15.1 (*)    All other components within normal limits  URINALYSIS, ROUTINE W REFLEX MICROSCOPIC - Abnormal; Notable for the following components:   Ketones, ur 15 (*)    All other components within normal limits  RAPID URINE DRUG SCREEN, HOSP PERFORMED - Abnormal; Notable for the following components:   Amphetamines POSITIVE (*)    Tetrahydrocannabinol POSITIVE (*)    All other components within normal limits  PREGNANCY, URINE - Abnormal; Notable for the following components:   Preg Test, Ur NEGATIVE (*)    All other components within normal limits  COMPREHENSIVE METABOLIC PANEL  ETHANOL  MAGNESIUM  CBG MONITORING, ED    EKG EKG Interpretation  Date/Time:  Saturday May 09 2021 18:39:40 EDT Ventricular Rate:  108 PR Interval:  130 QRS Duration: 83 QT Interval:  333 QTC Calculation: 447 R Axis:   66 Text Interpretation: Sinus tachycardia rate is faster compared to yesterday otherwise no acute ST/T changes Confirmed by Sherwood Gambler [phone]) on 05/09/2021 6:45:07 PM   Radiology DG Chest 2 View  Result Date: 05/08/2021 CLINICAL DATA:  Cough bronchitis EXAM: CHEST - 2 VIEW COMPARISON:  None. FINDINGS: The heart size and mediastinal contours are within normal limits. Both lungs are clear. The visualized skeletal structures are unremarkable. IMPRESSION: No active cardiopulmonary disease. Electronically Signed   By: Donavan Foil M.D.   On: 05/08/2021  16:21   CT Head Wo Contrast  Result Date: 05/09/2021 CLINICAL DATA:  Whole body numbness. EXAM: CT HEAD WITHOUT CONTRAST TECHNIQUE: Contiguous axial images were obtained from the base of the skull through the vertex without intravenous contrast. COMPARISON:  None. FINDINGS: Brain: No evidence of acute infarction, hemorrhage, hydrocephalus, extra-axial collection or mass lesion/mass effect. Vascular: No hyperdense vessel or unexpected calcification. Skull: Normal. Negative for fracture or focal lesion. Sinuses/Orbits: No acute finding. Other: None. IMPRESSION: Normal head CT. Electronically Signed   By: Marijo Conception M.D.   On: 05/09/2021 19:33    Procedures Procedures   Medications Ordered in ED Medications  sodium chloride 0.9 % bolus 1,000 mL (0 mLs Intravenous Stopped 05/09/21 2025)  LORazepam (ATIVAN) injection 1 mg (1 mg Intravenous Given 05/09/21 1906)  metoCLOPramide (REGLAN) injection 10 mg (10 mg Intravenous Given 05/09/21 1956)  diphenhydrAMINE (BENADRYL) injection 25 mg (25 mg Intravenous Given 05/09/21 1955)  LORazepam (ATIVAN) injection 1 mg (1 mg Intravenous Given 05/09/21 1956)  lactated ringers bolus 1,000 mL (0 mLs Intravenous Stopped 05/09/21 2140)    ED Course  I have reviewed the triage vital signs and the nursing notes.  Pertinent labs & imaging results that were available during my care of the patient were reviewed by me and considered in my medical decision making (see chart for details).  Clinical Course as of 05/09/21 2255  Sat May 09, 2021  1842 While the patient is describing unilateral facial numbness compared to the right, she is also describing diffuse paresthesias.  My suspicion is this is not an acute stroke and I do not think a code stroke needs  to be called.  We will try Ativan as she is quite anxious and tremulous at this point.  Will check electrolytes and CT head given her von Maurine Minister. [SG]    Clinical Course User Index [SG] Sherwood Gambler, MD    MDM Rules/Calculators/A&P                          Patient's primary complaint is paresthesias and trembling.  While she does have unilateral facial "numbness" there is no obvious weakness, speech difficulty.  Does not really seem like a stroke.  She does have a headache associated with this and I wonder if this is complicated migraine.  Her numbness has resolved after a couple doses of Ativan, fluids, and headache cocktail.  She feels like now there is a little bit of blood twitch/tremor to her mouth.  This does not seem consistent with a seizure.  Overall, I wonder if this could be anxiety related or related to excessive stimulant use with the Robitussin-DM, Adderall, and energy drink all use today.  Discussed she should cut back on caffeine for now.  Electrolytes are okay.  We discussed plus minus of going to [facility] for an MRI but given the numbness has resolved I think it is unlikely this would be positive for an emergent stroke.  Thus patient and I decided to have her discharged and follow-up with neurology as an outpatient.  Given return precautions. Final Clinical Impression(s) / ED Diagnoses Final diagnoses:  Paresthesia    Rx / DC Orders ED Discharge Orders         Ordered    Ambulatory referral to Neurology       Comments: An appointment is requested in approximately: 2 weeks   05/09/21 2125           Sherwood Gambler, MD 05/09/21 2257

## 2021-05-09 NOTE — Discharge Instructions (Addendum)
If you develop continued, recurrent, or worsening headache, fever, neck stiffness, vomiting, blurry or double vision, weakness or numbness in your arms or legs, trouble speaking, or any other new/concerning symptoms then return to the ER for evaluation.  

## 2021-05-11 ENCOUNTER — Telehealth: Payer: Self-pay

## 2021-05-11 NOTE — Telephone Encounter (Signed)
Please let me know when patient has a scheduled visit with [place] [facility]-tell her I would like for her to get that scheduled before we refill this.  Adderall is a stimulant and may be causing her to need the clonazepam-I think we need psychiatry's input.  I am willing to refill once we get a visit scheduled

## 2021-05-11 NOTE — Telephone Encounter (Signed)
Called and spoke with pt and she said she is not going through [facility] but she is set up with [facility] and she has an appointment with them on this Thursday at 1:00pm.

## 2021-05-11 NOTE — Telephone Encounter (Signed)
I went to send this in but please verify what she is doing for birth control- wouldn't want to take this rx ideally if pregnant without ob input. Sorry since she is not my primary patient I am not sure

## 2021-05-11 NOTE — Telephone Encounter (Signed)
MEDICATION: clonazePAM (KLONOPIN) 0.5 MG tablet(Expired)  PHARMACY: [id number] - [place], [place] - [address] Phone:  [phone]  Fax:  [phone]       Comments: Patient is calling to get scheduled with [facility].   **Let patient know to contact pharmacy at the end of the day to make sure medication is ready. **  ** Please notify patient to allow 48-72 hours to process**  **Encourage patient to contact the pharmacy for refills or they can request refills through [facility]**

## 2021-05-12 MED ORDER — CLONAZEPAM 0.5 MG PO TABS: 0.2500 mg | ORAL_TABLET | Freq: Two times a day (BID) | ORAL | 0 refills | Status: DC | PRN

## 2021-05-12 NOTE — Telephone Encounter (Signed)
Called and spoke with pt and pt states she does not do anything for birth control b/c her boyfriend has had a vasectomy.

## 2021-05-12 NOTE — Telephone Encounter (Signed)
I sent this in for her

## 2021-05-15 ENCOUNTER — Encounter: Payer: Self-pay | Admitting: Neurology

## 2021-05-15 ENCOUNTER — Ambulatory Visit (INDEPENDENT_AMBULATORY_CARE_PROVIDER_SITE_OTHER): Payer: BC Managed Care – PPO | Admitting: Neurology

## 2021-05-15 VITALS — BP 132/87 | HR 109 | Ht 67.0 in | Wt 185.0 lb

## 2021-05-15 DIAGNOSIS — G253 Myoclonus: Secondary | ICD-10-CM | POA: Diagnosis not present

## 2021-05-15 DIAGNOSIS — R413 Other amnesia: Secondary | ICD-10-CM | POA: Diagnosis not present

## 2021-05-15 NOTE — Progress Notes (Signed)
Reason for visit: Paresthesias, myoclonus, memory problems  Referring physician: [place]  Kendra Patton is a 35 y.o. female  History of present illness:  Ms. Kendra Patton is a 35 year old right-handed white female with a history of attention deficit disorder, anxiety and depression, and a recent history of a COVID infection on 28 Apr 2021.  The patient was seen in the emergency room on 09 May 2021 with issues of sweats throughout the day, feeling hot and somewhat short winded.  The patient had tingling sensations throughout the body, she felt somewhat heavy on the left side of the face and was having increased problems with jerks and twitches in the arms and legs.  She also noted some problems with tremors.  She had an energy drink that day, no other caffeinated products however.  She is on Adderall and Wellbutrin.  She indicates that her anxiety issues are under relatively good control.  The patient goes on to say that she has had some decline in her handwriting over the last 2 years, she reports a several year history of decline in memory and concentration.  She feels that her balance is slightly off, she has not had any falls.  She has had an extensive blood work-up for various aches and pains throughout the body, she has had a B12 level, RPR, HIV panel, ANA, rheumatoid factor, thyroid profile as well as general chemistries and CBC.  No abnormalities were noted.  The patient does use delta 8, but she just started this recently which is helpful for her anxiety and for her discomfort.  She continues to have occasional jerks of the arms and legs, this has been an issue for over a year.  The recent ER visit was associated with a 2-hour duration episode of paresthesias and 2 to 3 hours of the myoclonic jerks.  She has not had loss of consciousness.  She comes to this office for an evaluation.  She claimed that the Wellbutrin dose was increased in January 2022 from 150 mg a day to 300 mg daily.  She has been  on the same dose of Adderall for about 2 years.  Past Medical History:  Diagnosis Date  . Anxiety    age 83  . Asthma   . Depression   . Eczema   . High-risk pregnancy supervision 10/27/2015  . 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 History of OCD (obsessive compulsive disorder)   . IBS (irritable bowel syndrome)   . Scoliosis   . Von Willebrand disease ([place])     Past Surgical History:  Procedure Laterality Date  . WISDOM TOOTH EXTRACTION      Family History  Problem Relation Age of Onset  . Asthma Mother   . Hypertension Mother   . Cancer Mother        Type of Skin Cancer  . Diabetes Mother   . Depression Mother   . Heart disease Mother   . Varicose Veins Mother   . Multiple sclerosis Mother   . Cancer Father        Skin  . Deep vein thrombosis Father   . Asthma Sister   . Hypertension Sister   . Depression Sister   . Learning disabilities Sister   . Varicose Veins Sister   . Cancer Maternal Grandmother   . Cancer Paternal Grandfather        lung  . Asthma Sister   . Depression Sister   . Deafness Daughter  Social history:  reports that she quit smoking about 6 years ago. She has never used smokeless tobacco. She reports current alcohol use. She reports that she does not use drugs.  Medications:  Prior to Admission medications   Medication Sig Start Date End Date Taking? Authorizing Provider  albuterol (VENTOLIN HFA) 108 (90 Base) MCG/ACT inhaler INHALE 1 PUFF INTO THE LUNGS EVERY 6 (SIX) HOURS AS NEEDED FOR WHEEZING OR SHORTNESS OF BREATH. 05/27/20  Yes Brunetta Jeans, PA-C  amphetamine-dextroamphetamine (ADDERALL XR) 30 MG 24 hr capsule Take 1 capsule (30 mg total) by mouth every morning. 04/28/21  Yes Inda Coke, PA  benzonatate (TESSALON) 100 MG capsule Take 1 capsule (100 mg total) by mouth every 8 (eight) hours. 05/08/21  Yes Truddie Hidden, MD  buPROPion (WELLBUTRIN XL) 300 MG 24 hr tablet TAKE 1 TABLET BY MOUTH EVERY DAY 01/20/21  Yes Midge Minium, MD  clonazePAM (KLONOPIN) 0.5 MG tablet Take 0.5 tablets (0.25 mg total) by mouth 2 (two) times daily as needed for anxiety. 05/12/21 06/11/21 Yes Marin Olp, MD  ferrous sulfate 325 (65 FE) MG EC tablet Take 1 tablet (325 mg total) by mouth daily with breakfast. 01/05/21  Yes Brunetta Jeans, PA-C  metroNIDAZOLE ( METROGEL) 0.75 % gel Apply one application twice per week to prevent BV recurrence 04/28/21  Yes Inda Coke, PA  spironolactone (ALDACTONE) 25 MG tablet Take 50 mg by mouth daily.   Yes [provider]  Vitamin D, Ergocalciferol, (DRISDOL) 1.25 MG (50000 UNIT) CAPS capsule Take once capsule by mouth once a week for 12 weeks 01/05/21  Yes Brunetta Jeans, PA-C      Allergies  Allergen Reactions  . Aspirin Other (See Comments)    "Thins bloods too much"  . Compazine [Prochlorperazine Edisylate]     Dystonic.  Marland Kitchen Other     Compazine    ROS:  Out of a complete 14 system review of symptoms, the patient complains only of the following symptoms, and all other reviewed systems are negative.  Jerks Decreased memory Anxiety  Blood pressure 132/87, pulse (!) 109, height 5\' 7"  (1.702 m), weight 185 lb (83.9 kg).  Physical Exam  General: The patient is alert and cooperative at the time of the examination.  Eyes: Pupils are equal, round, and reactive to light. Discs are flat bilaterally.  Neck: The neck is supple, no carotid bruits are noted.  Respiratory: The respiratory examination is clear.  Cardiovascular: The cardiovascular examination reveals a regular rate and rhythm, no obvious murmurs or rubs are noted.  Skin: Extremities are without significant edema.  Neurologic Exam  Mental status: The patient is alert and oriented x 3 at the time of the examination. The patient has apparent normal recent and remote memory, with an apparently normal attention span and concentration ability.  Cranial nerves: Facial symmetry is present. There is  good sensation of the face to pinprick and soft touch bilaterally. The strength of the facial muscles and the muscles to head turning and shoulder shrug are normal bilaterally. Speech is well enunciated, no aphasia or dysarthria is noted. Extraocular movements are full. Visual fields are full. The tongue is midline, and the patient has symmetric elevation of the soft palate. No obvious hearing deficits are noted.  Motor: The motor testing reveals 5 over 5 strength of all 4 extremities. Good symmetric motor tone is noted throughout.  Sensory: Sensory testing is intact to pinprick, soft touch, vibration sensation, and position sense on  all 4 extremities. No evidence of extinction is noted.  Coordination: Cerebellar testing reveals good finger-nose-finger and heel-to-shin bilaterally.  Gait and station: Gait is normal. Tandem gait is normal. Romberg is negative. No drift is seen.  Reflexes: Deep tendon reflexes are symmetric and normal bilaterally. Toes are downgoing bilaterally.   CT head 05/09/21:  IMPRESSION: Normal head CT.  * CT scan images were reviewed online. I agree with the written report.    Assessment/Plan:  1.  Reports of myoclonus  2.  Anxiety and depression  3.  ADD  4.  Reports of memory decline  The patient is on a combination of Adderall and Wellbutrin which may heighten anxiety, in combination with use of caffeinated products.  Adderall and Wellbutrin can potentially result in a low level of serotonin syndrome, this does need to be considered.  I suspect that reduction in the Adderall dose and a switch off of Wellbutrin may be helpful.  The patient is now seeing psychiatry, they are discussing the possibility of switching to Abilify.  The patient will be set up for an EEG study, and MRI of the brain.  She will follow-up in 4 months.  The patient currently is followed by Loletta Specter from psychiatry, telephone number is [phone].  The fax number is  [phone].  Jill Alexanders MD 05/15/2021 9:33 AM  [facility] [address] [place], [place] [address]  Phone [phone] Fax [phone]

## 2021-05-20 ENCOUNTER — Other Ambulatory Visit: Payer: Self-pay

## 2021-05-20 DIAGNOSIS — F320 Major depressive disorder, single episode, mild: Secondary | ICD-10-CM

## 2021-05-20 DIAGNOSIS — F988 Other specified behavioral and emotional disorders with onset usually occurring in childhood and adolescence: Secondary | ICD-10-CM

## 2021-05-20 MED ORDER — BUPROPION HCL ER (XL) 300 MG PO TB24: 1.0000 | ORAL_TABLET | Freq: Every day | ORAL | 0 refills | Status: DC

## 2021-05-26 ENCOUNTER — Ambulatory Visit (INDEPENDENT_AMBULATORY_CARE_PROVIDER_SITE_OTHER): Payer: BC Managed Care – PPO | Admitting: Allergy

## 2021-05-26 ENCOUNTER — Encounter: Payer: Self-pay | Admitting: Allergy

## 2021-05-26 ENCOUNTER — Other Ambulatory Visit: Payer: Self-pay

## 2021-05-26 VITALS — BP 120/84 | HR 115 | Temp 97.3°F | Resp 20 | Ht 67.0 in | Wt 181.0 lb

## 2021-05-26 DIAGNOSIS — H1013 Acute atopic conjunctivitis, bilateral: Secondary | ICD-10-CM | POA: Diagnosis not present

## 2021-05-26 DIAGNOSIS — J3089 Other allergic rhinitis: Secondary | ICD-10-CM

## 2021-05-26 DIAGNOSIS — J45909 Unspecified asthma, uncomplicated: Secondary | ICD-10-CM | POA: Diagnosis not present

## 2021-05-26 DIAGNOSIS — L301 Dyshidrosis [pompholyx]: Secondary | ICD-10-CM

## 2021-05-26 DIAGNOSIS — T781XXD Other adverse food reactions, not elsewhere classified, subsequent encounter: Secondary | ICD-10-CM | POA: Insufficient documentation

## 2021-05-26 MED ORDER — CETIRIZINE HCL 10 MG PO TABS: 10.0000 mg | ORAL_TABLET | Freq: Every day | ORAL | 5 refills | Status: DC

## 2021-05-26 MED ORDER — MONTELUKAST SODIUM 10 MG PO TABS: 10.0000 mg | ORAL_TABLET | Freq: Every day | ORAL | 5 refills | Status: DC

## 2021-05-26 MED ORDER — ALBUTEROL SULFATE HFA 108 (90 BASE) MCG/ACT IN AERS: 2.0000 | INHALATION_SPRAY | RESPIRATORY_TRACT | 1 refills | Status: DC | PRN

## 2021-05-26 MED ORDER — FLUTICASONE PROPIONATE 50 MCG/ACT NA SUSP: 1.0000 | Freq: Two times a day (BID) | NASAL | 5 refills | Status: DC | PRN

## 2021-05-26 MED ORDER — EPINEPHRINE 0.3 MG/0.3ML IJ SOAJ: 0.3000 mg | INTRAMUSCULAR | 1 refills | Status: DC | PRN

## 2021-05-26 MED ORDER — EUCRISA 2 % EX OINT: 1.0000 "application " | TOPICAL_OINTMENT | Freq: Two times a day (BID) | CUTANEOUS | 2 refills | Status: DC | PRN

## 2021-05-26 NOTE — Assessment & Plan Note (Signed)
Perennial rhinoconjunctivitis symptoms for 25+ years with worsening in the spring and fall.  Tried Zyrtec and Nasacort with some benefit.  Skin testing over 10 years ago showed multiple positives per patient report.  No prior AIT.  Today's skin testing showed: Positive to grass, weed, ragweed, trees, mold, dust mites, cat, horse, dog.   Start environmental control measures as below.  Start Singulair (montelukast) 10mg  daily at night.  Use over the counter antihistamines such as Zyrtec (cetirizine), Claritin (loratadine), Allegra (fexofenadine), or Xyzal (levocetirizine) daily as needed. May take twice a day during allergy flares. May switch antihistamines every few months.  Use Flonase (fluticasone) nasal spray 1 spray per nostril twice a day as needed for nasal congestion.   Nasal saline spray (i.e., Simply Saline) or nasal saline lavage (i.e., NeilMed) is recommended as needed and prior to medicated nasal sprays.  Consider allergy injections for long term control if above medications do not help the symptoms - handout given.

## 2021-05-26 NOTE — Assessment & Plan Note (Signed)
Fresh fruits/vegetables cause perioral symptoms - okay with cooked forms. Tree nuts and most recently peanuts causing perioral pruritus and wheezing. Avocados cause perioral pruritus and lip swelling.   Today's skin testing showed: Positive to peanuts, hazelnut and borderline to soy. Negative to avocado.  Continue to avoid foods that bother you - fresh fruits/vegetables, avocados, peanuts, tree nuts.   I have prescribed epinephrine injectable and demonstrated proper use. For mild symptoms you can take over the counter antihistamines such as Benadryl and monitor symptoms closely. If symptoms worsen or if you have severe symptoms including breathing issues, throat closure, significant swelling, whole body hives, severe diarrhea and vomiting, lightheadedness then inject epinephrine and seek immediate medical care afterwards.  Action plan given.  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 This is caused by cross reactivity of pollen with fresh fruits and vegetables, and nuts. Symptoms are usually localized in the form of itching and burning in mouth and throat. Very rarely it can progress to more severe symptoms. Eating foods in cooked or processed forms usually minimizes symptoms. I recommended avoidance of eating the problem foods, especially during the peak season(s). A list of common pollens and food cross-reactivities was provided to the patient.

## 2021-05-26 NOTE — Patient Instructions (Addendum)
Today's skin testing showed: Positive to grass, weed, ragweed, trees, mold, dust mites, cat, horse, dog.  Positive to peanuts, hazelnut and borderline to soy. Negative to avocado.  Environmental allergies  Start environmental control measures as below.  Start Singulair (montelukast) 10mg  daily at night.  Use over the counter antihistamines such as Zyrtec (cetirizine), Claritin (loratadine), Allegra (fexofenadine), or Xyzal (levocetirizine) daily as needed. May take twice a day during allergy flares. May switch antihistamines every few months.  Use Flonase (fluticasone) nasal spray 1 spray per nostril twice a day as needed for nasal congestion.   Nasal saline spray (i.e., Simply Saline) or nasal saline lavage (i.e., NeilMed) is recommended as needed and prior to medicated nasal sprays.  Consider allergy injections for long term control if above medications do not help the symptoms - handout given.   Asthma: . Daily controller medication(s):  Start Singulair (montelukast) 10mg  daily at night. Cautioned that in some children/adults can experience behavioral changes including hyperactivity, agitation, depression, sleep disturbances and suicidal ideations. These side effects are rare, but if you notice them you should notify me and discontinue Singulair (montelukast). . May use albuterol rescue inhaler 2 puffs every 4 to 6 hours as needed for shortness of breath, chest tightness, coughing, and wheezing. May use albuterol rescue inhaler 2 puffs 5 to 15 minutes prior to strenuous physical activities. Monitor frequency of use.  . Asthma control goals:  o Full participation in all desired activities (may need albuterol before activity) o Albuterol use two times or less a week on average (not counting use with activity) o Cough interfering with sleep two times or less a month o Oral steroids no more than once a year o No hospitalizations  Rash - dyshidrotic eczema.  See below for proper skin  care. Use Eucrisa (crisaborole) 2% ointment twice a day on mild eczema flares on the face and body. This is a non-steroid ointment. Samples given. If it burns, place the medication in the refrigerator.  Apply a thin layer of moisturizer and then apply the Eucrisa on top of it.  Read about Dupixent injections.  Food:  Continue to avoid foods that bother you - fresh fruits/vegetables, avocados, peanuts, tree nuts.   I have prescribed epinephrine injectable and demonstrated proper use. For mild symptoms you can take over the counter antihistamines such as Benadryl and monitor symptoms closely. If symptoms worsen or if you have severe symptoms including breathing issues, throat closure, significant swelling, whole body hives, severe diarrhea and vomiting, lightheadedness then inject epinephrine and seek immediate medical care afterwards.  Action plan given.   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 This is caused by cross reactivity of pollen with fresh fruits and vegetables, and nuts. Symptoms are usually localized in the form of itching and burning in mouth and throat. Very rarely it can progress to more severe symptoms. Eating foods in cooked or processed forms usually minimizes symptoms. I recommended avoidance of eating the problem foods, especially during the peak season(s). A list of common pollens and food cross-reactivities was provided to the patient.   Follow up in 2 months or sooner if needed.   Skin care recommendations  Bath time: . Always use lukewarm water. AVOID very hot or cold water. Marland Kitchen Keep bathing time to 5-10 minutes. . Do NOT use bubble bath. . Use a mild soap and use just enough to wash the dirty areas. . Do NOT scrub skin vigorously.  Marland Kitchen  After bathing, pat dry your skin with a towel. Do NOT rub or scrub the skin.  Moisturizers and prescriptions:  . ALWAYS apply moisturizers immediately after  bathing (within 3 minutes). This helps to lock-in moisture. . Use the moisturizer several times a day over the whole body. Kermit Balo summer moisturizers include: Aveeno, CeraVe, Cetaphil. Kermit Balo winter moisturizers include: Aquaphor, Vaseline, Cerave, Cetaphil, Eucerin, Vanicream. . When using moisturizers along with medications, the moisturizer should be applied about one hour after applying the medication to prevent diluting effect of the medication or moisturize around where you applied the medications. When not using medications, the moisturizer can be continued twice daily as maintenance.  Laundry and clothing: . Avoid laundry products with added color or perfumes. . Use unscented hypo-allergenic laundry products such as Tide free, Cheer free & gentle, and All free and clear.  . If the skin still seems dry or sensitive, you can try double-rinsing the clothes. . Avoid tight or scratchy clothing such as wool. . Do not use fabric softeners or dyer sheets.  Reducing Pollen Exposure . Pollen seasons: trees (spring), grass (summer) and ragweed/weeds (fall). Marland Kitchen Keep windows closed in your home and car to lower pollen exposure.  Susa Simmonds air conditioning in the bedroom and throughout the house if possible.  . Avoid going out in dry windy days - especially early morning. . Pollen counts are highest between 5 - 10 AM and on dry, hot and windy days.  . Save outside activities for late afternoon or after a heavy rain, when pollen levels are lower.  . Avoid mowing of grass if you have grass pollen allergy. Marland Kitchen Be aware that pollen can also be transported indoors on people and pets.  . Dry your clothes in an automatic dryer rather than hanging them outside where they might collect pollen.  . Rinse hair and eyes before bedtime. Mold Control . Mold and fungi can grow on a variety of surfaces provided certain temperature and moisture conditions exist.  . Outdoor molds grow on plants, decaying vegetation and  soil. The major outdoor mold, Alternaria and Cladosporium, are found in very high numbers during hot and dry conditions. Generally, a late summer - fall peak is seen for common outdoor fungal spores. Rain will temporarily lower outdoor mold spore count, but counts rise rapidly when the rainy period ends. . The most important indoor molds are Aspergillus and Penicillium. Dark, humid and poorly ventilated basements are ideal sites for mold growth. The next most common sites of mold growth are the bathroom and the kitchen. Outdoor (Seasonal) Mold Control . Use air conditioning and keep windows closed. . Avoid exposure to decaying vegetation. Marland Kitchen Avoid leaf raking. . Avoid grain handling. . Consider wearing a face mask if working in moldy areas.  Indoor (Perennial) Mold Control  . Maintain humidity below 50%. . Get rid of mold growth on hard surfaces with water, detergent and, if necessary, 5% bleach (do not mix with other cleaners). Then dry the area completely. If mold covers an area more than 10 square feet, consider hiring an indoor environmental professional. . For clothing, washing with soap and water is best. If moldy items cannot be cleaned and dried, throw them away. . Remove sources e.g. contaminated carpets. . Repair and seal leaking roofs or pipes. Using dehumidifiers in damp basements may be helpful, but empty the water and clean units regularly to prevent mildew from forming. All rooms, especially basements, bathrooms and kitchens, require ventilation and cleaning to  deter mold and mildew growth. Avoid carpeting on concrete or damp floors, and storing items in damp areas. Control of House Dust Mite Allergen . Dust mite allergens are a common trigger of allergy and asthma symptoms. While they can be found throughout the house, these microscopic creatures thrive in warm, humid environments such as bedding, upholstered furniture and carpeting. . Because so much time is spent in the bedroom, it  is essential to reduce mite levels there.  . Encase pillows, mattresses, and box springs in special allergen-proof fabric covers or airtight, zippered plastic covers.  . Bedding should be washed weekly in hot water (130 F) and dried in a hot dryer. Allergen-proof covers are available for comforters and pillows that can't be regularly washed.  Wendee Copp the allergy-proof covers every few months. Minimize clutter in the bedroom. Keep pets out of the bedroom.  Marland Kitchen Keep humidity less than 50% by using a dehumidifier or air conditioning. You can buy a humidity measuring device called a hygrometer to monitor this.  . If possible, replace carpets with hardwood, linoleum, or washable area rugs. If that's not possible, vacuum frequently with a vacuum that has a HEPA filter. . Remove all upholstered furniture and non-washable window drapes from the bedroom. . Remove all non-washable stuffed toys from the bedroom.  Wash stuffed toys weekly. Pet Allergen Avoidance: . Contrary to popular opinion, there are no "hypoallergenic" breeds of dogs or cats. That is because people are not allergic to an animal's hair, but to an allergen found in the animal's saliva, dander (dead skin flakes) or urine. Pet allergy symptoms typically occur within minutes. For some people, symptoms can build up and become most severe 8 to 12 hours after contact with the animal. People with severe allergies can experience reactions in public places if dander has been transported on the pet owners' clothing. Marland Kitchen Keeping an animal outdoors is only a partial solution, since homes with pets in the yard still have higher concentrations of animal allergens. . Before getting a pet, ask your allergist to determine if you are allergic to animals. If your pet is already considered part of your family, try to minimize contact and keep the pet out of the bedroom and other rooms where you spend a great deal of time. . As with dust mites, vacuum carpets often or  replace carpet with a hardwood floor, tile or linoleum. . High-efficiency particulate air (HEPA) cleaners can reduce allergen levels over time. . While dander and saliva are the source of cat and dog allergens, urine is the source of allergens from rabbits, hamsters, mice and [place] pigs; so ask a non-allergic family member to clean the animal's cage. . If you have a pet allergy, talk to your allergist about the potential for allergy immunotherapy (allergy shots). This strategy can often provide long-term relief.

## 2021-05-26 NOTE — Assessment & Plan Note (Addendum)
Diagnosed with asthma over 25+ years ago.  Main triggers are food and environmental allergies.  Tried Advair in the past.  Uses albuterol few times per week with good benefit.  2022 normal chest x-ray.  Had COVID-19.  Today's spirometry was normal with 4% improvement in FEV1 post bronchodilator treatment.  Clinically feeling unchanged. . Daily controller medication(s):  Start Singulair (montelukast) 10mg  daily at night. Cautioned that in some children/adults can experience behavioral changes including hyperactivity, agitation, depression, sleep disturbances and suicidal ideations. These side effects are rare, but if you notice them you should notify me and discontinue Singulair (montelukast). . May use albuterol rescue inhaler 2 puffs every 4 to 6 hours as needed for shortness of breath, chest tightness, coughing, and wheezing. May use albuterol rescue inhaler 2 puffs 5 to 15 minutes prior to strenuous physical activities. Monitor frequency of use.  . Get spirometry at next visit. . If no improvement, will add on daily ICS inhaler next.

## 2021-05-26 NOTE — Assessment & Plan Note (Signed)
.   See assessment and plan as above. 

## 2021-05-26 NOTE — Progress Notes (Signed)
New Patient Note  RE: Kendra Patton MRN: [id number] DOB: 05-11-1986 Date of Office Visit: 05/26/2021  Consult requested by: No ref. provider found Primary care provider: Allwardt, Randa Evens, PA-C  Chief Complaint: Eczema (hands), Allergies (Year round allergies, sneezing, sinus headaches, has oral allergy syndrome, possible gluten allergy, allergy testing 12 years ago, peanut and tree nuts causes itchy tongue), and Asthma (Has had since 51 or 35 years old)  History of Present Illness: I had the pleasure of seeing Kendra Patton for initial evaluation at [facility]  on 05/26/2021. She is a 35 y.o. female, who is self-referred here for the evaluation of allergic rhinitis, asthma and eczema.  Rhinitis: She reports symptoms of sneezing, headaches, nasal congestion, rhinorrhea, itchy ears/mouth, sneezing, itchy/watery eyes. Symptoms have been going on for 25+ years. The symptoms are present all year around with worsening in spring and fall. Other triggers include exposure to unknown. Anosmia: no. Headache: yes. She has used zyrtec, Nasacort with some improvement in symptoms. Sinus infections: 1 within the past 2 years. Previous work up includes: 12 years ago showed multiple positives per patient report. No prior AIT.  Previous ENT evaluation: not recently. Previous sinus imaging: no. History of nasal polyps: no. Last eye exam: 1 year ago. History of reflux: yes and takes meds prn.  Asthma: She reports symptoms of chest tightness, shortness of breath, coughing, wheezing for 25+ years. Current medications include albuterol prn which help. She reports not using aerochamber with inhalers. She tried the following inhalers: Advair and was on singulair as a child with good benefit. Main triggers are allergies - environmental/food. In the last month, frequency of symptoms: few times per week. Frequency of nocturnal symptoms: 0x/month. Frequency of SABA use: few times per week  especially when outdoors. Interference with physical activity: no. Sleep is undisturbed. In the last 12 months, emergency room visits/urgent care visits/doctor office visits or hospitalizations due to respiratory issues: 1. In the last 12 months, oral steroids courses: one. Lifetime history of hospitalization for respiratory issues: yes as a child. Prior intubations: no. History of pneumonia: yes. She was evaluated by allergist in the past. Smoking exposure: vapes. Up to date with flu vaccine: no. Up to date with COVID-19 vaccine: no. Prior Covid-19 infection: yes.  05/08/2021 CXR No active cardiopulmonary disease.  Rash: Rash started about 20+ years ago. Mainly occurs on her hands. Describes them as itchy, red, dry, blisters. Sometimes handling raw vegetables and uncooked dough makes it worse. She has tried the following therapies: topical steroids creams with some benefit.   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  The reaction occurred as a teenager, after she ate small amount of tree nuts. Symptoms started within minutes and was in the form of perioral pruritus, blistering in the mouth and wheezing.   Recently noticed similar symptoms as above with peanuts.  Raw vegetables - broccoli, cauliflower, squash, cucumber cause perioral pruritus. No issues with cooked forms.  Raw fruits cause perioral pruritus.  Avocados cause bumps on the lips, perioral pruritus and lip swelling.   Noticed some itching of the hands when touching raw dough - tolerates cooked gluten with no issues.    She was not evaluated in ED. She does not have access to epinephrine autoinjector and not needed to use it.   Past work up includes: 2012 skin testing showed multiple positives per patient report.  Dietary History: patient has been eating other foods including milk, eggs, sesame, shellfish, fish,  soy, wheat, meats, fruits and vegetables.  She reports reading labels and avoiding peanuts, tree nuts, avocados, fresh  vegetables in diet completely.   Assessment and Plan: Kendra Patton is a 35 y.o. female with: Other allergic rhinitis Perennial rhinoconjunctivitis symptoms for 25+ years with worsening in the spring and fall.  Tried Zyrtec and Nasacort with some benefit.  Skin testing over 10 years ago showed multiple positives per patient report.  No prior AIT.  Today's skin testing showed: Positive to grass, weed, ragweed, trees, mold, dust mites, cat, horse, dog.   Start environmental control measures as below.  Start Singulair (montelukast) 10mg  daily at night.  Use over the counter antihistamines such as Zyrtec (cetirizine), Claritin (loratadine), Allegra (fexofenadine), or Xyzal (levocetirizine) daily as needed. May take twice a day during allergy flares. May switch antihistamines every few months.  Use Flonase (fluticasone) nasal spray 1 spray per nostril twice a day as needed for nasal congestion.   Nasal saline spray (i.e., Simply Saline) or nasal saline lavage (i.e., NeilMed) is recommended as needed and prior to medicated nasal sprays.  Consider allergy injections for long term control if above medications do not help the symptoms - handout given.   Allergic conjunctivitis of both eyes  See assessment and plan as above.  Asthma Diagnosed with asthma over 25+ years ago.  Main triggers are food and environmental allergies.  Tried Advair in the past.  Uses albuterol few times per week with good benefit.  2022 normal chest x-ray.  Had COVID-19.  Today's spirometry was normal with 4% improvement in FEV1 post bronchodilator treatment.  Clinically feeling unchanged. . Daily controller medication(s):  Start Singulair (montelukast) 10mg  daily at night. Cautioned that in some children/adults can experience behavioral changes including hyperactivity, agitation, depression, sleep disturbances and suicidal ideations. These side effects are rare, but if you notice them you should notify me and discontinue  Singulair (montelukast). . May use albuterol rescue inhaler 2 puffs every 4 to 6 hours as needed for shortness of breath, chest tightness, coughing, and wheezing. May use albuterol rescue inhaler 2 puffs 5 to 15 minutes prior to strenuous physical activities. Monitor frequency of use.  . Get spirometry at next visit. . If no improvement, will add on daily ICS inhaler next.   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 Worse after handling raw vegetables and uncooked dough. Using topical steroid creams with some benefit.  See below for proper skin care. Use Eucrisa (crisaborole) 2% ointment twice a day on mild eczema flares on the face and body. This is a non-steroid ointment. Samples given. If it burns, place the medication in the refrigerator.  Apply a thin layer of moisturizer and then apply the Eucrisa on top of it.  If no improvement, consider Dupixent injections - handout given.  Other adverse food reactions, not elsewhere classified, subsequent encounter Fresh fruits/vegetables cause perioral symptoms - okay with cooked forms. Tree nuts and most recently peanuts causing perioral pruritus and wheezing. Avocados cause perioral pruritus and lip swelling.   Today's skin testing showed: Positive to peanuts, hazelnut and borderline to soy. Negative to avocado.  Continue to avoid foods that bother you - fresh fruits/vegetables, avocados, peanuts, tree nuts.   I have prescribed epinephrine injectable and demonstrated proper use. For mild symptoms you can take over the counter antihistamines such as Benadryl and monitor symptoms closely. If symptoms worsen or if you have severe symptoms including breathing issues, throat closure, significant swelling, whole body hives, severe diarrhea and vomiting, lightheadedness then  inject epinephrine and seek immediate medical care afterwards.  Action plan given.  Discussed that her food triggered oral and throat symptoms are likely caused by oral food  allergy syndrome (OFAS). This is caused by cross reactivity of pollen with fresh fruits and vegetables, and nuts. Symptoms are usually localized in the form of itching and burning in mouth and throat. Very rarely it can progress to more severe symptoms. Eating foods in cooked or processed forms usually minimizes symptoms. I recommended avoidance of eating the problem foods, especially during the peak season(s). A list of common pollens and food cross-reactivities was provided to the patient.   Oral allergy syndrome, subsequent encounter  See assessment and plan as above.  Return in about 2 months (around 07/26/2021).  Meds ordered this encounter  Medications  . cetirizine (ZYRTEC ALLERGY) 10 MG tablet    Sig: Take 1 tablet (10 mg total) by mouth daily.    Dispense:  30 tablet    Refill:  5  . fluticasone (FLONASE) 50 MCG/ACT nasal spray    Sig: Place 1 spray into both nostrils 2 (two) times daily as needed for allergies or rhinitis.    Dispense:  16 g    Refill:  5  . montelukast (SINGULAIR) 10 MG tablet    Sig: Take 1 tablet (10 mg total) by mouth at bedtime.    Dispense:  30 tablet    Refill:  5  . albuterol (VENTOLIN HFA) 108 (90 Base) MCG/ACT inhaler    Sig: Inhale 2 puffs into the lungs every 4 (four) hours as needed for wheezing or shortness of breath (coughing fits).    Dispense:  18 g    Refill:  1  . EPINEPHrine (AUVI-Q) 0.3 mg/0.3 mL IJ SOAJ injection    Sig: Inject 0.3 mg into the muscle as needed for anaphylaxis.    Dispense:  1 each    Refill:  1  . Crisaborole (EUCRISA) 2 % OINT    Sig: Apply 1 application topically 2 (two) times daily as needed (rash on hands).    Dispense:  100 g    Refill:  2   Lab Orders  No laboratory test(s) ordered today    Other allergy screening: Medication allergy: yes Hymenoptera allergy: no History of recurrent infections suggestive of immunodeficency: no  Diagnostics: Spirometry:  Tracings reviewed. Her effort: Good reproducible  efforts. FVC: 4.65L FEV1: 3.34L, 98% predicted FEV1/FVC ratio: 72% Interpretation: Spirometry consistent with normal pattern with 4% improvement in FEV1 post bronchodilator treatment. Clinically feeling unchanged.   Please see scanned spirometry results for details.  Skin Testing: Environmental allergy panel and select foods. Positive to grass, weed, ragweed, trees, mold, dust mites, cat, horse, dog.  Positive to peanuts, hazelnut and borderline to soy. Negative to avocado. Results discussed with patient/family.  Airborne Adult Perc - 05/26/21 1505    Time Antigen Placed 1505    Allergen Manufacturer Lavella Hammock    Location Back    Number of Test 59    1. Control-Buffer 50% Glycerol Negative    2. Control-Histamine 1 mg/ml 2+    3. Albumin saline Negative    4. [place] 3+    5. [place] Negative    6. [place] 3+    7. [place] --   +/-   8. Meadow Fescue Negative    9. Perennial Rye Negative    10. Sweet Vernal Negative    11. Timothy Negative    12. Cocklebur 2+    13. Burweed  Marshelder 2+    14. Ragweed, short 4+    15. Ragweed, Giant 2+    16. Plantain,  English 3+    17. Lamb's Quarters Negative    18. Sheep Sorrell 3+    19. Rough Pigweed 3+    20. Marsh Elder, Rough 2+    21. Mugwort, Common 3+    22. Ash mix 2+    23. Birch mix 4+    24. Beech American 3+    25. Box, Elder 3+    26. Cedar, red 4+    27. Cottonwood, Eastern 3+    28. Elm mix 3+    29. Hickory 4+    30. Maple mix --   +/-   31. Oak, [place] mix 4+    32. Pecan Pollen 3+    33. Pine mix 2+    34. Sycamore Eastern 2+    35. [place], Black Pollen 3+    36. Alternaria alternata --   +/-   37. Cladosporium Herbarum 2+    38. Aspergillus mix 2+    39. Penicillium mix 2+    40. Bipolaris sorokiniana (Helminthosporium) Negative    41. Drechslera spicifera (Curvularia) 2+    42. Mucor plumbeus Negative    43. Fusarium moniliforme 3+    44. Aureobasidium pullulans (pullulara) Negative    45.  Rhizopus oryzae Negative    46. Botrytis cinera Negative    47. Epicoccum nigrum 2+    48. Phoma betae 2+    49. Candida Albicans Negative    50. Trichophyton mentagrophytes Negative    51. Mite, D Farinae  5,000 AU/ml 4+    52. Mite, D Pteronyssinus  5,000 AU/ml 4+    53. Cat Hair 10,000 BAU/ml 2+    54.  Dog Epithelia Negative    55. Mixed Feathers Negative    56. Horse Epithelia 3+    57. Cockroach, German Negative    58. Mouse Negative    59. Tobacco Leaf Negative          Intradermal - 05/26/21 1550    Time Antigen Placed 1550    Allergen Manufacturer Other    Location Arm    Number of Test 4    Control Negative    [place] 3+    Dog 3+    Cockroach Negative          Food Adult Perc - 05/26/21 1500    Time Antigen Placed 1506    Allergen Manufacturer Lavella Hammock    Location Back    Number of allergen test 19    1. Peanut --   5 x 4   2. Soybean --   3 x 3   3. Wheat Negative    4. Sesame Negative    5. Milk, cow Negative    6. Egg White, Chicken Negative    7. Casein Negative    8. Shellfish Mix Negative    9. Fish Mix Negative    10. Cashew Negative    11. Pecan Food Negative    12. [place] Negative    13. Almond Negative    14. Hazelnut --   6 x 4   15. [place] nut Negative    16. Coconut Negative    17. Pistachio Negative    48. Avocado Negative    5. Rat Negative           Past Medical History: Patient Active Problem List   Diagnosis Date Noted  .  Other allergic rhinitis 05/26/2021  . Allergic conjunctivitis of both eyes 05/26/2021  . Other adverse food reactions, not elsewhere classified, subsequent encounter 05/26/2021  . Oral allergy syndrome, subsequent encounter 05/26/2021  . Dyshidrotic eczema 05/26/2021  . 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 Polycystic ovary syndrome 12/24/2019  . Vaginal delivery 02/29/2016  . Anxiety - dx'd at age 66 02/28/2016  . Eczema 02/28/2016  . IBS (irritable bowel syndrome)  02/28/2016  . Scoliosis 02/28/2016  . Seasonal allergies 02/28/2016  . Allergy history, drug -- Compazine (dystonic rxn; seizures) 02/27/2016  . High-risk pregnancy supervision 10/27/2015  . Von Willebrand disease, type I ([place]) 07/11/2015  . 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 Asthma 07/11/2015   Past Medical History:  Diagnosis Date  . Anxiety    age 54  . Asthma   . COVID 04/2021  . Depression   . Eczema   . High-risk pregnancy supervision 10/27/2015  . 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 History of OCD (obsessive compulsive disorder)   . IBS (irritable bowel syndrome)   . Scoliosis   . Von Willebrand disease ([place])    Past Surgical History: Past Surgical History:  Procedure Laterality Date  . WISDOM TOOTH EXTRACTION     Medication List:  Current Outpatient Medications  Medication Sig Dispense Refill  . albuterol (VENTOLIN HFA) 108 (90 Base) MCG/ACT inhaler Inhale 2 puffs into the lungs every 4 (four) hours as needed for wheezing or shortness of breath (coughing fits). 18 g 1  . amphetamine-dextroamphetamine (ADDERALL XR) 30 MG 24 hr capsule Take 1 capsule (30 mg total) by mouth every morning. 30 capsule 0  . buPROPion (WELLBUTRIN XL) 300 MG 24 hr tablet Take 1 tablet (300 mg total) by mouth daily. 90 tablet 0  . cetirizine (ZYRTEC ALLERGY) 10 MG tablet Take 1 tablet (10 mg total) by mouth daily. 30 tablet 5  . clonazePAM (KLONOPIN) 0.5 MG tablet Take 0.5 tablets (0.25 mg total) by mouth 2 (two) times daily as needed for anxiety. 30 tablet 0  . Crisaborole (EUCRISA) 2 % OINT Apply 1 application topically 2 (two) times daily as needed (rash on hands). 100 g 2  . EPINEPHrine (AUVI-Q) 0.3 mg/0.3 mL IJ SOAJ injection Inject 0.3 mg into the muscle as needed for anaphylaxis. 1 each 1  . ferrous sulfate 325 (65 FE) MG EC tablet Take 1 tablet (325 mg total) by mouth daily with breakfast. 90 tablet 3  . fluticasone (FLONASE) 50 MCG/ACT nasal spray Place 1 spray into both nostrils 2 (two)  times daily as needed for allergies or rhinitis. 16 g 5  . metroNIDAZOLE (METROGEL) 0.75 % gel Apply one application twice per week to prevent BV recurrence 45 g 1  . montelukast (SINGULAIR) 10 MG tablet Take 1 tablet (10 mg total) by mouth at bedtime. 30 tablet 5  . spironolactone (ALDACTONE) 25 MG tablet Take 50 mg by mouth daily.    . Vitamin D, Ergocalciferol, (DRISDOL) 1.25 MG (50000 UNIT) CAPS capsule Take once capsule by mouth once a week for 12 weeks 5 capsule 3   No current facility-administered medications for this visit.   Allergies: Allergies  Allergen Reactions  . Aspirin Other (See Comments)    "Thins bloods too much"  . Compazine [Prochlorperazine Edisylate]     Dystonic.  Marland Kitchen Other     Compazine   Social History: Social History   Socioeconomic History  . Marital status: Married    Spouse name: Not  on file  . Number of children: 2  . Years of education: Not on file  . Highest education level: Bachelor's degree (e.g., BA, AB, BS)  Occupational History    Comment: home maker  Tobacco Use  . Smoking status: Former Smoker    Types: Cigarettes    Quit date: 2010    Years since quitting: 12.4  . Smokeless tobacco: Never Used  . Tobacco comment: on and off socially   Vaping Use  . Vaping Use: Never used  Substance and Sexual Activity  . Alcohol use: Yes    Alcohol/week: 0.0 standard drinks    Comment: 2 seltzer beers per day  . Drug use: No  . Sexual activity: Yes    Birth control/protection: None  Other Topics Concern  . Not on file  Social History Narrative   Lives with family   Social Determinants of Health   Financial Resource Strain: Not on file  Food Insecurity: Not on file  Transportation Needs: Not on file  Physical Activity: Not on file  Stress: Not on file  Social Connections: Not on file   Lives in a house built in 1944. Smoking: vapes Occupation: stay at home  Environmental History: Water Damage/mildew in the house: yes Carpet in the  family room: yes Carpet in the bedroom: yes Heating: gas Cooling: window Pet: yes 1 dog x 14 yrs, 5 rats x 1 yr, 3 cats x 5 yrs  Family History: Family History  Problem Relation Age of Onset  . Asthma Mother   . Hypertension Mother   . Cancer Mother        Type of Skin Cancer  . Diabetes Mother   . Depression Mother   . Heart disease Mother   . Varicose Veins Mother   . Multiple sclerosis Mother   . Cancer Father        Skin  . Deep vein thrombosis Father   . Asthma Sister   . Hypertension Sister   . Depression Sister   . Learning disabilities Sister   . Varicose Veins Sister   . Bronchitis Sister   . Cancer Maternal Grandmother   . Cancer Paternal Grandfather        lung  . Asthma Sister   . Depression Sister   . Bronchitis Sister   . Deafness Daughter   . Eczema Niece   . Food Allergy Niece   . Bronchitis Niece   . Eczema Nephew   . Food Allergy Nephew   . Bronchitis Nephew   . Urticaria Neg Hx   . Atopy Neg Hx   . Angioedema Neg Hx    Review of Systems  Constitutional: Negative for appetite change, chills, fever and unexpected weight change.  HENT: Positive for congestion, rhinorrhea and sneezing.   Eyes: Positive for itching.  Respiratory: Positive for cough, chest tightness, shortness of breath and wheezing.   Cardiovascular: Negative for chest pain.  Gastrointestinal: Negative for abdominal pain.  Genitourinary: Negative for difficulty urinating.  Skin: Positive for rash.  Allergic/Immunologic: Positive for environmental allergies and food allergies.  Neurological: Positive for headaches.   Objective: BP 120/84   Pulse (!) 115   Temp (!) 97.3 F (36.3 C) (Temporal)   Resp 20   Ht 5\' 7"  (1.702 m)   Wt 181 lb (82.1 kg)   SpO2 97%   BMI 28.35 kg/m  Body mass index is 28.35 kg/m. Physical Exam Vitals and nursing note reviewed.  Constitutional:      Appearance:  Normal appearance. She is well-developed.  HENT:     Head: Normocephalic and  atraumatic.     Right Ear: Tympanic membrane and external ear normal.     Left Ear: Tympanic membrane and external ear normal.     Nose: Nose normal.     Mouth/Throat:     Mouth: Mucous membranes are moist.     Pharynx: Oropharynx is clear.  Eyes:     Conjunctiva/sclera: Conjunctivae normal.  Cardiovascular:     Rate and Rhythm: Normal rate and regular rhythm.     Heart sounds: Normal heart sounds. No murmur heard. No friction rub. No gallop.   Pulmonary:     Effort: Pulmonary effort is normal.     Breath sounds: Normal breath sounds. No wheezing, rhonchi or rales.  Musculoskeletal:     Cervical back: Neck supple.  Skin:    General: Skin is warm and dry.     Findings: Rash present.     Comments: Dry, cracked skin on the hands b/l.  Neurological:     Mental Status: She is alert and oriented to person, place, and time.  Psychiatric:        Behavior: Behavior normal.    The plan was reviewed with the patient/family, and all questions/concerned were addressed.  It was my pleasure to see Emmanuela today and participate in her care. Please feel free to contact me with any questions or concerns.  Sincerely,  Rexene Alberts, DO Allergy & Immunology  [facility] office: [phone] [place] office: [phone]  I have spent a total of 65 minutes of face-to-face and non-face-to-face time (excluding clinical staff time) preparing to see patient, ordering tests and/or medications, and counseling the patient on allergic rhinoconjunctivitis, asthma, dyshidrotic eczema, oral allergy syndrome, food allergies.

## 2021-05-26 NOTE — Assessment & Plan Note (Signed)
On hands for 20+ years. Worse after handling raw vegetables and uncooked dough. Using topical steroid creams with some benefit.  See below for proper skin care. Use Eucrisa (crisaborole) 2% ointment twice a day on mild eczema flares on the face and body. This is a non-steroid ointment. Samples given. If it burns, place the medication in the refrigerator.  Apply a thin layer of moisturizer and then apply the Eucrisa on top of it.  If no improvement, consider Dupixent injections - handout given.

## 2021-05-27 ENCOUNTER — Ambulatory Visit (INDEPENDENT_AMBULATORY_CARE_PROVIDER_SITE_OTHER): Payer: BC Managed Care – PPO | Admitting: Neurology

## 2021-05-27 ENCOUNTER — Telehealth: Payer: Self-pay | Admitting: Neurology

## 2021-05-27 DIAGNOSIS — G253 Myoclonus: Secondary | ICD-10-CM

## 2021-05-27 DIAGNOSIS — R413 Other amnesia: Secondary | ICD-10-CM

## 2021-05-27 DIAGNOSIS — R410 Disorientation, unspecified: Secondary | ICD-10-CM

## 2021-05-27 NOTE — Procedures (Signed)
    History:  Kendra Patton is a 35 year old patient with a history of attention deficit disorder.  She went to the emergency room on 09 May 2021 with an episode of feeling hot and short winded.  The patient had tingling sensations throughout the body and felt heavy on the left side of the face.  The patient's had some problems with jerks and twitches of the arms and legs.  She is being evaluated for the above event.  This is a routine EEG.  No skull defects are noted.  Medications include Ventolin, Adderall, Tessalon, Wellbutrin, clonazepam, ferrous sulfate, Aldactone, and vitamin D.  EEG classification: Normal awake  Description of the recording: The background rhythms of this recording consists of a fairly well modulated medium amplitude alpha rhythm of 10 Hz that is reactive to eye opening and closure. As the record progresses, the patient appears to remain in the waking state throughout the recording. Photic stimulation was performed, resulting in a bilateral and symmetric photic driving response. Hyperventilation was also performed, resulting in a minimal buildup of the background rhythm activities without significant slowing seen. At no time during the recording does there appear to be evidence of spike or spike wave discharges or evidence of focal slowing. EKG monitor shows no evidence of cardiac rhythm abnormalities with a heart rate of 84.  Impression: This is a normal EEG recording in the waking state. No evidence of ictal or interictal discharges are seen.

## 2021-05-27 NOTE — Telephone Encounter (Signed)
MRI brain w/wo contrast bcbs auth: [id number] (05/27/21-07/25/21)  Sent to GI for scheduling

## 2021-05-28 ENCOUNTER — Encounter: Payer: Self-pay | Admitting: Emergency Medicine

## 2021-05-29 ENCOUNTER — Telehealth: Payer: Self-pay

## 2021-05-29 DIAGNOSIS — F988 Other specified behavioral and emotional disorders with onset usually occurring in childhood and adolescence: Secondary | ICD-10-CM

## 2021-05-29 MED ORDER — AMPHETAMINE-DEXTROAMPHET ER 30 MG PO CP24: 30.0000 mg | ORAL_CAPSULE | ORAL | 0 refills | Status: DC

## 2021-05-29 NOTE — Telephone Encounter (Signed)
.   LAST APPOINTMENT DATE: 05/29/2021   NEXT APPOINTMENT DATE:@Visit  date not found  MEDICATION:amphetamine-dextroamphetamine (ADDERALL XR) 30 MG 24 hr capsule  PHARMACY:[id number] - [place], [place] - [address]

## 2021-05-29 NOTE — Telephone Encounter (Signed)
Pt requesting refill on Adderall XR 30 mg. Last OV 03/30/21 with Alyssa.

## 2021-05-29 NOTE — Telephone Encounter (Signed)
Error

## 2021-05-30 ENCOUNTER — Other Ambulatory Visit: Payer: Self-pay

## 2021-05-30 ENCOUNTER — Ambulatory Visit
Admission: RE | Admit: 2021-05-30 | Discharge: 2021-05-30 | Disposition: A | Discharge status: Home / Self Care | Payer: BC Managed Care – PPO | Source: Ambulatory Visit | Attending: Neurology | Admitting: Neurology

## 2021-05-30 DIAGNOSIS — R413 Other amnesia: Secondary | ICD-10-CM

## 2021-05-30 DIAGNOSIS — G253 Myoclonus: Secondary | ICD-10-CM

## 2021-05-30 MED ORDER — GADOBENATE DIMEGLUMINE 529 MG/ML IV SOLN
17.0000 mL | Freq: Once | INTRAVENOUS | Status: AC | PRN
  Administered 2021-05-30: 17 mL via INTRAVENOUS

## 2021-06-01 ENCOUNTER — Telehealth: Payer: Self-pay | Admitting: Neurology

## 2021-06-01 MED ORDER — AMPHETAMINE-DEXTROAMPHET ER 30 MG PO CP24: 30.0000 mg | ORAL_CAPSULE | ORAL | 0 refills | Status: DC

## 2021-06-01 NOTE — Telephone Encounter (Signed)
I was unable to reach patient, she is not answering her phone, cannot leave a message.  MRI of the brain was normal, suspect the myoclonus issues are related to medications, we have recommended that she come off of the Wellbutrin, reduce Adderall dose, and reduce caffeine intake.    MRI brain 05/31/21:  IMPRESSION: Normal MRI of the brain.

## 2021-06-01 NOTE — Telephone Encounter (Signed)
The patient did call back later, I discussed the MRI of the brain with her.  The patient indicates that the twitching would occur only when she took Mucinex, avoiding Mucinex has improved the issue.

## 2021-06-01 NOTE — Addendum Note (Signed)
Addended by: Dutch Quint B on: 06/01/2021 01:15 PM   Modules accepted: Orders

## 2021-07-28 ENCOUNTER — Ambulatory Visit: Payer: BC Managed Care – PPO | Admitting: Allergy

## 2021-07-28 NOTE — Progress Notes (Deleted)
Follow Up Note  RE: Kendra Patton MRN: [id number] DOB: 04-28-1986 Date of Office Visit: 07/28/2021  Referring provider: Allwardt, Randa Evens, PA-C Primary care provider: Allwardt, Randa Evens, PA-C  Chief Complaint: No chief complaint on file.  History of Present Illness: I had the pleasure of seeing Kendra Patton for a follow up visit at the [facility] on 07/28/2021. She is a 35 y.o. 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 Her previous allergy office visit was on 05/26/2021 with Dr. Maudie Mercury. Today is a regular follow up visit.  Other allergic rhinitis Perennial rhinoconjunctivitis symptoms for 25+ years with worsening in the spring and fall.  Tried Zyrtec and Nasacort with some benefit.  Skin testing over 10 years ago showed multiple positives per patient report.  No prior AIT. Today's skin testing showed: Positive to grass, weed, ragweed, trees, mold, dust mites, cat, horse, dog. Start environmental control measures as below. Start Singulair (montelukast) '10mg'$  daily at night. Use over the counter antihistamines such as Zyrtec (cetirizine), Claritin (loratadine), Allegra (fexofenadine), or Xyzal (levocetirizine) daily as needed. May take twice a day during allergy flares. May switch antihistamines every few months. Use Flonase (fluticasone) nasal spray 1 spray per nostril twice a day as needed for nasal congestion. Nasal saline spray (i.e., Simply Saline) or nasal saline lavage (i.e., NeilMed) is recommended as needed and prior to medicated nasal sprays. Consider allergy injections for long term control if above medications do not help the symptoms - handout given.    Allergic conjunctivitis of both eyes See assessment and plan as above.   Asthma Diagnosed with asthma over 25+ years ago.  Main triggers are food and environmental allergies.  Tried Advair in the past.  Uses  albuterol few times per week with good benefit.  2022 normal chest x-ray.  Had COVID-19. Today's spirometry was normal with 4% improvement in FEV1 post bronchodilator treatment.  Clinically feeling unchanged. Daily controller medication(s):  Start Singulair (montelukast) '10mg'$  daily at night. Cautioned that in some children/adults can experience behavioral changes including hyperactivity, agitation, depression, sleep disturbances and suicidal ideations. These side effects are rare, but if you notice them you should notify me and discontinue Singulair (montelukast). May use albuterol rescue inhaler 2 puffs every 4 to 6 hours as needed for shortness of breath, chest tightness, coughing, and wheezing. May use albuterol rescue inhaler 2 puffs 5 to 15 minutes prior to strenuous physical activities. Monitor frequency of use.  Get spirometry at next visit. If no improvement, will add on daily ICS inhaler next.    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 Worse after handling raw vegetables and uncooked dough. Using topical steroid creams with some benefit. See below for proper skin care. Use Eucrisa (crisaborole) 2% ointment twice a day on mild eczema flares on the face and body. This is a non-steroid ointment. Samples given. If it burns, place the medication in the refrigerator. Apply a thin layer of moisturizer and then apply the Eucrisa on top of it. If no improvement, consider Dupixent injections - handout given.   Other adverse food reactions, not elsewhere classified, subsequent encounter Fresh fruits/vegetables cause perioral symptoms - okay with cooked forms. Tree nuts and most recently peanuts causing perioral pruritus and wheezing. Avocados cause perioral pruritus and lip swelling.  Today's skin testing showed: Positive to peanuts, hazelnut and borderline to soy. Negative to avocado. Continue to avoid foods that bother you -  fresh fruits/vegetables, avocados, peanuts, tree nuts. I have  prescribed epinephrine injectable and demonstrated proper use. For mild symptoms you can take over the counter antihistamines such as Benadryl and monitor symptoms closely. If symptoms worsen or if you have severe symptoms including breathing issues, throat closure, significant swelling, whole body hives, severe diarrhea and vomiting, lightheadedness then inject epinephrine and seek immediate medical care afterwards. Action plan given. 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 This is caused by cross reactivity of pollen with fresh fruits and vegetables, and nuts. Symptoms are usually localized in the form of itching and burning in mouth and throat. Very rarely it can progress to more severe symptoms. Eating foods in cooked or processed forms usually minimizes symptoms. I recommended avoidance of eating the problem foods, especially during the peak season(s). A list of common pollens and food cross-reactivities was provided to the patient.    Oral allergy syndrome, subsequent encounter See assessment and plan as above.   Return in about 2 months (around 07/26/2021).    Assessment and Plan: Kendra Patton is a 36 y.o. female with: No problem-specific Assessment & Plan notes found for this encounter.  No follow-ups on file.  No orders of the defined types were placed in this encounter.  Lab Orders  No laboratory test(s) ordered today    Diagnostics: Spirometry:  Tracings reviewed. Her effort: {Blank single:19197::"Good reproducible efforts.","It was hard to get consistent efforts and there is a question as to whether this reflects a maximal maneuver.","Poor effort, data can not be interpreted."} FVC: ***L FEV1: ***L, ***% predicted FEV1/FVC ratio: ***% Interpretation: {Blank single:19197::"Spirometry consistent with mild obstructive disease","Spirometry consistent with moderate obstructive disease","Spirometry consistent with  severe obstructive disease","Spirometry consistent with possible restrictive disease","Spirometry consistent with mixed obstructive and restrictive disease","Spirometry uninterpretable due to technique","Spirometry consistent with normal pattern","No overt abnormalities noted given today's efforts"}.  Please see scanned spirometry results for details.  Skin Testing: {Blank single:19197::"Select foods","Environmental allergy panel","Environmental allergy panel and select foods","Food allergy panel","None","Deferred due to recent antihistamines use"}. Positive test to: ***. Negative test to: ***.  Results discussed with patient/family.   Medication List:  Current Outpatient Medications  Medication Sig Dispense Refill  . albuterol (VENTOLIN HFA) 108 (90 Base) MCG/ACT inhaler Inhale 2 puffs into the lungs every 4 (four) hours as needed for wheezing or shortness of breath (coughing fits). 18 g 1  . amphetamine-dextroamphetamine (ADDERALL XR) 30 MG 24 hr capsule Take 1 capsule (30 mg total) by mouth every morning. 30 capsule 0  . buPROPion (WELLBUTRIN XL) 300 MG 24 hr tablet Take 1 tablet (300 mg total) by mouth daily. 90 tablet 0  . cetirizine (ZYRTEC ALLERGY) 10 MG tablet Take 1 tablet (10 mg total) by mouth daily. 30 tablet 5  . clonazePAM (KLONOPIN) 0.5 MG tablet Take 0.5 tablets (0.25 mg total) by mouth 2 (two) times daily as needed for anxiety. 30 tablet 0  . Crisaborole (EUCRISA) 2 % OINT Apply 1 application topically 2 (two) times daily as needed (rash on hands). 100 g 2  . EPINEPHrine (AUVI-Q) 0.3 mg/0.3 mL IJ SOAJ injection Inject 0.3 mg into the muscle as needed for anaphylaxis. 1 each 1  . ferrous sulfate 325 (65 FE) MG EC tablet Take 1 tablet (325 mg total) by mouth daily with breakfast. 90 tablet 3  . fluticasone (FLONASE) 50 MCG/ACT nasal spray Place 1 spray into both nostrils 2 (two) times daily as needed for allergies or rhinitis. [place]  g 5  . metroNIDAZOLE (METROGEL) 0.75 % gel Apply one  application twice per week to prevent BV recurrence 45 g 1  . montelukast (SINGULAIR) 10 MG tablet Take 1 tablet (10 mg total) by mouth at bedtime. 30 tablet 5  . spironolactone (ALDACTONE) 25 MG tablet Take 50 mg by mouth daily.    . Vitamin D, Ergocalciferol, (DRISDOL) 1.25 MG (50000 UNIT) CAPS capsule Take once capsule by mouth once a week for 12 weeks 5 capsule 3   No current facility-administered medications for this visit.   Allergies: Allergies  Allergen Reactions  . Aspirin Other (See Comments)    "Thins bloods too much"  . Compazine [Prochlorperazine Edisylate]     Dystonic.  Marland Kitchen Other     Compazine   I reviewed her past medical history, social history, family history, and environmental history and no significant changes have been reported from her previous visit.  Review of Systems  Constitutional:  Negative for appetite change, chills, fever and unexpected weight change.  HENT:  Positive for congestion, rhinorrhea and sneezing.   Eyes:  Positive for itching.  Respiratory:  Positive for cough, chest tightness, shortness of breath and wheezing.   Cardiovascular:  Negative for chest pain.  Gastrointestinal:  Negative for abdominal pain.  Genitourinary:  Negative for difficulty urinating.  Skin:  Positive for rash.  Allergic/Immunologic: Positive for environmental allergies and food allergies.  Neurological:  Positive for headaches.   Objective: There were no vitals taken for this visit. There is no height or weight on file to calculate BMI. Physical Exam Vitals and nursing note reviewed.  Constitutional:      Appearance: Normal appearance. She is well-developed.  HENT:     Head: Normocephalic and atraumatic.     Right Ear: Tympanic membrane and external ear normal.     Left Ear: Tympanic membrane and external ear normal.     Nose: Nose normal.     Mouth/Throat:     Mouth: Mucous membranes are moist.     Pharynx: Oropharynx is clear.  Eyes:     Conjunctiva/sclera:  Conjunctivae normal.  Cardiovascular:     Rate and Rhythm: Normal rate and regular rhythm.     Heart sounds: Normal heart sounds. No murmur heard.   No friction rub. No gallop.  Pulmonary:     Effort: Pulmonary effort is normal.     Breath sounds: Normal breath sounds. No wheezing, rhonchi or rales.  Musculoskeletal:     Cervical back: Neck supple.  Skin:    General: Skin is warm and dry.     Findings: Rash present.     Comments: Dry, cracked skin on the hands b/l.  Neurological:     Mental Status: She is alert and oriented to person, place, and time.  Psychiatric:        Behavior: Behavior normal.  Previous notes and tests were reviewed. The plan was reviewed with the patient/family, and all questions/concerned were addressed.  It was my pleasure to see Kendra Patton today and participate in her care. Please feel free to contact me with any questions or concerns.  Sincerely,  Rexene Alberts, DO Allergy & Immunology  [facility] office: [place] office: [phone]

## 2021-08-13 ENCOUNTER — Ambulatory Visit: Payer: BC Managed Care – PPO | Admitting: Allergy

## 2022-02-13 ENCOUNTER — Other Ambulatory Visit: Payer: Self-pay

## 2022-02-13 ENCOUNTER — Emergency Department (HOSPITAL_BASED_OUTPATIENT_CLINIC_OR_DEPARTMENT_OTHER)
Admission: EM | Admit: 2022-02-13 | Discharge: 2022-02-13 | Disposition: A | Discharge status: Home / Self Care | Payer: BC Managed Care – PPO | Attending: Emergency Medicine | Admitting: Emergency Medicine

## 2022-02-13 ENCOUNTER — Emergency Department (HOSPITAL_BASED_OUTPATIENT_CLINIC_OR_DEPARTMENT_OTHER): Payer: BC Managed Care – PPO | Admitting: Radiology

## 2022-02-13 ENCOUNTER — Encounter (HOSPITAL_BASED_OUTPATIENT_CLINIC_OR_DEPARTMENT_OTHER): Payer: Self-pay

## 2022-02-13 DIAGNOSIS — R000 Tachycardia, unspecified: Secondary | ICD-10-CM | POA: Insufficient documentation

## 2022-02-13 DIAGNOSIS — R42 Dizziness and giddiness: Secondary | ICD-10-CM

## 2022-02-13 DIAGNOSIS — F419 Anxiety disorder, unspecified: Secondary | ICD-10-CM | POA: Diagnosis not present

## 2022-02-13 DIAGNOSIS — F410 Panic disorder [episodic paroxysmal anxiety] without agoraphobia: Secondary | ICD-10-CM | POA: Diagnosis not present

## 2022-02-13 DIAGNOSIS — R0602 Shortness of breath: Secondary | ICD-10-CM | POA: Diagnosis not present

## 2022-02-13 LAB — BASIC METABOLIC PANEL
Anion gap: 12 (ref 5–15)
BUN: 8 mg/dL (ref 6–20)
CO2: 22 mmol/L (ref 22–32)
Calcium: 9.2 mg/dL (ref 8.9–10.3)
Chloride: 101 mmol/L (ref 98–111)
Creatinine, Ser: 0.53 mg/dL (ref 0.44–1.00)
GFR, Estimated: >60 mL/min (ref >60)
Glucose, Bld: 90 mg/dL (ref 70–99)
Potassium: 3.8 mmol/L (ref 3.5–5.1)
Sodium: 135 mmol/L (ref 135–145)

## 2022-02-13 LAB — URINALYSIS, ROUTINE W REFLEX MICROSCOPIC
Bilirubin Urine: NEGATIVE
Glucose, UA: NEGATIVE mg/dL
Ketones, ur: 40 mg/dL — AB
Leukocytes,Ua: NEGATIVE
Nitrite: NEGATIVE
Protein, ur: NEGATIVE mg/dL
Specific Gravity, Urine: 1.016 (ref 1.005–1.030)
pH: 5.5 (ref 5.0–8.0)

## 2022-02-13 LAB — CBC
HCT: 38.8 % (ref 36.0–46.0)
Hemoglobin: 12.3 g/dL (ref 12.0–15.0)
MCH: 25.2 pg — ABNORMAL LOW (ref 26.0–34.0)
MCHC: 31.7 g/dL (ref 30.0–36.0)
MCV: 79.3 fL — ABNORMAL LOW (ref 80.0–100.0)
Platelets: 407 10*3/uL — ABNORMAL HIGH (ref 150–400)
RBC: 4.89 MIL/uL (ref 3.87–5.11)
RDW: 13.7 % (ref 11.5–15.5)
WBC: 9.1 10*3/uL (ref 4.0–10.5)
nRBC: 0 % (ref 0.0–0.2)

## 2022-02-13 LAB — PREGNANCY, URINE: Preg Test, Ur: NEGATIVE

## 2022-02-13 LAB — TROPONIN I (HIGH SENSITIVITY): Troponin I (High Sensitivity): <2 ng/L (ref <18)

## 2022-02-13 MED ORDER — SODIUM CHLORIDE 0.9 % IV BOLUS
1000.0000 mL | Freq: Once | INTRAVENOUS | Status: AC
  Administered 2022-02-13: 1000 mL via INTRAVENOUS

## 2022-02-13 MED ORDER — LORAZEPAM 2 MG/ML IJ SOLN
1.0000 mg | Freq: Once | INTRAMUSCULAR | Status: AC
  Administered 2022-02-13: 1 mg via INTRAVENOUS
  Filled 2022-02-13: qty 1

## 2022-02-13 NOTE — ED Provider Notes (Signed)
[facility] EMERGENCY DEPT Provider Note   CSN: [id number] Arrival date & time: 02/13/22  1335     History  Chief Complaint  Patient presents with   Dizziness    Kendra Patton is a 36 y.o. female.  Pt is a 36yo female who presents with dizziness.  She says that it started at work after she got into an argument with someone.  She initially felt like things were spinning, now feels more lightheaded.  No related to head movement.  She also feels like her heart is pounding and feels like a lump is in her throat.  Feels SOB.  No wheezing.  No recent cough or cold symptoms.  No fevers.  No chest pain.  Feels a bit anxious.  Has had prior panic attacks that felt somewhat similar      Home Medications Prior to Admission medications   Medication Sig Start Date End Date Taking? Authorizing Provider  albuterol (VENTOLIN HFA) 108 (90 Base) MCG/ACT inhaler Inhale 2 puffs into the lungs every 4 (four) hours as needed for wheezing or shortness of breath (coughing fits). 05/26/21  Yes Garnet Sierras, DO  amphetamine-dextroamphetamine (ADDERALL XR) 30 MG 24 hr capsule Take 1 capsule (30 mg total) by mouth every morning. 06/01/21  Yes Dutch Quint B, FNP  cetirizine (ZYRTEC ALLERGY) 10 MG tablet Take 1 tablet (10 mg total) by mouth daily. 05/26/21  Yes Garnet Sierras, DO  buPROPion (WELLBUTRIN XL) 300 MG 24 hr tablet Take 1 tablet (300 mg total) by mouth daily. Patient not taking: Reported on 02/13/2022 05/20/21   Allwardt, Randa Evens, PA-C  clonazePAM (KLONOPIN) 0.5 MG tablet Take 0.5 tablets (0.25 mg total) by mouth 2 (two) times daily as needed for anxiety. 05/12/21 06/11/21  Marin Olp, MD  Crisaborole (EUCRISA) 2 % OINT Apply 1 application topically 2 (two) times daily as needed (rash on hands). 05/26/21   Garnet Sierras, DO  EPINEPHrine (AUVI-Q) 0.3 mg/0.3 mL IJ SOAJ injection Inject 0.3 mg into the muscle as needed for anaphylaxis. 05/26/21   Garnet Sierras, DO  ferrous sulfate 325 (65 FE) MG EC  tablet Take 1 tablet (325 mg total) by mouth daily with breakfast. Patient not taking: Reported on 02/13/2022 01/05/21   Brunetta Jeans, PA-C  fluticasone [facility]) 50 MCG/ACT nasal spray Place 1 spray into both nostrils 2 (two) times daily as needed for allergies or rhinitis. 05/26/21   Garnet Sierras, DO  metroNIDAZOLE (METROGEL) 0.75 % gel Apply one application twice per week to prevent BV recurrence Patient not taking: Reported on 02/13/2022 04/28/21   Inda Coke, PA  montelukast (SINGULAIR) 10 MG tablet Take 1 tablet (10 mg total) by mouth at bedtime. Patient not taking: Reported on 02/13/2022 05/26/21   Garnet Sierras, DO  spironolactone (ALDACTONE) 25 MG tablet Take 50 mg by mouth daily. Patient not taking: Reported on 02/13/2022    [provider]  Vitamin D, Ergocalciferol, (DRISDOL) 1.25 MG (50000 UNIT) CAPS capsule Take once capsule by mouth once a week for 12 weeks Patient not taking: Reported on 02/13/2022 01/05/21   Brunetta Jeans, PA-C      Allergies    Aspirin, Compazine [prochlorperazine edisylate], Other, and Penicillins    Review of Systems   Review of Systems  Constitutional:  Negative for chills, diaphoresis, fatigue and fever.  HENT:  Negative for congestion, rhinorrhea and sneezing.   Eyes: Negative.   Respiratory:  Positive for shortness of breath. Negative for  cough and chest tightness.   Cardiovascular:  Negative for chest pain and leg swelling.  Gastrointestinal:  Negative for abdominal pain, blood in stool, diarrhea, nausea and vomiting.  Genitourinary:  Negative for difficulty urinating, flank pain, frequency and hematuria.  Musculoskeletal:  Negative for arthralgias and back pain.  Skin:  Negative for rash.  Neurological:  Positive for light-headedness. Negative for speech difficulty, weakness, numbness and headaches.  Psychiatric/Behavioral:  The patient is nervous/anxious.    Physical Exam Updated Vital Signs BP 121/78    Pulse 100    Temp (!) 97.4  F (36.3 C)    Resp 18    Ht 5\' 7"  (1.702 m)    Wt 83.9 kg    LMP 01/16/2022 (Approximate)    SpO2 100%    BMI 28.98 kg/m  Physical Exam Constitutional:      Appearance: She is well-developed.  HENT:     Head: Normocephalic and atraumatic.  Eyes:     Pupils: Pupils are equal, round, and reactive to light.  Cardiovascular:     Rate and Rhythm: Regular rhythm. Tachycardia present.     Heart sounds: Normal heart sounds.  Pulmonary:     Effort: Pulmonary effort is normal. No respiratory distress.     Breath sounds: Normal breath sounds. No wheezing or rales.  Chest:     Chest wall: No tenderness.  Abdominal:     General: Bowel sounds are normal.     Palpations: Abdomen is soft.     Tenderness: There is no abdominal tenderness. There is no guarding or rebound.  Musculoskeletal:        General: Normal range of motion.     Cervical back: Normal range of motion and neck supple.  Lymphadenopathy:     Cervical: No cervical adenopathy.  Skin:    General: Skin is warm and dry.     Findings: No rash.  Neurological:     Mental Status: She is alert and oriented to person, place, and time.     Comments: Motor 5/5 all extremities Sensation grossly intact to LT all extremities Finger to Nose intact, no pronator drift CN II-XII grossly intact      ED Results / Procedures / Treatments   Labs (all labs ordered are listed, but only abnormal results are displayed) Labs Reviewed  CBC - Abnormal; Notable for the following components:      Result Value   MCV 79.3 (*)    MCH 25.2 (*)    Platelets 407 (*)    All other components within normal limits  URINALYSIS, ROUTINE W REFLEX MICROSCOPIC - Abnormal; Notable for the following components:   Hgb urine dipstick TRACE (*)    Ketones, ur 40 (*)    Bacteria, UA RARE (*)    All other components within normal limits  BASIC METABOLIC PANEL  PREGNANCY, URINE  TROPONIN I (HIGH SENSITIVITY)    EKG EKG Interpretation  Date/Time:  Saturday  February 13 2022 13:59:47 EST Ventricular Rate:  97 PR Interval:  144 QRS Duration: 87 QT Interval:  360 QTC Calculation: 458 R Axis:   63 Text Interpretation: Sinus rhythm since last tracing no significant change Confirmed by Malvin Johns [phone]) on 02/13/2022 3:02:17 PM  Radiology DG Chest 2 View  Result Date: 02/13/2022 CLINICAL DATA:  Shortness of breath.  Dizziness. EXAM: CHEST - 2 VIEW COMPARISON:  05/08/2021 FINDINGS: The heart size and mediastinal contours are within normal limits. Both lungs are clear. The visualized skeletal structures are unremarkable. IMPRESSION:  No active cardiopulmonary disease. Electronically Signed   By: Marlaine Hind M.D.   On: 02/13/2022 14:41    Procedures Procedures    Medications Ordered in ED Medications  sodium chloride 0.9 % bolus 1,000 mL (1,000 mLs Intravenous New Bag/Given 02/13/22 1541)  LORazepam (ATIVAN) injection 1 mg (1 mg Intravenous Given 02/13/22 1542)    ED Course/ Medical Decision Making/ A&P                           Medical Decision Making Amount and/or Complexity of Data Reviewed Labs: ordered. Radiology: ordered.  Risk Prescription drug management.   Patient is a 36 year old female who presents with dizziness and anxiety.  It started after an interaction at work.  She was mildly tachycardic on arrival but felt anxious.  She had no hypoxia.  She reported some shortness of breath but she had no increased work of breathing.  Her lungs are clear on exam.  She was given IV fluids and a dose of Ativan and her symptoms resolved.  She does not have other symptoms that sound more concerning for PE.  No stroke symptoms.  No symptoms that sound more concerning for ACS.  Her EKG shows no evidence of arrhythmias.  This was interpreted by me.  Her labs show normal hemoglobin without anemia.  Her electrolytes are nonconcerning.  Pregnancy test is negative.  Urinalysis shows no signs of infection.  Her symptoms resolved in the ED.  She was  discharged home in good condition.  She was encouraged to follow-up with her PCP this week.  Return precautions were given.  Final Clinical Impression(s) / ED Diagnoses Final diagnoses:  Dizziness    Rx / DC Orders ED Discharge Orders     None         Malvin Johns, MD 02/13/22 [phone]

## 2022-02-13 NOTE — ED Notes (Signed)
Patient transported to X-ray 

## 2022-02-13 NOTE — ED Triage Notes (Signed)
Pt arrives POV with her fiance.  Reports she started feeling dizzy around 11:00 am today after someone made her mad at work.  She states she continues to feel dizzy/lightheaded/nauseous but has not vomited.  She is also feeling short of breath.  Denies any pain.  NAD noted in triage.  GCS 15.

## 2022-02-13 NOTE — ED Notes (Signed)
Patient given urine cup for sample.  °

## 2022-02-13 NOTE — ED Notes (Signed)
EMT-P provided AVS using Teachback Method. Patient verbalizes understanding of Discharge Instructions. Opportunity for Questioning and Answers were provided by EMT-P. Patient Discharged from ED.  ? ?

## 2022-03-11 ENCOUNTER — Ambulatory Visit (INDEPENDENT_AMBULATORY_CARE_PROVIDER_SITE_OTHER): Payer: BC Managed Care – PPO | Admitting: Allergy

## 2022-03-11 ENCOUNTER — Encounter: Payer: Self-pay | Admitting: Allergy

## 2022-03-11 ENCOUNTER — Other Ambulatory Visit: Payer: Self-pay

## 2022-03-11 VITALS — BP 128/88 | HR 111 | Temp 98.5°F | Resp 18 | Ht 67.0 in | Wt 200.1 lb

## 2022-03-11 DIAGNOSIS — H1010 Acute atopic conjunctivitis, unspecified eye: Secondary | ICD-10-CM

## 2022-03-11 DIAGNOSIS — L301 Dyshidrosis [pompholyx]: Secondary | ICD-10-CM | POA: Diagnosis not present

## 2022-03-11 DIAGNOSIS — T781XXD Other adverse food reactions, not elsewhere classified, subsequent encounter: Secondary | ICD-10-CM

## 2022-03-11 DIAGNOSIS — J4540 Moderate persistent asthma, uncomplicated: Secondary | ICD-10-CM | POA: Diagnosis not present

## 2022-03-11 DIAGNOSIS — J302 Other seasonal allergic rhinitis: Secondary | ICD-10-CM

## 2022-03-11 DIAGNOSIS — H1013 Acute atopic conjunctivitis, bilateral: Secondary | ICD-10-CM | POA: Diagnosis not present

## 2022-03-11 MED ORDER — ALBUTEROL SULFATE HFA 108 (90 BASE) MCG/ACT IN AERS: 2.0000 | INHALATION_SPRAY | RESPIRATORY_TRACT | 1 refills | Status: AC | PRN

## 2022-03-11 MED ORDER — ARMONAIR DIGIHALER 113 MCG/ACT IN AEPB: 1.0000 | INHALATION_SPRAY | Freq: Two times a day (BID) | RESPIRATORY_TRACT | 3 refills | Status: DC

## 2022-03-11 NOTE — Assessment & Plan Note (Deleted)
Past history - Diagnosed with asthma over 25+ years ago.  Main triggers are food and environmental allergies.  Tried Advair in the past.  Uses albuterol few times per week with good benefit.  2022 normal chest x-ray.  Had COVID-19. 2022 spirometry was normal with 4% improvement in FEV1 post bronchodilator treatment.  Clinically feeling unchanged. ?Interim history - Singulair ineffective, using albuterol 4 times per week with good benefit. Breathing worse at home where there is mold issue.  ?? Daily controller medication(s): START Armonair 228mg 1 puff twice a day and rinse mouth after each use. Sample given of the higher dose - will send a prescription of the lower dose (1171m). ?? Demonstrated proper use.  ?? May use albuterol rescue inhaler 2 puffs every 4 to 6 hours as needed for shortness of breath, chest tightness, coughing, and wheezing. May use albuterol rescue inhaler 2 puffs 5 to 15 minutes prior to strenuous physical activities. Monitor frequency of use.  ?? Get spirometry at next visit. ?

## 2022-03-11 NOTE — Assessment & Plan Note (Addendum)
Past history - Perennial rhinoconjunctivitis symptoms for 25+ years with worsening in the spring and fall.  Tried Zyrtec and Nasacort with some benefit.  Skin testing over 10 years ago showed multiple positives per patient report.  No prior AIT. 2022 skin testing showed: Positive to grass, weed, ragweed, trees, mold, dust mites, cat, horse, dog.  ?Interim history - worse symptoms at home where having a mold issue. Singulair ineffective.  ?? Continue environmental control measures as below. ?? Use over the counter antihistamines such as Zyrtec (cetirizine), Claritin (loratadine), Allegra (fexofenadine), or Xyzal (levocetirizine) daily as needed. May take twice a day during allergy flares. May switch antihistamines every few months. ?? Use Flonase (fluticasone) nasal spray 1 spray per nostril twice a day as needed for nasal congestion.  ?? Nasal saline spray (i.e., Simply Saline) or nasal saline lavage (i.e., NeilMed) is recommended as needed and prior to medicated nasal sprays. ?? Letter written and mailed regarding mold allergy and asthma.  ?

## 2022-03-11 NOTE — Assessment & Plan Note (Signed)
Past history - Diagnosed with asthma over 25+ years ago.  Main triggers are food and environmental allergies.  Tried Advair in the past.  Uses albuterol few times per week with good benefit.  2022 normal chest x-ray.  Had COVID-19. 2022 spirometry was normal with 4% improvement in FEV1 post bronchodilator treatment.  Clinically feeling unchanged. ?Interim history - Singulair ineffective, using albuterol 4 times per week with good benefit. Breathing worse at home where there is mold issue.  ?? Daily controller medication(s): START Armonair 25mg 1 puff twice a day and rinse mouth after each use. Sample given of the higher dose - will send a prescription of the lower dose (1169m). ?? Demonstrated proper use.  ?? May use albuterol rescue inhaler 2 puffs every 4 to 6 hours as needed for shortness of breath, chest tightness, coughing, and wheezing. May use albuterol rescue inhaler 2 puffs 5 to 15 minutes prior to strenuous physical activities. Monitor frequency of use.  ?? Get spirometry at next visit. ?

## 2022-03-11 NOTE — Assessment & Plan Note (Signed)
Past history - Fresh fruits/vegetables cause perioral symptoms - okay with cooked forms. Tree nuts and most recently peanuts causing perioral pruritus and wheezing. Avocados cause perioral pruritus and lip swelling. 2022 skin testing showed: Positive to peanuts, hazelnut and borderline to soy. Negative to avocado. ?Interim history - no reactions.  ?? Continue to avoid foods that bother you - fresh fruits/vegetables, avocados, peanuts, tree nuts.  ?? For mild symptoms you can take over the counter antihistamines such as Benadryl and monitor symptoms closely. If symptoms worsen or if you have severe symptoms including breathing issues, throat closure, significant swelling, whole body hives, severe diarrhea and vomiting, lightheadedness then inject epinephrine and seek immediate medical care afterwards. ?? Action plan in place.  ?

## 2022-03-11 NOTE — Assessment & Plan Note (Signed)
Past history - On hands for 20+ years. Worse after handling raw vegetables and uncooked dough. Using topical steroid creams with some benefit. ?Interim history - Eucrisa ineffective. ?? Continue proper skin care. ?? START opzelura twice a day on the hands. Samples given. ?? If it works well let me know and will send in a prescription.  ?? Consider Dupixent next.  ?

## 2022-03-11 NOTE — Progress Notes (Signed)
? ?Follow Up Note ? ?RE: Kendra Patton MRN: [id number] DOB: [birth year] ?Date of Office Visit: 03/11/2022 ? ?Referring provider: Allwardt, Kendra Evens, PA-C ?Primary care provider: Allwardt, Kendra Evens, PA-C ? ?Chief Complaint: Allergic Reaction (Black mold ), Eczema, and Rash (Rash has not gone away since living in the rented house eucrisa does not help ) ? ?History of Present Illness: ?I had the pleasure of seeing Kendra Patton for a follow up visit at the [facility] on 03/11/2022. She is a 36 y.o. female, who is being followed for allergic rhinoconjunctivitis, asthma, dyshidrotic eczema, food allergy and oral allergy syndrome. Her previous allergy office visit was on 05/26/2021 with Dr. Maudie Patton. Today is a regular follow up visit. ? ?Environmental allergies ?Tried Singulair but stopped as she is not sure if it helped.  ?Currently on Flonase 1 spray per nostril daily. No nosebleeds.  ?Takes OTC Claritin or benadryl due to itching with some benefit.  ? ?Patient is very concerned about the mold exposure at her current rental home. She has been living there for 1.5 years and her lease is not up until November 2023. ? ?She saw mold in her crawl space and also in the window units.  ?She is getting mold testing done at her home.  ? ?She noted a variety of symptoms since living in this home including - itching, wheezing, shortness of breath, nasal congestion, sore throat, ear pain, headaches, fatigue, low energy, palpitations, brain fog. ? ?She even went to the ER twice due to tingling/numbness, tremors. She had imaging of her brain which was unremarkable. ? ?She is requesting a letter stating that she is allergic to mold. ? ?Asthma ?She has been using albuterol 4 times per week with good benefit for wheezing. ?Willing to try ICS inhaler.  ?  ?Dyshidrotic eczema ?Kendra Patton with no benefit. Hands are very dry and cracked. ? ?Food  ?Avoiding fresh fruits/vegetables, avocados, peanuts, tree nuts.  ?   ?Assessment and Plan: ?Kendra Patton is a 36 y.o. female with: ?Not well controlled moderate persistent asthma ?Past history - Diagnosed with asthma over 25+ years ago.  Main triggers are food and environmental allergies.  Tried Advair in the past.  Uses albuterol few times per week with good benefit.  2022 normal chest x-ray.  Had COVID-19. 2022 spirometry was normal with 4% improvement in FEV1 post bronchodilator treatment.  Clinically feeling unchanged. ?Interim history - Singulair ineffective, using albuterol 4 times per week with good benefit. Breathing worse at home where there is mold issue.  ?Daily controller medication(s): START Armonair 261mg 1 puff twice a day and rinse mouth after each use. Sample given of the higher dose - will send a prescription of the lower dose (1112m). ?Demonstrated proper use.  ?May use albuterol rescue inhaler 2 puffs every 4 to 6 hours as needed for shortness of breath, chest tightness, coughing, and wheezing. May use albuterol rescue inhaler 2 puffs 5 to 15 minutes prior to strenuous physical activities. Monitor frequency of use.  ?Get spirometry at next visit. ? ?Seasonal and perennial allergic rhinoconjunctivitis ?Past history - Perennial rhinoconjunctivitis symptoms for 25+ years with worsening in the spring and fall.  Tried Zyrtec and Nasacort with some benefit.  Skin testing over 10 years ago showed multiple positives per patient report.  No prior AIT. 2022 skin testing showed: Positive to grass, weed, ragweed, trees, mold, dust mites, cat, horse, dog.  ?Interim history - worse symptoms at home where having a mold issue. Singulair  ineffective.  ?Continue environmental control measures as below. ?Use over the counter antihistamines such as Zyrtec (cetirizine), Claritin (loratadine), Allegra (fexofenadine), or Xyzal (levocetirizine) daily as needed. May take twice a day during allergy flares. May switch antihistamines every few months. ?Use Flonase (fluticasone) nasal spray 1  spray per nostril twice a day as needed for nasal congestion.  ?Nasal saline spray (i.e., Simply Saline) or nasal saline lavage (i.e., NeilMed) is recommended as needed and prior to medicated nasal sprays. ?Letter written and mailed regarding mold allergy and asthma.  ? ?Dyshidrotic eczema ?Past history - On hands for 20+ years. Worse after handling raw vegetables and uncooked dough. Using topical steroid creams with some benefit. ?Interim history - Eucrisa ineffective. ?Continue proper skin care. ?START opzelura twice a day on the hands. Samples given. ?If it works well let me know and will send in a prescription.  ?Consider Dupixent next.  ? ?Other adverse food reactions, not elsewhere classified, subsequent encounter ?Past history - Fresh fruits/vegetables cause perioral symptoms - okay with cooked forms. Tree nuts and most recently peanuts causing perioral pruritus and wheezing. Avocados cause perioral pruritus and lip swelling. 2022 skin testing showed: Positive to peanuts, hazelnut and borderline to soy. Negative to avocado. ?Interim history - no reactions.  ?Continue to avoid foods that bother you - fresh fruits/vegetables, avocados, peanuts, tree nuts.  ?For mild symptoms you can take over the counter antihistamines such as Benadryl and monitor symptoms closely. If symptoms worsen or if you have severe symptoms including breathing issues, throat closure, significant swelling, whole body hives, severe diarrhea and vomiting, lightheadedness then inject epinephrine and seek immediate medical care afterwards. ?Action plan in place.  ? ?Return in about 2 months (around 05/11/2022). ? ?Meds ordered this encounter  ?Medications  ? albuterol (VENTOLIN HFA) 108 (90 Base) MCG/ACT inhaler  ?  Sig: Inhale 2 puffs into the lungs every 4 (four) hours as needed for wheezing or shortness of breath (coughing fits).  ?  Dispense:  18 g  ?  Refill:  1  ? Fluticasone Propionate,sensor, (ARMONAIR DIGIHALER) 113 MCG/ACT AEPB  ?   Sig: Inhale 1 puff into the lungs in the morning and at bedtime. Rinse mouth after each use.  ?  Dispense:  1 each  ?  Refill:  3  ?  [phone]  ? ?Lab Orders  ?No laboratory test(s) ordered today  ? ? ?Diagnostics: ?Spirometry:  ?Tracings reviewed. Her effort: Good reproducible efforts. ?FVC: 4.22L ?FEV1: 3.21L, 101% predicted ?FEV1/FVC ratio: 76% ?Interpretation: Spirometry consistent with normal pattern.  ?Please see scanned spirometry results for details. ? ?Medication List:  ?Current Outpatient Medications  ?Medication Sig Dispense Refill  ? amphetamine-dextroamphetamine (ADDERALL XR) 30 MG 24 hr capsule Take 1 capsule (30 mg total) by mouth every morning. 30 capsule 0  ? EPINEPHrine (AUVI-Q) 0.3 mg/0.3 mL IJ SOAJ injection Inject 0.3 mg into the muscle as needed for anaphylaxis. 1 each 1  ? fluticasone (FLONASE) 50 MCG/ACT nasal spray Place 1 spray into both nostrils 2 (two) times daily as needed for allergies or rhinitis. 16 g 5  ? Fluticasone Propionate,sensor, (ARMONAIR DIGIHALER) 113 MCG/ACT AEPB Inhale 1 puff into the lungs in the morning and at bedtime. Rinse mouth after each use. 1 each 3  ? metroNIDAZOLE (METROGEL) 0.75 % gel Apply one application twice per week to prevent BV recurrence 45 g 1  ? Vitamin D, Ergocalciferol, (DRISDOL) 1.25 MG (50000 UNIT) CAPS capsule Take once capsule by mouth once a week for 12 weeks  5 capsule 3  ? albuterol (VENTOLIN HFA) 108 (90 Base) MCG/ACT inhaler Inhale 2 puffs into the lungs every 4 (four) hours as needed for wheezing or shortness of breath (coughing fits). 18 g 1  ? buPROPion (WELLBUTRIN XL) 300 MG 24 hr tablet Take 1 tablet (300 mg total) by mouth daily. (Patient not taking: Reported on 03/11/2022) 90 tablet 0  ? cetirizine (ZYRTEC ALLERGY) 10 MG tablet Take 1 tablet (10 mg total) by mouth daily. (Patient not taking: Reported on 03/11/2022) 30 tablet 5  ? clonazePAM (KLONOPIN) 0.5 MG tablet Take 0.5 tablets (0.25 mg total) by mouth 2 (two) times daily as  needed for anxiety. 30 tablet 0  ? ferrous sulfate 325 (65 FE) MG EC tablet Take 1 tablet (325 mg total) by mouth daily with breakfast. (Patient not taking: Reported on 03/11/2022) 90 tablet 3  ? spironolactone (ALDACTO

## 2022-03-11 NOTE — Patient Instructions (Addendum)
Will mail you the letter this week. ? ?Environmental allergies ?2022 skin testing showed: Positive to grass, weed, ragweed, trees, mold, dust mites, cat, horse, dog.  ? ?Continue environmental control measures as below. ?Use over the counter antihistamines such as Zyrtec (cetirizine), Claritin (loratadine), Allegra (fexofenadine), or Xyzal (levocetirizine) daily as needed. May take twice a day during allergy flares. May switch antihistamines every few months. ?Use Flonase (fluticasone) nasal spray 1 spray per nostril twice a day as needed for nasal congestion.  ?Nasal saline spray (i.e., Simply Saline) or nasal saline lavage (i.e., NeilMed) is recommended as needed and prior to medicated nasal sprays. ? ?Asthma: ?Daily controller medication(s): START Armonair 1 puff twice a day and rinse mouth after each use. Sample given of the higher dose - will send a prescription of the lower dose. ?Demonstrated proper use.  ? ?May use albuterol rescue inhaler 2 puffs every 4 to 6 hours as needed for shortness of breath, chest tightness, coughing, and wheezing. May use albuterol rescue inhaler 2 puffs 5 to 15 minutes prior to strenuous physical activities. Monitor frequency of use.  ?Asthma control goals:  ?Full participation in all desired activities (may need albuterol before activity) ?Albuterol use two times or less a week on average (not counting use with activity) ?Cough interfering with sleep two times or less a month ?Oral steroids no more than once a year ?No hospitalizations ? ?Rash - dyshidrotic eczema. ?Continue proper skin care. ?START opzelura twice a day on the hands. Samples given. ?If it works well let me know and will send in a prescription.  ? ?Food: ?2022 skin testing showed: Positive to peanuts, hazelnut and borderline to soy. Negative to avocado. ?Continue to avoid foods that bother you - fresh fruits/vegetables, avocados, peanuts, tree nuts.  ?For mild symptoms you can take over the counter antihistamines  such as Benadryl and monitor symptoms closely. If symptoms worsen or if you have severe symptoms including breathing issues, throat closure, significant swelling, whole body hives, severe diarrhea and vomiting, lightheadedness then inject epinephrine and seek immediate medical care afterwards. ?Action plan in place.  ? ?Follow up in 2 months or sooner if needed.  ? ?Skin care recommendations ? ?Bath time: ?Always use lukewarm water. AVOID very hot or cold water. ?Keep bathing time to 5-10 minutes. ?Do NOT use bubble bath. ?Use a mild soap and use just enough to wash the dirty areas. ?Do NOT scrub skin vigorously.  ?After bathing, pat dry your skin with a towel. Do NOT rub or scrub the skin. ? ?Moisturizers and prescriptions:  ?ALWAYS apply moisturizers immediately after bathing (within 3 minutes). This helps to lock-in moisture. ?Use the moisturizer several times a day over the whole body. ?Good summer moisturizers include: Aveeno, CeraVe, Cetaphil. ?Good winter moisturizers include: Aquaphor, Vaseline, Cerave, Cetaphil, Eucerin, Vanicream. ?When using moisturizers along with medications, the moisturizer should be applied about one hour after applying the medication to prevent diluting effect of the medication or moisturize around where you applied the medications. When not using medications, the moisturizer can be continued twice daily as maintenance. ? ?Laundry and clothing: ?Avoid laundry products with added color or perfumes. ?Use unscented hypo-allergenic laundry products such as Tide free, Cheer free & gentle, and All free and clear.  ?If the skin still seems dry or sensitive, you can try double-rinsing the clothes. ?Avoid tight or scratchy clothing such as wool. ?Do not use fabric softeners or dyer sheets. ? ?Reducing Pollen Exposure ?Pollen seasons: trees (spring), grass (summer) and ragweed/weeds (  fall). ?Keep windows closed in your home and car to lower pollen exposure.  ?Install air conditioning in the  bedroom and throughout the house if possible.  ?Avoid going out in dry windy days - especially early morning. ?Pollen counts are highest between 5 - 10 AM and on dry, hot and windy days.  ?Save outside activities for late afternoon or after a heavy rain, when pollen levels are lower.  ?Avoid mowing of grass if you have grass pollen allergy. ?Be aware that pollen can also be transported indoors on people and pets.  ?Dry your clothes in an automatic dryer rather than hanging them outside where they might collect pollen.  ?Rinse hair and eyes before bedtime. ?Mold Control ?Mold and fungi can grow on a variety of surfaces provided certain temperature and moisture conditions exist.  ?Outdoor molds grow on plants, decaying vegetation and soil. The major outdoor mold, Alternaria and Cladosporium, are found in very high numbers during hot and dry conditions. Generally, a late summer - fall peak is seen for common outdoor fungal spores. Rain will temporarily lower outdoor mold spore count, but counts rise rapidly when the rainy period ends. ?The most important indoor molds are Aspergillus and Penicillium. Dark, humid and poorly ventilated basements are ideal sites for mold growth. The next most common sites of mold growth are the bathroom and the kitchen. ?Outdoor (Seasonal) Mold Control ?Use air conditioning and keep windows closed. ?Avoid exposure to decaying vegetation. ?Avoid leaf raking. ?Avoid grain handling. ?Consider wearing a face mask if working in moldy areas.  ?Indoor (Perennial) Mold Control  ?Maintain humidity below 50%. ?Get rid of mold growth on hard surfaces with water, detergent and, if necessary, 5% bleach (do not mix with other cleaners). Then dry the area completely. If mold covers an area more than 10 square feet, consider hiring an indoor environmental professional. ?For clothing, washing with soap and water is best. If moldy items cannot be cleaned and dried, throw them away. ?Remove sources e.g.  contaminated carpets. ?Repair and seal leaking roofs or pipes. Using dehumidifiers in damp basements may be helpful, but empty the water and clean units regularly to prevent mildew from forming. All rooms, especially basements, bathrooms and kitchens, require ventilation and cleaning to deter mold and mildew growth. Avoid carpeting on concrete or damp floors, and storing items in damp areas. ?Control of House Dust Mite Allergen ?Dust mite allergens are a common trigger of allergy and asthma symptoms. While they can be found throughout the house, these microscopic creatures thrive in warm, humid environments such as bedding, upholstered furniture and carpeting. ?Because so much time is spent in the bedroom, it is essential to reduce mite levels there.  ?Encase pillows, mattresses, and box springs in special allergen-proof fabric covers or airtight, zippered plastic covers.  ?Bedding should be washed weekly in hot water (130? F) and dried in a hot dryer. Allergen-proof covers are available for comforters and pillows that can?t be regularly washed.  ?Wash the allergy-proof covers every few months. Minimize clutter in the bedroom. Keep pets out of the bedroom.  ?Keep humidity less than 50% by using a dehumidifier or air conditioning. You can buy a humidity measuring device called a hygrometer to monitor this.  ?If possible, replace carpets with hardwood, linoleum, or washable area rugs. If that's not possible, vacuum frequently with a vacuum that has a HEPA filter. ?Remove all upholstered furniture and non-washable window drapes from the bedroom. ?Remove all non-washable stuffed toys from the bedroom.  Wash  stuffed toys weekly. ?Pet Allergen Avoidance: ?Contrary to popular opinion, there are no ?hypoallergenic? breeds of dogs or cats. That is because people are not allergic to an animal?s hair, but to an allergen found in the animal's saliva, dander (dead skin flakes) or urine. Pet allergy symptoms typically occur  within minutes. For some people, symptoms can build up and become most severe 8 to 12 hours after contact with the animal. People with severe allergies can experience reactions in public places if dander has bee

## 2022-03-16 ENCOUNTER — Telehealth: Payer: Self-pay

## 2022-03-16 MED ORDER — FLUTICASONE PROPIONATE HFA 110 MCG/ACT IN AERO
2.0000 | INHALATION_SPRAY | Freq: Two times a day (BID) | RESPIRATORY_TRACT | 3 refills | Status: DC
Start: 2022-03-16 — End: 2022-07-12

## 2022-03-16 NOTE — Addendum Note (Signed)
Addended by: Garnet Sierras on: 03/16/2022 04:30 PM ? ? Modules accepted: Orders ? ?

## 2022-03-16 NOTE — Telephone Encounter (Signed)
Please call patient and make her aware that she has try to [facility] first. ? ?She can still use the sample inhaler.  ? ?Then start Flovent 143mg 2 puffs twice a day and rinse mouth after each use.  ?

## 2022-03-16 NOTE — Telephone Encounter (Addendum)
Pa was denied pt has to try and fail flovent hfa or diskus first  ?

## 2022-03-16 NOTE — Telephone Encounter (Signed)
Patient is dealing with an emergency and would like a call back tomorrow about her inhaler.  ?

## 2022-03-16 NOTE — Telephone Encounter (Signed)
Pa submitted thru cover my meds for armonair per [facility] waiting on results from insurance  ?

## 2022-03-17 NOTE — Telephone Encounter (Signed)
Called and informed patient of Flovent being sent in, patient verbalized understanding.  ?

## 2022-03-22 ENCOUNTER — Emergency Department (HOSPITAL_BASED_OUTPATIENT_CLINIC_OR_DEPARTMENT_OTHER): Payer: BC Managed Care – PPO | Admitting: Radiology

## 2022-03-22 ENCOUNTER — Other Ambulatory Visit: Payer: Self-pay

## 2022-03-22 ENCOUNTER — Encounter (HOSPITAL_BASED_OUTPATIENT_CLINIC_OR_DEPARTMENT_OTHER): Payer: Self-pay

## 2022-03-22 ENCOUNTER — Emergency Department (HOSPITAL_BASED_OUTPATIENT_CLINIC_OR_DEPARTMENT_OTHER)
Admission: EM | Admit: 2022-03-22 | Discharge: 2022-03-23 | Disposition: A | Discharge status: Home / Self Care | Payer: BC Managed Care – PPO | Attending: Emergency Medicine | Admitting: Emergency Medicine

## 2022-03-22 DIAGNOSIS — R0789 Other chest pain: Secondary | ICD-10-CM | POA: Diagnosis not present

## 2022-03-22 DIAGNOSIS — J45909 Unspecified asthma, uncomplicated: Secondary | ICD-10-CM | POA: Insufficient documentation

## 2022-03-22 DIAGNOSIS — R519 Headache, unspecified: Secondary | ICD-10-CM | POA: Insufficient documentation

## 2022-03-22 DIAGNOSIS — Z7951 Long term (current) use of inhaled steroids: Secondary | ICD-10-CM | POA: Insufficient documentation

## 2022-03-22 DIAGNOSIS — Z77120 Contact with and (suspected) exposure to mold (toxic): Secondary | ICD-10-CM | POA: Insufficient documentation

## 2022-03-22 DIAGNOSIS — H938X2 Other specified disorders of left ear: Secondary | ICD-10-CM | POA: Insufficient documentation

## 2022-03-22 DIAGNOSIS — M542 Cervicalgia: Secondary | ICD-10-CM | POA: Insufficient documentation

## 2022-03-22 LAB — BASIC METABOLIC PANEL
Anion gap: 10 (ref 5–15)
BUN: 6 mg/dL (ref 6–20)
CO2: 24 mmol/L (ref 22–32)
Calcium: 8.8 mg/dL — ABNORMAL LOW (ref 8.9–10.3)
Chloride: 104 mmol/L (ref 98–111)
Creatinine, Ser: 0.48 mg/dL (ref 0.44–1.00)
GFR, Estimated: >60 mL/min (ref >60)
Glucose, Bld: 100 mg/dL — ABNORMAL HIGH (ref 70–99)
Potassium: 3.9 mmol/L (ref 3.5–5.1)
Sodium: 138 mmol/L (ref 135–145)

## 2022-03-22 LAB — CBC
HCT: 35.5 % — ABNORMAL LOW (ref 36.0–46.0)
Hemoglobin: 11.3 g/dL — ABNORMAL LOW (ref 12.0–15.0)
MCH: 25.3 pg — ABNORMAL LOW (ref 26.0–34.0)
MCHC: 31.8 g/dL (ref 30.0–36.0)
MCV: 79.4 fL — ABNORMAL LOW (ref 80.0–100.0)
Platelets: 382 10*3/uL (ref 150–400)
RBC: 4.47 MIL/uL (ref 3.87–5.11)
RDW: 15.4 % (ref 11.5–15.5)
WBC: 5.7 10*3/uL (ref 4.0–10.5)
nRBC: 0 % (ref 0.0–0.2)

## 2022-03-22 MED ORDER — DIPHENHYDRAMINE HCL 50 MG/ML IJ SOLN
25.0000 mg | Freq: Once | INTRAMUSCULAR | Status: AC
  Administered 2022-03-22: 25 mg via INTRAVENOUS
  Filled 2022-03-22: qty 1

## 2022-03-22 MED ORDER — SODIUM CHLORIDE 0.9 % IV BOLUS
1000.0000 mL | Freq: Once | INTRAVENOUS | Status: AC
  Administered 2022-03-22: 1000 mL via INTRAVENOUS

## 2022-03-22 MED ORDER — KETOROLAC TROMETHAMINE 15 MG/ML IJ SOLN
15.0000 mg | Freq: Once | INTRAMUSCULAR | Status: AC
  Administered 2022-03-22: 15 mg via INTRAVENOUS
  Filled 2022-03-22: qty 1

## 2022-03-22 MED ORDER — DEXAMETHASONE SODIUM PHOSPHATE 10 MG/ML IJ SOLN
10.0000 mg | Freq: Once | INTRAMUSCULAR | Status: AC
  Administered 2022-03-22: 10 mg via INTRAVENOUS
  Filled 2022-03-22: qty 1

## 2022-03-22 MED ORDER — METOCLOPRAMIDE HCL 5 MG/ML IJ SOLN
10.0000 mg | Freq: Once | INTRAMUSCULAR | Status: DC
  Filled 2022-03-22: qty 2

## 2022-03-22 MED ORDER — ACETAMINOPHEN 325 MG PO TABS
650.0000 mg | ORAL_TABLET | Freq: Once | ORAL | Status: AC
  Administered 2022-03-22: 650 mg via ORAL
  Filled 2022-03-22: qty 2

## 2022-03-22 NOTE — ED Triage Notes (Signed)
Pt arrives POV with her fiance. ? ?Pt states recently moved out of her home a couple of days ago that had black mold. ? ?For the past month she has not been feeling well. ? ?States she has had chest pain/heaviness, headaches, sore throat, left ear pain, body aches, neck pain, rash/scabs on head, rash on hands, neck, torso, arms and legs, breaking out in hives. ? ?Pt alert and oriented in NAD during triage. ?

## 2022-03-22 NOTE — ED Provider Notes (Signed)
?[facility] EMERGENCY DEPT ?Provider Note ? ? ?CSN: [id number] ?Arrival date & time: 03/22/22  2011 ? ?  ? ?History ? ?Chief Complaint  ?Patient presents with  ? Multiple complaints  ? ? ?Kendra Patton is a 36 y.o. female with past medical history significant for asthma, allergies, eczema who presents with concern for congestion, general malaise, headache, sore throat, chest congestion, rash, hives.  Patient reports that she was living in a house with black mold.  Patient reports history of ADHD, did not take her Adderall today.  She denies chest pain at rest, reports more of an itchy, tight sensation.  She reports that she has had some intermittent shortness of breath.  She denies vision changes, numbness, weakness of her extremities.  She denies nausea, vomiting, diarrhea other than chronic nausea on waking that she has had for many years. ? ?HPI ? ?  ? ?Home Medications ?Prior to Admission medications   ?Medication Sig Start Date End Date Taking? Authorizing Provider  ?albuterol (VENTOLIN HFA) 108 (90 Base) MCG/ACT inhaler Inhale 2 puffs into the lungs every 4 (four) hours as needed for wheezing or shortness of breath (coughing fits). 03/11/22  Yes Garnet Sierras, DO  ?amphetamine-dextroamphetamine (ADDERALL XR) 30 MG 24 hr capsule Take 1 capsule (30 mg total) by mouth every morning. 06/01/21  Yes Kennyth Arnold, FNP  ?fluticasone (FLONASE) 50 MCG/ACT nasal spray Place 1 spray into both nostrils 2 (two) times daily as needed for allergies or rhinitis. 05/26/21  Yes Garnet Sierras, DO  ?fluticasone (FLOVENT HFA) 110 MCG/ACT inhaler Inhale 2 puffs into the lungs in the morning and at bedtime. Rinse mouth after each use. 03/16/22  Yes Garnet Sierras, DO  ?buPROPion (WELLBUTRIN XL) 300 MG 24 hr tablet Take 1 tablet (300 mg total) by mouth daily. ?Patient not taking: Reported on 03/11/2022 05/20/21   Allwardt, Randa Evens, PA-C  ?cetirizine (ZYRTEC ALLERGY) 10 MG tablet Take 1 tablet (10 mg total) by mouth  daily. ?Patient not taking: Reported on 03/11/2022 05/26/21   Garnet Sierras, DO  ?clonazePAM (KLONOPIN) 0.5 MG tablet Take 0.5 tablets (0.25 mg total) by mouth 2 (two) times daily as needed for anxiety. 05/12/21 06/11/21  Marin Olp, MD  ?EPINEPHrine (AUVI-Q) 0.3 mg/0.3 mL IJ SOAJ injection Inject 0.3 mg into the muscle as needed for anaphylaxis. 05/26/21   Garnet Sierras, DO  ?ferrous sulfate 325 (65 FE) MG EC tablet Take 1 tablet (325 mg total) by mouth daily with breakfast. ?Patient not taking: Reported on 03/11/2022 01/05/21   Brunetta Jeans, PA-C  ?metroNIDAZOLE (METROGEL) 0.75 % gel Apply one application twice per week to prevent BV recurrence ?Patient not taking: Reported on 03/22/2022 04/28/21   Inda Coke, PA  ?spironolactone (ALDACTONE) 25 MG tablet Take 50 mg by mouth daily. ?Patient not taking: Reported on 03/11/2022    [provider]  ?Vitamin D, Ergocalciferol, (DRISDOL) 1.25 MG (50000 UNIT) CAPS capsule Take once capsule by mouth once a week for 12 weeks 01/05/21   Brunetta Jeans, PA-C  ?   ? ?Allergies    ?Aspirin, Compazine [prochlorperazine edisylate], Other, and Penicillins   ? ?Review of Systems   ?Review of Systems  ?HENT:  Positive for congestion, ear pain and sore throat.   ?Respiratory:  Positive for cough and chest tightness.   ?Cardiovascular:  Negative for chest pain.  ?Neurological:  Positive for headaches.  ?All other systems reviewed and are negative. ? ?Physical Exam ?Updated Vital Signs ?  BP 131/76   Pulse 98   Temp 99.4 ?F (37.4 ?C)   Resp 18   Ht '5\' 7"'$  (1.702 m)   Wt 81.6 kg   LMP 03/20/2022 (Exact Date)   SpO2 100%   BMI 28.19 kg/m?  ?Physical Exam ?Vitals and nursing note reviewed.  ?Constitutional:   ?   General: She is not in acute distress. ?   Appearance: Normal appearance.  ?HENT:  ?   Head: Normocephalic and atraumatic.  ?   Left Ear: Tympanic membrane, ear canal and external ear normal. There is no impacted cerumen.  ?   Nose:  ?   Comments: Congestion  noted, no unilateral sinus tenderness to palpation. ?Eyes:  ?   General:     ?   Right eye: No discharge.     ?   Left eye: No discharge.  ?Cardiovascular:  ?   Rate and Rhythm: Normal rate and regular rhythm.  ?   Heart sounds: No murmur heard. ?  No friction rub. No gallop.  ?   Comments: No tenderness to palpation of the chest wall ?Pulmonary:  ?   Effort: Pulmonary effort is normal.  ?   Breath sounds: Normal breath sounds.  ?   Comments: Clear lung sounds throughout, no evidence of wheezing, focal consolidation.  No respiratory distress noted. ?Abdominal:  ?   General: Bowel sounds are normal.  ?   Palpations: Abdomen is soft.  ?   Comments: No significant tenderness palpation of the abdomen.  ?Skin: ?   General: Skin is warm and dry.  ?   Capillary Refill: Capillary refill takes less than 2 seconds.  ?Neurological:  ?   Mental Status: She is alert and oriented to person, place, and time.  ?   Comments: Cranial nerves II through XII grossly intact.  Intact finger-nose, intact heel-to-shin.  Romberg negative, gait normal.  Alert and oriented x3.  Moves all 4 limbs spontaneously, normal coordination.  No pronator drift.  Intact strength 5 out of 5 bilateral upper and lower extremities. ? ?  ?Psychiatric:     ?   Mood and Affect: Mood normal.     ?   Behavior: Behavior normal.  ? ? ?ED Results / Procedures / Treatments   ?Labs ?(all labs ordered are listed, but only abnormal results are displayed) ?Labs Reviewed  ?CBC - Abnormal; Notable for the following components:  ?    Result Value  ? Hemoglobin 11.3 (*)   ? HCT 35.5 (*)   ? MCV 79.4 (*)   ? MCH 25.3 (*)   ? All other components within normal limits  ?BASIC METABOLIC PANEL - Abnormal; Notable for the following components:  ? Glucose, Bld 100 (*)   ? Calcium 8.8 (*)   ? All other components within normal limits  ? ? ?EKG ?EKG Interpretation ? ?Date/Time:  Monday March 22 2022 20:27:56 EDT ?Ventricular Rate:  101 ?PR Interval:  132 ?QRS Duration: 78 ?QT  Interval:  346 ?QTC Calculation: 448 ?R Axis:   73 ?Text Interpretation: Sinus tachycardia Otherwise normal ECG When compared with ECG of 13-Feb-2022 13:59, PREVIOUS ECG IS PRESENT since last tracing no significant change Confirmed by Malvin Johns [phone]) on 03/22/2022 10:27:00 PM ? ?Radiology ?DG Chest 2 View ? ?Result Date: 03/22/2022 ?CLINICAL DATA:  Dyspnea EXAM: CHEST - 2 VIEW COMPARISON:  None. FINDINGS: The heart size and mediastinal contours are within normal limits. Both lungs are clear. The visualized skeletal structures are unremarkable. IMPRESSION:  No active cardiopulmonary disease. Electronically Signed   By: Fidela Salisbury M.D.   On: 03/22/2022 21:59   ? ?Procedures ?Procedures  ? ? ?Medications Ordered in ED ?Medications  ?metoCLOPramide (REGLAN) injection 10 mg (10 mg Intravenous Not Given 03/22/22 2234)  ?sodium chloride 0.9 % bolus 1,000 mL (1,000 mLs Intravenous New Bag/Given 03/22/22 2216)  ?ketorolac (TORADOL) 15 MG/ML injection 15 mg (15 mg Intravenous Given 03/22/22 2218)  ?acetaminophen (TYLENOL) tablet 650 mg (650 mg Oral Given 03/22/22 2217)  ?diphenhydrAMINE (BENADRYL) injection 25 mg (25 mg Intravenous Given 03/22/22 2223)  ?dexamethasone (DECADRON) injection 10 mg (10 mg Intravenous Given 03/22/22 2219)  ? ? ?ED Course/ Medical Decision Making/ A&P ?  ?                        ?Medical Decision Making ?Amount and/or Complexity of Data Reviewed ?Labs: ordered. ? ? ?This patient presents to the ED for concern of constellation of symptoms including some chest tightness, body aches, neck pain, worsening eczema, rash on arms, legs, head, hives, headaches, sore throat, this involves an extensive number of treatment options, and is a complaint that carries with it a high risk of complications and morbidity. The emergent differential diagnosis prior to evaluation includes, but is not limited to, pneumonia, asthma exacerbation, allergen exposure, eczema, SJS, TEN, acute intracranial abnormality including  stroke, hemorrhage, brain tumor, meningitis, less clinical concern for ACS, PE, pericarditis, acute aortic dissection, aortic aneurysm versus other. ? ?To clarify patient's arrival complaint of chest pain she endorses some chest tightness,

## 2022-03-22 NOTE — Discharge Instructions (Signed)
As we discussed I think that you have had an exacerbation of your allergies, as well as your eczema secondary to the mold exposure in your home.  I recommend that you use some Flonase, Mucinex to help with your nasal congestion.  If you feel like your nasal congestion is not improving you can additionally use something like a Nettie pot for sinus rinse.  Recommend that you take some allergy medication once daily such as Allegra, Zyrtec, Xyzal.  You can take Benadryl as needed for breakthrough symptoms. ? ?Please use Tylenol or ibuprofen for pain, headache.  You may use 600 mg ibuprofen every 6 hours or 1000 mg of Tylenol every 6 hours.  You may choose to alternate between the 2.  This would be most effective.  Not to exceed 4 g of Tylenol within 24 hours.  Not to exceed 3200 mg ibuprofen 24 hours. ? ? ?If your symptoms worsen despite treatment please return to the emergency department for further evaluation.  It was a pleasure taking care of you today, I hope that you feel better soon. ?

## 2022-05-11 ENCOUNTER — Ambulatory Visit: Payer: BC Managed Care – PPO | Admitting: Family Medicine

## 2022-06-06 ENCOUNTER — Encounter (HOSPITAL_BASED_OUTPATIENT_CLINIC_OR_DEPARTMENT_OTHER): Payer: Self-pay | Admitting: Emergency Medicine

## 2022-06-06 ENCOUNTER — Emergency Department (HOSPITAL_BASED_OUTPATIENT_CLINIC_OR_DEPARTMENT_OTHER)
Admission: EM | Admit: 2022-06-06 | Discharge: 2022-06-06 | Disposition: A | Discharge status: Home / Self Care | Payer: BC Managed Care – PPO | Attending: Emergency Medicine | Admitting: Emergency Medicine

## 2022-06-06 ENCOUNTER — Other Ambulatory Visit: Payer: Self-pay

## 2022-06-06 DIAGNOSIS — H9202 Otalgia, left ear: Secondary | ICD-10-CM

## 2022-06-06 DIAGNOSIS — R42 Dizziness and giddiness: Secondary | ICD-10-CM | POA: Diagnosis not present

## 2022-06-06 LAB — CBC WITH DIFFERENTIAL/PLATELET
Abs Immature Granulocytes: 0.01 10*3/uL (ref 0.00–0.07)
Basophils Absolute: 0.1 10*3/uL (ref 0.0–0.1)
Basophils Relative: 1 %
Eosinophils Absolute: 0.3 10*3/uL (ref 0.0–0.5)
Eosinophils Relative: 5 %
HCT: 37.8 % (ref 36.0–46.0)
Hemoglobin: 12 g/dL (ref 12.0–15.0)
Immature Granulocytes: 0 %
Lymphocytes Relative: 41 %
Lymphs Abs: 2.5 10*3/uL (ref 0.7–4.0)
MCH: 25.2 pg — ABNORMAL LOW (ref 26.0–34.0)
MCHC: 31.7 g/dL (ref 30.0–36.0)
MCV: 79.2 fL — ABNORMAL LOW (ref 80.0–100.0)
Monocytes Absolute: 0.5 10*3/uL (ref 0.1–1.0)
Monocytes Relative: 8 %
Neutro Abs: 2.8 10*3/uL (ref 1.7–7.7)
Neutrophils Relative %: 45 %
Platelets: 361 10*3/uL (ref 150–400)
RBC: 4.77 MIL/uL (ref 3.87–5.11)
RDW: 15.4 % (ref 11.5–15.5)
WBC: 6.1 10*3/uL (ref 4.0–10.5)
nRBC: 0 % (ref 0.0–0.2)

## 2022-06-06 LAB — BASIC METABOLIC PANEL
Anion gap: 10 (ref 5–15)
BUN: 10 mg/dL (ref 6–20)
CO2: 27 mmol/L (ref 22–32)
Calcium: 9.6 mg/dL (ref 8.9–10.3)
Chloride: 101 mmol/L (ref 98–111)
Creatinine, Ser: 0.53 mg/dL (ref 0.44–1.00)
GFR, Estimated: >60 mL/min (ref >60)
Glucose, Bld: 91 mg/dL (ref 70–99)
Potassium: 4.2 mmol/L (ref 3.5–5.1)
Sodium: 138 mmol/L (ref 135–145)

## 2022-06-06 MED ORDER — MECLIZINE HCL 25 MG PO TABS
25.0000 mg | ORAL_TABLET | Freq: Once | ORAL | Status: AC
  Administered 2022-06-06: 25 mg via ORAL
  Filled 2022-06-06: qty 1

## 2022-06-06 NOTE — ED Triage Notes (Signed)
Pt has been having left ear pressure /pain for 6 months, evaluated by primary, and urgent cares with no resolution or diagnosis, started last night with extreme vertigo/ does have some nasal drainage and sore throat. Pt reports her left ear pops /pressure constantly.

## 2022-06-06 NOTE — Discharge Instructions (Signed)
Try Sudafed over-the-counter for your nasal congestion and ear congestion.  You may find this may help your dizziness.  Follow-up with ENT as written you will need to call to make an appointment.  Follow-up with your doctor within the week as well.

## 2022-06-06 NOTE — ED Provider Notes (Signed)
[facility] DE[facility] Provider Note   CSN: [id number] Arrival date & time: 06/06/22  8099     History  Chief Complaint  Patient presents with   Dizziness    Kendra Patton is a 36 y.o. female.  Patient presents ER chief complaint of fullness sensation to the left ear and lightheadedness dizziness today.  She states that she stood up and felt the room was spinning and twisting.  Symptoms was intermittent for the next hour or so but has currently resolved.  Denies any headache or chest pain denies vomiting cough or diarrhea complains of nasal congestion and left ear fullness sensation.       Home Medications Prior to Admission medications   Medication Sig Start Date End Date Taking? Authorizing Provider  hydrOXYzine (ATARAX) 10 MG tablet Take 10 mg by mouth at bedtime.   Yes [provider]  albuterol (VENTOLIN HFA) 108 (90 Base) MCG/ACT inhaler Inhale 2 puffs into the lungs every 4 (four) hours as needed for wheezing or shortness of breath (coughing fits). 03/11/22   Garnet Sierras, DO  amphetamine-dextroamphetamine (ADDERALL XR) 30 MG 24 hr capsule Take 1 capsule (30 mg total) by mouth every morning. 06/01/21   Kennyth Arnold, FNP  buPROPion (WELLBUTRIN XL) 300 MG 24 hr tablet Take 1 tablet (300 mg total) by mouth daily. Patient not taking: Reported on 03/11/2022 05/20/21   Allwardt, Randa Evens, PA-C  cetirizine (ZYRTEC ALLERGY) 10 MG tablet Take 1 tablet (10 mg total) by mouth daily. Patient not taking: Reported on 03/11/2022 05/26/21   Garnet Sierras, DO  clonazePAM (KLONOPIN) 0.5 MG tablet Take 0.5 tablets (0.25 mg total) by mouth 2 (two) times daily as needed for anxiety. 05/12/21 06/11/21  Marin Olp, MD  EPINEPHrine (AUVI-Q) 0.3 mg/0.3 mL IJ SOAJ injection Inject 0.3 mg into the muscle as needed for anaphylaxis. 05/26/21   Garnet Sierras, DO  ferrous sulfate 325 (65 FE) MG EC tablet Take 1 tablet (325 mg total) by mouth daily with breakfast. Patient not  taking: Reported on 03/11/2022 01/05/21   Brunetta Jeans, PA-C  fluticasone [facility]) 50 MCG/ACT nasal spray Place 1 spray into both nostrils 2 (two) times daily as needed for allergies or rhinitis. 05/26/21   Garnet Sierras, DO  fluticasone (FLOVENT HFA) 110 MCG/ACT inhaler Inhale 2 puffs into the lungs in the morning and at bedtime. Rinse mouth after each use. 03/16/22   Garnet Sierras, DO  metroNIDAZOLE (METROGEL) 0.75 % gel Apply one application twice per week to prevent BV recurrence Patient not taking: Reported on 03/22/2022 04/28/21   Inda Coke, PA  spironolactone (ALDACTONE) 25 MG tablet Take 50 mg by mouth daily. Patient not taking: Reported on 03/11/2022    [provider]  Vitamin D, Ergocalciferol, (DRISDOL) 1.25 MG (50000 UNIT) CAPS capsule Take once capsule by mouth once a week for 12 weeks 01/05/21   Brunetta Jeans, PA-C      Allergies    Aspirin, Compazine [prochlorperazine edisylate], Other, and Penicillins    Review of Systems   Review of Systems  Constitutional:  Negative for fever.  HENT:  Negative for ear pain.   Eyes:  Negative for pain.  Respiratory:  Negative for cough.   Cardiovascular:  Negative for chest pain.  Gastrointestinal:  Negative for abdominal pain.  Genitourinary:  Negative for flank pain.  Musculoskeletal:  Negative for back pain.  Skin:  Negative for rash.  Neurological:  Negative for headaches.  Physical Exam Updated Vital Signs BP (!) 152/89 (BP Location: Right Arm)   Pulse 77   Temp 98.3 F (36.8 C)   Resp 17   SpO2 100%  Physical Exam Constitutional:      General: She is not in acute distress.    Appearance: Normal appearance.  HENT:     Head: Normocephalic.     Nose: Nose normal.  Eyes:     Extraocular Movements: Extraocular movements intact.  Cardiovascular:     Rate and Rhythm: Normal rate.  Pulmonary:     Effort: Pulmonary effort is normal.  Musculoskeletal:        General: Normal range of motion.     Cervical  back: Normal range of motion.  Neurological:     General: No focal deficit present.     Mental Status: She is alert and oriented to person, place, and time. Mental status is at baseline.     Cranial Nerves: No cranial nerve deficit.     Motor: No weakness.     Gait: Gait normal.     Comments: Finger-nose and heel-to-shin are intact.  Gait is normal without assistance.     ED Results / Procedures / Treatments   Labs (all labs ordered are listed, but only abnormal results are displayed) Labs Reviewed  CBC WITH DIFFERENTIAL/PLATELET - Abnormal; Notable for the following components:      Result Value   MCV 79.2 (*)    MCH 25.2 (*)    All other components within normal limits  BASIC METABOLIC PANEL    EKG None  Radiology No results found.  Procedures Procedures    Medications Ordered in ED Medications  meclizine (ANTIVERT) tablet 25 mg (25 mg Oral Given 06/06/22 1122)    ED Course/ Medical Decision Making/ A&P                           Medical Decision Making Amount and/or Complexity of Data Reviewed Labs: ordered.   History from family at bedside.  Cardiac monitoring showing sinus rhythm.  Review of records shows visit March 22, 2022 for mold exposure concern.  Sinus studies today unremarkable white count normal chemistry normal sodium is normal.  EKG shows sinus rhythm no ST elevations or depressions no T wave versions noted.  Patient remains asymptomatic at this time with normal neuro exam normal gait.  Evaluation of bilateral tympanic membranes show fullness but no erythema no infection.  Recommending Sudafed at home, meclizine, advised outpatient follow-up with ENT and phone number provided for the patient to call.  Advised immediate return for worsening symptoms new numbness weakness or fevers, otherwise advised follow-up with ENT as dictated.        Final Clinical Impression(s) / ED Diagnoses Final diagnoses:  Vertigo  Ear pain, left    Rx / DC  Orders ED Discharge Orders     None         Luna Fuse, MD 06/06/22 1239

## 2022-06-29 ENCOUNTER — Ambulatory Visit (INDEPENDENT_AMBULATORY_CARE_PROVIDER_SITE_OTHER): Payer: BC Managed Care – PPO

## 2022-06-29 ENCOUNTER — Ambulatory Visit (INDEPENDENT_AMBULATORY_CARE_PROVIDER_SITE_OTHER): Payer: BC Managed Care – PPO | Admitting: Orthopaedic Surgery

## 2022-06-29 ENCOUNTER — Encounter: Payer: Self-pay | Admitting: Orthopaedic Surgery

## 2022-06-29 DIAGNOSIS — M25551 Pain in right hip: Secondary | ICD-10-CM

## 2022-06-29 DIAGNOSIS — G8929 Other chronic pain: Secondary | ICD-10-CM

## 2022-06-29 DIAGNOSIS — M25561 Pain in right knee: Secondary | ICD-10-CM

## 2022-06-29 NOTE — Progress Notes (Signed)
Office Visit Note   Patient: Kendra Patton           Date of Birth: 22-Apr-1986           MRN: [id number] Visit Date: 06/29/2022              Requested by: Allwardt, Randa Evens, PA-C [place],  [place] [address] PCP: Fredirick Lathe, PA-C   Assessment & Plan: Visit Diagnoses:  1. Pain in right hip   2. Chronic pain of right knee     Plan: Based on findings I do not see really anything structural or surgical.  I have considered fibromyalgia or some variant of chronic pain syndrome.  I think the anxiety contributes to it as well.  I think best thing would be to send her to physical therapy for these conditions.  Recommend that she can increase dosage of ibuprofen.  Could consider MRI down the road if she continues to have pain in the knee.  Follow-Up Instructions: No follow-ups on file.   Orders:  Orders Placed This Encounter  Procedures   XR KNEE 3 VIEW RIGHT   XR HIP UNILAT W OR W/O PELVIS 2-3 VIEWS RIGHT   Ambulatory referral to Physical Therapy   No orders of the defined types were placed in this encounter.     Procedures: No procedures performed   Clinical Data: No additional findings.   Subjective: Chief Complaint  Patient presents with   Right Knee - Pain   Right Hip - Pain    HPI Patient is a 36 year old female here with chronic right hip and knee and sometimes ankle pain.  Worse at the end of the day.  Stand 7 hours a day at work.  Has trouble sleeping due to the pain.  Has had MRIs last year of the hip and lumbar spine which were essentially unremarkable.  Positive family history for multiple sclerosis.  Review of Systems  Constitutional: Negative.   HENT: Negative.    Eyes: Negative.   Respiratory: Negative.    Cardiovascular: Negative.   Endocrine: Negative.   Musculoskeletal: Negative.   Neurological: Negative.   Hematological: Negative.   Psychiatric/Behavioral: Negative.    All other systems reviewed and are  negative.    Objective: Vital Signs: There were no vitals taken for this visit.  Physical Exam Vitals and nursing note reviewed.  Constitutional:      Appearance: She is well-developed.  HENT:     Head: Atraumatic.     Nose: Nose normal.  Eyes:     Extraocular Movements: Extraocular movements intact.  Cardiovascular:     Pulses: Normal pulses.  Pulmonary:     Effort: Pulmonary effort is normal.  Abdominal:     Palpations: Abdomen is soft.  Musculoskeletal:     Cervical back: Neck supple.  Skin:    General: Skin is warm.     Capillary Refill: Capillary refill takes less than 2 seconds.  Neurological:     Mental Status: She is alert. Mental status is at baseline.  Psychiatric:        Behavior: Behavior normal.        Thought Content: Thought content normal.        Judgment: Judgment normal.     Ortho Exam Examination of right hip and knees nonfocal.  No joint line tenderness.  Reports pain around the kneecap with bending.  No effusion.  Lateral hip is slightly tender to palpation.  Excellent range of motion.  No sciatic tension signs. Specialty Comments:  No specialty comments available.  Imaging: No results found.   PMFS History: Patient Active Problem List   Diagnosis Date Noted   Not well controlled moderate persistent asthma 03/11/2022   Other adverse food reactions, not elsewhere classified, subsequent encounter 05/26/2021   Oral allergy syndrome, subsequent encounter 05/26/2021   Dyshidrotic eczema 05/26/2021   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   Polycystic ovary syndrome 12/24/2019   Vaginal delivery 02/29/2016   Anxiety - dx'd at age 58 02/28/2016   Eczema 02/28/2016   IBS (irritable bowel syndrome) 02/28/2016   Scoliosis 02/28/2016   Seasonal and perennial allergic rhinoconjunctivitis 02/28/2016   Allergy history, drug -- Compazine (dystonic rxn; seizures) 02/27/2016   High-risk pregnancy supervision 10/27/2015   Von  Willebrand disease, type I ([place]) 07/11/2015   Aspirin allergy 07/11/2015   Past Medical History:  Diagnosis Date   Anxiety    age 88   Asthma    COVID 04/2021   Depression    Eczema    High-risk pregnancy supervision 10/27/2015   History of CMV    last pregnancy   History of OCD (obsessive compulsive disorder)    IBS (irritable bowel syndrome)    Scoliosis    Von Willebrand disease ([place])     Family History  Problem Relation Age of Onset   Asthma Mother    Hypertension Mother    Cancer Mother        Type of Skin Cancer   Diabetes Mother    Depression Mother    Heart disease Mother    Varicose Veins Mother    Multiple sclerosis Mother    Cancer Father        Skin   Deep vein thrombosis Father    Asthma Sister    Hypertension Sister    Depression Sister    Learning disabilities Sister    Varicose Veins Sister    Bronchitis Sister    Cancer Maternal Grandmother    Cancer Paternal Grandfather        lung   Asthma Sister    Depression Sister    Bronchitis Sister    Deafness Daughter    Eczema Niece    Food Allergy Niece    Bronchitis Niece    Eczema Nephew    Food Allergy Nephew    Bronchitis Nephew    Urticaria Neg Hx    Atopy Neg Hx    Angioedema Neg Hx     Past Surgical History:  Procedure Laterality Date   WISDOM TOOTH EXTRACTION     Social History   Occupational History    Comment: home maker  Tobacco Use   Smoking status: Former    Types: Cigarettes    Quit date: 2010    Years since quitting: 13.5   Smokeless tobacco: Never   Tobacco comments:    on and off socially   Vaping Use   Vaping Use: Every day  Substance and Sexual Activity   Alcohol use: Yes    Alcohol/week: 0.0 standard drinks of alcohol    Comment: 2 seltzer beers per day   Drug use: No   Sexual activity: Yes    Birth control/protection: Surgical

## 2022-07-08 ENCOUNTER — Telehealth: Payer: Self-pay | Admitting: *Deleted

## 2022-07-08 NOTE — Telephone Encounter (Addendum)
PA submitted for Flovent 110 through cover my meds. Waiting for determination.   Key: [id number]

## 2022-07-09 NOTE — Telephone Encounter (Signed)
Sent to Plan today.

## 2022-07-12 MED ORDER — PULMICORT FLEXHALER 90 MCG/ACT IN AEPB
1.0000 | INHALATION_SPRAY | Freq: Two times a day (BID) | RESPIRATORY_TRACT | 1 refills | Status: DC
Start: 2022-07-12 — End: 2022-09-10

## 2022-07-12 NOTE — Telephone Encounter (Signed)
Please call patient and let her know that her insurance is denying Flovent.  I'll send in Pulmicort 16mg 1 puff twice a day and rinse mouth after each use.  Patient is also due for follow up visit.

## 2022-07-12 NOTE — Telephone Encounter (Signed)
I think this is Dr. Julianne Rice patient. Copying her on message.

## 2022-07-12 NOTE — Telephone Encounter (Signed)
Left a detailed message about change per DPR.

## 2022-07-12 NOTE — Telephone Encounter (Signed)
Received determination letter- Flovent denied. Pt must try and fail Pulmicort Flexhaler. Please advise.

## 2022-08-24 ENCOUNTER — Ambulatory Visit
Admission: RE | Admit: 2022-08-24 | Discharge: 2022-08-24 | Disposition: A | Discharge status: Home / Self Care | Payer: BC Managed Care – PPO | Source: Ambulatory Visit | Attending: Emergency Medicine | Admitting: Emergency Medicine

## 2022-08-24 VITALS — BP 123/82 | HR 111 | Temp 98.2°F | Resp 18

## 2022-08-24 DIAGNOSIS — W57XXXA Bitten or stung by nonvenomous insect and other nonvenomous arthropods, initial encounter: Secondary | ICD-10-CM | POA: Diagnosis not present

## 2022-08-24 DIAGNOSIS — S20462A Insect bite (nonvenomous) of left back wall of thorax, initial encounter: Secondary | ICD-10-CM

## 2022-08-24 DIAGNOSIS — R52 Pain, unspecified: Secondary | ICD-10-CM | POA: Diagnosis not present

## 2022-08-24 NOTE — ED Provider Notes (Signed)
[facility]    CSN: [id number] Arrival date & time: 08/24/22  1702    HISTORY   Chief Complaint  Patient presents with   Insect Bite   Generalized Body Aches   Headache   Fatigue   HPI Kendra Patton is a pleasant, 36 y.o. female who presents to urgent care today. Patient states that she found an engorged tick under her left arm last night.  States her fianc removed it however she did not save it.  [place] and that she and her fianc often find ticks on themselves.  Patient states that she also noticed last night that her whole body was hurting and that she had neck pain.  Patient states her mother and her sister have both been diagnosed with Lyme disease and she wants to be tested today.  Patient denies fever, chills, nausea, vomiting, diarrhea, sore throat, congestion, cough, runny nose.  Patient denies skin lesion and area of tick bite.  Patient states she has not tried any medications to alleviate her symptoms.  The history is provided by the patient.   Past Medical History:  Diagnosis Date   Anxiety    age 89   Asthma    COVID 04/2021   Depression    Eczema    High-risk pregnancy supervision 10/27/2015   History of CMV    last pregnancy   History of OCD (obsessive compulsive disorder)    IBS (irritable bowel syndrome)    Scoliosis    Von Willebrand disease ([place])    Patient Active Problem List   Diagnosis Date Noted   Not well controlled moderate persistent asthma 03/11/2022   Other adverse food reactions, not elsewhere classified, subsequent encounter 05/26/2021   Oral allergy syndrome, subsequent encounter 05/26/2021   Dyshidrotic eczema 05/26/2021   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   Polycystic ovary syndrome 12/24/2019   Vaginal delivery 02/29/2016   Anxiety - dx'd at age 42 02/28/2016   Eczema 02/28/2016   IBS (irritable bowel syndrome) 02/28/2016   Scoliosis 02/28/2016   Seasonal  and perennial allergic rhinoconjunctivitis 02/28/2016   Allergy history, drug -- Compazine (dystonic rxn; seizures) 02/27/2016   High-risk pregnancy supervision 10/27/2015   Von Willebrand disease, type I ([place]) 07/11/2015   Aspirin allergy 07/11/2015   Past Surgical History:  Procedure Laterality Date   WISDOM TOOTH EXTRACTION     OB History     Gravida  2   Para  2   Term  2   Preterm      AB      Living  2      SAB      IAB      Ectopic      Multiple  0   Live Births  2          Home Medications    Prior to Admission medications   Medication Sig Start Date End Date Taking? Authorizing Provider  albuterol (VENTOLIN HFA) 108 (90 Base) MCG/ACT inhaler Inhale 2 puffs into the lungs every 4 (four) hours as needed for wheezing or shortness of breath (coughing fits). 03/11/22   Garnet Sierras, DO  amphetamine-dextroamphetamine (ADDERALL XR) 30 MG 24 hr capsule Take 1 capsule (30 mg total) by mouth every morning. 06/01/21   Kennyth Arnold, FNP  Budesonide (PULMICORT FLEXHALER) 90 MCG/ACT inhaler Inhale 1 puff into the lungs 2 (two) times daily. Rinse mouth after each use. 07/12/22   Maudie Mercury,  Albin Felling, DO  clonazePAM (KLONOPIN) 0.5 MG tablet Take 0.5 tablets (0.25 mg total) by mouth 2 (two) times daily as needed for anxiety. 05/12/21 06/11/21  Marin Olp, MD  EPINEPHrine (AUVI-Q) 0.3 mg/0.3 mL IJ SOAJ injection Inject 0.3 mg into the muscle as needed for anaphylaxis. 05/26/21   Garnet Sierras, DO  fluticasone (FLONASE) 50 MCG/ACT nasal spray Place 1 spray into both nostrils 2 (two) times daily as needed for allergies or rhinitis. 05/26/21   Garnet Sierras, DO  hydrOXYzine (ATARAX) 10 MG tablet Take 10 mg by mouth at bedtime.    [provider]  Vitamin D, Ergocalciferol, (DRISDOL) 1.25 MG (50000 UNIT) CAPS capsule Take once capsule by mouth once a week for 12 weeks 01/05/21   Brunetta Jeans, PA-C    Family History Family History  Problem Relation Age of Onset   Asthma  Mother    Hypertension Mother    Cancer Mother        Type of Skin Cancer   Diabetes Mother    Depression Mother    Heart disease Mother    Varicose Veins Mother    Multiple sclerosis Mother    Cancer Father        Skin   Deep vein thrombosis Father    Asthma Sister    Hypertension Sister    Depression Sister    Learning disabilities Sister    Varicose Veins Sister    Bronchitis Sister    Cancer Maternal Grandmother    Cancer Paternal Grandfather        lung   Asthma Sister    Depression Sister    Bronchitis Sister    Deafness Daughter    Eczema Niece    Food Allergy Niece    Bronchitis Niece    Eczema Nephew    Food Allergy Nephew    Bronchitis Nephew    Urticaria Neg Hx    Atopy Neg Hx    Angioedema Neg Hx    Social History Social History   Tobacco Use   Smoking status: Former    Types: Cigarettes    Quit date: 2010    Years since quitting: 13.6   Smokeless tobacco: Never   Tobacco comments:    on and off socially   Vaping Use   Vaping Use: Every day  Substance Use Topics   Alcohol use: Yes    Alcohol/week: 0.0 standard drinks of alcohol    Comment: 2 seltzer beers per day   Drug use: No   Allergies   Aspirin, Compazine [prochlorperazine edisylate], Other, Penicillins, and Prochlorperazine  Review of Systems Review of Systems Pertinent findings revealed after performing a 14 point review of systems has been noted in the history of present illness.  Physical Exam Triage Vital Signs ED Triage Vitals  Enc Vitals Group     BP 10/16/21 0827 (!) 147/82     Pulse Rate 10/16/21 0827 72     Resp 10/16/21 0827 18     Temp 10/16/21 0827 98.3 F (36.8 C)     Temp Source 10/16/21 0827 Oral     SpO2 10/16/21 0827 98 %     Weight --      Height --      Head Circumference --      Peak Flow --      Pain Score 10/16/21 0826 5     Pain Loc --      Pain Edu? --  Excl. in GC? --   No data found.  Updated Vital Signs BP 123/82 (BP Location: Left Arm)    Pulse (!) 111   Temp 98.2 F (36.8 C) (Oral)   Resp 18   LMP 08/11/2022   SpO2 98%   Physical Exam Vitals and nursing note reviewed.  Constitutional:      General: She is not in acute distress.    Appearance: Normal appearance. She is not ill-appearing.  HENT:     Head: Normocephalic and atraumatic.     Salivary Glands: Right salivary gland is not diffusely enlarged or tender. Left salivary gland is not diffusely enlarged or tender.     Right Ear: Tympanic membrane, ear canal and external ear normal. No drainage. No middle ear effusion. There is no impacted cerumen. Tympanic membrane is not erythematous or bulging.     Left Ear: Tympanic membrane, ear canal and external ear normal. No drainage.  No middle ear effusion. There is no impacted cerumen. Tympanic membrane is not erythematous or bulging.     Nose: Nose normal. No nasal deformity, septal deviation, mucosal edema, congestion or rhinorrhea.     Right Turbinates: Not enlarged, swollen or pale.     Left Turbinates: Not enlarged, swollen or pale.     Right Sinus: No maxillary sinus tenderness or frontal sinus tenderness.     Left Sinus: No maxillary sinus tenderness or frontal sinus tenderness.     Mouth/Throat:     Lips: Pink. No lesions.     Mouth: Mucous membranes are moist. No oral lesions.     Pharynx: Oropharynx is clear. Uvula midline. No posterior oropharyngeal erythema or uvula swelling.     Tonsils: No tonsillar exudate. 0 on the right. 0 on the left.  Eyes:     General: Lids are normal.        Right eye: No discharge.        Left eye: No discharge.     Extraocular Movements: Extraocular movements intact.     Conjunctiva/sclera: Conjunctivae normal.     Right eye: Right conjunctiva is not injected.     Left eye: Left conjunctiva is not injected.  Neck:     Trachea: Trachea and phonation normal.  Cardiovascular:     Rate and Rhythm: Normal rate and regular rhythm.     Pulses: Normal pulses.     Heart sounds:  Normal heart sounds. No murmur heard.    No friction rub. No gallop.  Pulmonary:     Effort: Pulmonary effort is normal. No accessory muscle usage, prolonged expiration or respiratory distress.     Breath sounds: Normal breath sounds. No stridor, decreased air movement or transmitted upper airway sounds. No decreased breath sounds, wheezing, rhonchi or rales.  Chest:     Chest wall: No tenderness.  Musculoskeletal:        General: Normal range of motion.     Cervical back: Normal range of motion and neck supple. Normal range of motion.  Lymphadenopathy:     Cervical: No cervical adenopathy.  Skin:    General: Skin is warm and dry.     Findings: No erythema, lesion or rash.  Neurological:     General: No focal deficit present.     Mental Status: She is alert and oriented to person, place, and time.  Psychiatric:        Mood and Affect: Mood normal.        Behavior: Behavior normal.     Visual  Acuity Right Eye Distance:   Left Eye Distance:   Bilateral Distance:    Right Eye Near:   Left Eye Near:    Bilateral Near:     UC Couse / Diagnostics / Procedures:     Radiology No results found.  Procedures Procedures (including critical care time) EKG  Pending results:  Labs Reviewed  CBC WITH DIFFERENTIAL/PLATELET  LYME DISEASE SEROLOGY W/REFLEX  SEDIMENTATION RATE    Medications Ordered in UC: Medications - No data to display  UC Diagnoses / Final Clinical Impressions(s)   I have reviewed the triage vital signs and the nursing notes.  Pertinent labs & imaging results that were available during my care of the patient were reviewed by me and considered in my medical decision making (see chart for details).    Final diagnoses:  Tick bite of left back wall of thorax, initial encounter  Generalized body aches   Physical exam today is entirely unremarkable.  Patient advised that because the tick was not engorged that there is no chance that she could have contracted  Lyme disease from this tick.  Patient is insisting on testing today.  Testing ordered at patient's request, will notify patient of negative results once received.  Patient advised to use NSAIDs for pain as needed.  ED Prescriptions   None    PDMP not reviewed this encounter.  Pending results:  Labs Reviewed  CBC WITH DIFFERENTIAL/PLATELET  LYME DISEASE SEROLOGY W/REFLEX  SEDIMENTATION RATE    Discharge Instructions:   Discharge Instructions      After looking back through the results of your previous complete blood cell counts, I see that you have a history of microcytic anemia which is usually due to iron deficiency.  I also see that you are no longer taking any iron supplements.  Please note that a lot of your symptoms can easily be related to having low iron levels.  The complete blood cell count we performed today will show whether or not you continue to be anemic.  All that being said, because you have had a tick bite and because you are requesting testing for Lyme disease, I am happy to provide that for you today.  We also performed a sed rate to measure the amount of inflammation you have your body right now.  Because the tick you found in your body was not engorged, there is no chance of transmission of Lyme disease from the tick to you.  Please speak with your primary care provider about beginning medication for presumed fibromyalgia.  This is a diagnosis of exclusion, there are no test for fibromyalgia.  Sometimes the best test is to provide treatment for presumed fibromyalgia and if it works, then you can simply keep taking the medication.  Thank you for visiting urgent care today.  We will be in touch with you soon your lab results are complete.  Sometimes this can take up to a week so please be patient.    Disposition Upon Discharge:  Condition: stable for discharge home  Patient presented with an acute illness with associated systemic symptoms and significant  discomfort requiring urgent management. In my opinion, this is a condition that a prudent lay person (someone who possesses an average knowledge of health and medicine) may potentially expect to result in complications if not addressed urgently such as respiratory distress, impairment of bodily function or dysfunction of bodily organs.   Routine symptom specific, illness specific and/or disease specific instructions were discussed with the patient  and/or caregiver at length.   As such, the patient has been evaluated and assessed, work-up was performed and treatment was provided in alignment with urgent care protocols and evidence based medicine.  Patient/parent/caregiver has been advised that the patient may require follow up for further testing and treatment if the symptoms continue in spite of treatment, as clinically indicated and appropriate.  Patient/parent/caregiver has been advised to return to the [facility] or PCP if no better; to PCP or the Emergency Department if new signs and symptoms develop, or if the current signs or symptoms continue to change or worsen for further workup, evaluation and treatment as clinically indicated and appropriate  The patient will follow up with their current PCP if and as advised. If the patient does not currently have a PCP we will assist them in obtaining one.   The patient may need specialty follow up if the symptoms continue, in spite of conservative treatment and management, for further workup, evaluation, consultation and treatment as clinically indicated and appropriate.   Patient/parent/caregiver verbalized understanding and agreement of plan as discussed.  All questions were addressed during visit.  Please see discharge instructions below for further details of plan.  This office note has been dictated using Museum/gallery curator.  Unfortunately, this method of dictation can sometimes lead to typographical or grammatical errors.  I apologize for  your inconvenience in advance if this occurs.  Please do not hesitate to reach out to me if clarification is needed.      Lynden Oxford Scales, PA-C 08/25/22 1128

## 2022-08-24 NOTE — Discharge Instructions (Addendum)
After looking back through the results of your previous complete blood cell counts, I see that you have a history of microcytic anemia which is usually due to iron deficiency.  I also see that you are no longer taking any iron supplements.  Please note that a lot of your symptoms can easily be related to having low iron levels.  The complete blood cell count we performed today will show whether or not you continue to be anemic.  All that being said, because you have had a tick bite and because you are requesting testing for Lyme disease, I am happy to provide that for you today.  We also performed a sed rate to measure the amount of inflammation you have your body right now.  Because the tick you found in your body was not engorged, there is no chance of transmission of Lyme disease from the tick to you.  Please speak with your primary care provider about beginning medication for presumed fibromyalgia.  This is a diagnosis of exclusion, there are no test for fibromyalgia.  Sometimes the best test is to provide treatment for presumed fibromyalgia and if it works, then you can simply keep taking the medication.  Thank you for visiting urgent care today.  We will be in touch with you soon your lab results are complete.  Sometimes this can take up to a week so please be patient.

## 2022-08-24 NOTE — ED Triage Notes (Signed)
The pt obtained a tick bite that she noticed last night that was embedded near her left axillary region. The patient was able to remove the tick.   Symptoms: Body aches, neck pain, headache, fatigue  Home interventions: motrin

## 2022-08-26 LAB — LYME DISEASE SEROLOGY W/REFLEX: Lyme Total Antibody EIA: NEGATIVE

## 2022-08-26 LAB — CBC WITH DIFFERENTIAL/PLATELET
Basophils Absolute: 0.1 10*3/uL (ref 0.0–0.2)
Basos: 1 %
EOS (ABSOLUTE): 0.2 10*3/uL (ref 0.0–0.4)
Eos: 3 %
Hematocrit: 37.9 % (ref 34.0–46.6)
Hemoglobin: 11.7 g/dL (ref 11.1–15.9)
Immature Grans (Abs): 0 10*3/uL (ref 0.0–0.1)
Immature Granulocytes: 0 %
Lymphocytes Absolute: 2.5 10*3/uL (ref 0.7–3.1)
Lymphs: 30 %
MCH: 24.3 pg — ABNORMAL LOW (ref 26.6–33.0)
MCHC: 30.9 g/dL — ABNORMAL LOW (ref 31.5–35.7)
MCV: 79 fL (ref 79–97)
Monocytes Absolute: 0.4 10*3/uL (ref 0.1–0.9)
Monocytes: 5 %
Neutrophils Absolute: 5.2 10*3/uL (ref 1.4–7.0)
Neutrophils: 61 %
Platelets: 431 10*3/uL (ref 150–450)
RBC: 4.82 x10E6/uL (ref 3.77–5.28)
RDW: 14.2 % (ref 11.7–15.4)
WBC: 8.5 10*3/uL (ref 3.4–10.8)

## 2022-08-26 LAB — SEDIMENTATION RATE: Sed Rate: 21 mm/hr (ref 0–32)

## 2022-08-27 ENCOUNTER — Encounter: Payer: Self-pay | Admitting: Physician Assistant

## 2022-08-27 ENCOUNTER — Ambulatory Visit (INDEPENDENT_AMBULATORY_CARE_PROVIDER_SITE_OTHER): Payer: BC Managed Care – PPO | Admitting: Physician Assistant

## 2022-08-27 VITALS — BP 135/96 | HR 124 | Temp 99.5°F | Ht 67.0 in | Wt 202.8 lb

## 2022-08-27 DIAGNOSIS — Z131 Encounter for screening for diabetes mellitus: Secondary | ICD-10-CM

## 2022-08-27 DIAGNOSIS — M129 Arthropathy, unspecified: Secondary | ICD-10-CM | POA: Diagnosis not present

## 2022-08-27 DIAGNOSIS — R000 Tachycardia, unspecified: Secondary | ICD-10-CM

## 2022-08-27 DIAGNOSIS — R52 Pain, unspecified: Secondary | ICD-10-CM | POA: Diagnosis not present

## 2022-08-27 DIAGNOSIS — R5382 Chronic fatigue, unspecified: Secondary | ICD-10-CM | POA: Diagnosis not present

## 2022-08-27 DIAGNOSIS — K921 Melena: Secondary | ICD-10-CM | POA: Diagnosis not present

## 2022-08-27 DIAGNOSIS — D508 Other iron deficiency anemias: Secondary | ICD-10-CM

## 2022-08-27 DIAGNOSIS — R030 Elevated blood-pressure reading, without diagnosis of hypertension: Secondary | ICD-10-CM

## 2022-08-27 DIAGNOSIS — Z20822 Contact with and (suspected) exposure to covid-19: Secondary | ICD-10-CM

## 2022-08-27 DIAGNOSIS — Z800 Family history of malignant neoplasm of digestive organs: Secondary | ICD-10-CM

## 2022-08-27 LAB — CBC WITH DIFFERENTIAL/PLATELET
Basophils Absolute: 0.1 10*3/uL (ref 0.0–0.1)
Basophils Relative: 1.3 % (ref 0.0–3.0)
Eosinophils Absolute: 0.1 10*3/uL (ref 0.0–0.7)
Eosinophils Relative: 2 % (ref 0.0–5.0)
HCT: 35.6 % — ABNORMAL LOW (ref 36.0–46.0)
Hemoglobin: 11.7 g/dL — ABNORMAL LOW (ref 12.0–15.0)
Lymphocytes Relative: 28.5 % (ref 12.0–46.0)
Lymphs Abs: 1.9 10*3/uL (ref 0.7–4.0)
MCHC: 33 g/dL (ref 30.0–36.0)
MCV: 74.3 fl — ABNORMAL LOW (ref 78.0–100.0)
Monocytes Absolute: 0.4 10*3/uL (ref 0.1–1.0)
Monocytes Relative: 6.3 % (ref 3.0–12.0)
Neutro Abs: 4.1 10*3/uL (ref 1.4–7.7)
Neutrophils Relative %: 61.9 % (ref 43.0–77.0)
Platelets: 402 10*3/uL — ABNORMAL HIGH (ref 150.0–400.0)
RBC: 4.79 Mil/uL (ref 3.87–5.11)
RDW: 15.3 % (ref 11.5–15.5)
WBC: 6.7 10*3/uL (ref 4.0–10.5)

## 2022-08-27 LAB — HEMOGLOBIN A1C: Hgb A1c MFr Bld: 5.6 % (ref 4.6–6.5)

## 2022-08-27 LAB — COMPREHENSIVE METABOLIC PANEL
ALT: 32 U/L (ref 0–35)
AST: 24 U/L (ref 0–37)
Albumin: 4.4 g/dL (ref 3.5–5.2)
Alkaline Phosphatase: 71 U/L (ref 39–117)
BUN: 7 mg/dL (ref 6–23)
CO2: 24 mEq/L (ref 19–32)
Calcium: 9.6 mg/dL (ref 8.4–10.5)
Chloride: 102 mEq/L (ref 96–112)
Creatinine, Ser: 0.56 mg/dL (ref 0.40–1.20)
GFR: 118.08 mL/min (ref >60.00)
Glucose, Bld: 98 mg/dL (ref 70–99)
Potassium: 4.5 mEq/L (ref 3.5–5.1)
Sodium: 137 mEq/L (ref 135–145)
Total Bilirubin: 0.3 mg/dL (ref 0.2–1.2)
Total Protein: 7.6 g/dL (ref 6.0–8.3)

## 2022-08-27 LAB — MAGNESIUM: Magnesium: 1.9 mg/dL (ref 1.5–2.5)

## 2022-08-27 LAB — VITAMIN B12: Vitamin B-12: 420 pg/mL (ref 211–911)

## 2022-08-27 LAB — VITAMIN D 25 HYDROXY (VIT D DEFICIENCY, FRACTURES): VITD: 17.35 ng/mL — ABNORMAL LOW (ref 30.00–100.00)

## 2022-08-27 LAB — TSH: TSH: 1.85 u[IU]/mL (ref 0.35–5.50)

## 2022-08-27 NOTE — Progress Notes (Unsigned)
Subjective:    Patient ID: Kendra Patton, female    DOB: 1986/02/12, 36 y.o.   MRN: [id number]  Chief Complaint  Patient presents with   Fatigue    Pt not seen since 2022 c/o fatigue and carpel tunnel, pt running low grade fever, seen at [facility] 9/5 for tick bite lyme test negative per pt;labs drawn recently as well; pt says iron is low but severe fatigue has been for 4 days; severe body aches; pt wanting referral for Colonoscopy due to having bowel issues; mucus in stool; family hx of colon cancer; also needs pap smear;     HPI Patient is in today for several concerns.  "Always having pain all over, dizzy, feeling very tired." Just in the last few days symptoms worse.  Says two different providers have told her she probably has fibromyalgia and wants to be checked for this. 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   Also feeling like it's time for a colonoscopy - intermittent pencil thin stools, black flecks in the stool. Discomfort R low abdomen. Going on for months now. Grandmother passed from colon cancer. Uncle on same side currently undergoing treatment for colon cancer.   Saw neurology last year b/c she was having involuntary twitching, some numbness on left side of face; symptoms resolved; brain MRI was negative. Wonders if it was associated with Mucinex.   ENT this morning for clicking in left ear, says they're planning on doing a brain MRI.  Past Medical History:  Diagnosis Date   Anxiety    age 52   Asthma    COVID 04/2021   Depression    Eczema    High-risk pregnancy supervision 10/27/2015   History of CMV    last pregnancy   History of OCD (obsessive compulsive disorder)    IBS (irritable bowel syndrome)    Scoliosis    Von Willebrand disease ([place])     Past Surgical History:  Procedure Laterality Date   WISDOM TOOTH EXTRACTION      Family  History  Problem Relation Age of Onset   Asthma Mother    Hypertension Mother    Cancer Mother        Type of Skin Cancer   Diabetes Mother    Depression Mother    Heart disease Mother    Varicose Veins Mother    Multiple sclerosis Mother    Cancer Father        Skin   Deep vein thrombosis Father    Asthma Sister    Hypertension Sister    Depression Sister    Learning disabilities Sister    Varicose Veins Sister    Bronchitis Sister    Cancer Maternal Grandmother    Cancer Paternal Grandfather        lung   Asthma Sister    Depression Sister    Bronchitis Sister    Deafness Daughter    Eczema Niece    Food Allergy Niece    Bronchitis Niece    Eczema Nephew    Food Allergy Nephew    Bronchitis Nephew    Urticaria Neg Hx    Atopy Neg Hx    Angioedema Neg Hx     Social History   Tobacco Use   Smoking status: Former    Types: Cigarettes    Quit date: 2010    Years since  quitting: 13.6   Smokeless tobacco: Never   Tobacco comments:    on and off socially   Vaping Use   Vaping Use: Every day  Substance Use Topics   Alcohol use: Yes    Alcohol/week: 0.0 standard drinks of alcohol    Comment: 2 seltzer beers per day   Drug use: No     Allergies  Allergen Reactions   Aspirin Other (See Comments)    "Thins bloods too much"   Compazine [Prochlorperazine Edisylate]     Dystonic.   Other     Compazine   Penicillins     Unknown reaction   Prochlorperazine     Other reaction(s): Unknown    Review of Systems NEGATIVE UNLESS OTHERWISE INDICATED IN HPI      Objective:     BP (!) 135/96 (BP Location: Left Arm)   Pulse (!) 124   Temp 99.5 F (37.5 C) (Temporal)   Ht '5\' 7"'$  (1.702 m)   Wt 202 lb 12.8 oz (92 kg)   LMP 08/11/2022   SpO2 100%   BMI 31.76 kg/m   Wt Readings from Last 3 Encounters:  08/27/22 202 lb 12.8 oz (92 kg)  03/22/22 180 lb (81.6 kg)  03/11/22 200 lb 1.9 oz (90.8 kg)    BP Readings from Last 3 Encounters:  08/27/22 (!)  135/96  08/24/22 123/82  06/06/22 139/86     Physical Exam Vitals and nursing note reviewed.  Constitutional:      Appearance: Normal appearance. She is obese. She is not toxic-appearing.  HENT:     Head: Normocephalic and atraumatic.     Right Ear: Tympanic membrane, ear canal and external ear normal.     Left Ear: Tympanic membrane, ear canal and external ear normal.     Nose: Nose normal.     Mouth/Throat:     Mouth: Mucous membranes are moist.  Eyes:     Extraocular Movements: Extraocular movements intact.     Conjunctiva/sclera: Conjunctivae normal.     Pupils: Pupils are equal, round, and reactive to light.  Cardiovascular:     Rate and Rhythm: Regular rhythm. Tachycardia present.     Pulses: Normal pulses.     Heart sounds: Normal heart sounds. No murmur heard. Pulmonary:     Effort: Pulmonary effort is normal. No respiratory distress.     Breath sounds: Normal breath sounds.  Abdominal:     General: Abdomen is flat. Bowel sounds are normal. There is no distension.     Palpations: Abdomen is soft. There is no mass.     Tenderness: There is no abdominal tenderness.     Hernia: No hernia is present.  Musculoskeletal:        General: Normal range of motion.     Cervical back: Normal range of motion and neck supple.  Skin:    General: Skin is warm and dry.  Neurological:     General: No focal deficit present.     Mental Status: She is alert and oriented to person, place, and time.  Psychiatric:        Mood and Affect: Mood normal.        Behavior: Behavior normal.        Thought Content: Thought content normal.        Judgment: Judgment normal.        Assessment & Plan:  There are no diagnoses linked to this encounter.      No follow-ups on file.  This note was prepared with assistance of Systems analyst. Occasional wrong-word or sound-a-like substitutions may have occurred due to the inherent limitations of voice recognition  software.  Time Spent: *** minutes of total time was spent on the date of the encounter performing the following actions: chart review prior to seeing the patient, obtaining history, performing a medically necessary exam, counseling on the treatment plan, placing orders, and documenting in our EHR.        M , PA-C

## 2022-08-27 NOTE — Patient Instructions (Signed)
Your COVID test was negative today. You may want to recheck in a few days.  Labs - will call with results, treat pending.

## 2022-08-29 LAB — POC COVID19 BINAXNOW: SARS Coronavirus 2 Ag: NEGATIVE

## 2022-08-30 ENCOUNTER — Telehealth: Payer: Self-pay | Admitting: Physician Assistant

## 2022-08-30 ENCOUNTER — Other Ambulatory Visit: Payer: Self-pay

## 2022-08-30 DIAGNOSIS — Z1159 Encounter for screening for other viral diseases: Secondary | ICD-10-CM

## 2022-08-30 NOTE — Telephone Encounter (Signed)
Vml for pt to call back - what other symptoms? Has she tested? .      Client [place] [facility] [place] Client Site [place] [facility] [place] Night Provider Orma Flaming- MD Contact Type Call Who Is Calling Patient / Member / Family / Caregiver Caller Name Reynalda Canny Caller Phone Number [phone] Patient Name Kendra Patton Patient DOB 23-Sep-1986 Call Type Message Only Information Provided Reason for Call Request to Schedule Office Appointment Initial Comment Caller says she was seen Friday - she thinks she has covid- she is trying to make an appt. Patient request to speak to RN No Additional Comment Provided office hours

## 2022-08-30 NOTE — Telephone Encounter (Signed)
Would you like me to call pt to schedule an appt?

## 2022-08-30 NOTE — Telephone Encounter (Signed)
LVM for pt to return my call at her convenience

## 2022-08-30 NOTE — Telephone Encounter (Signed)
Ok to place future lab order for Hep C lab?

## 2022-08-31 ENCOUNTER — Other Ambulatory Visit (INDEPENDENT_AMBULATORY_CARE_PROVIDER_SITE_OTHER): Payer: BC Managed Care – PPO

## 2022-08-31 ENCOUNTER — Other Ambulatory Visit: Payer: Self-pay

## 2022-08-31 DIAGNOSIS — K921 Melena: Secondary | ICD-10-CM

## 2022-08-31 LAB — IRON,TIBC AND FERRITIN PANEL
%SAT: 3 % (calc) — ABNORMAL LOW (ref 16–45)
Ferritin: 4 ng/mL — ABNORMAL LOW (ref 16–154)
Iron: 14 ug/dL — ABNORMAL LOW (ref 40–190)
TIBC: 555 mcg/dL (calc) — ABNORMAL HIGH (ref 250–450)

## 2022-08-31 LAB — FECAL OCCULT BLOOD, IMMUNOCHEMICAL: Fecal Occult Bld: NEGATIVE

## 2022-08-31 LAB — ANA, IFA COMPREHENSIVE PANEL
Anti Nuclear Antibody (ANA): NEGATIVE
ENA SM Ab Ser-aCnc: <1 AI
SM/RNP: <1 AI
SSA (Ro) (ENA) Antibody, IgG: <1 AI
SSB (La) (ENA) Antibody, IgG: <1 AI
Scleroderma (Scl-70) (ENA) Antibody, IgG: <1 AI
ds DNA Ab: <1 IU/mL

## 2022-08-31 LAB — RHEUMATOID FACTOR: Rheumatoid fact SerPl-aCnc: <14 IU/mL (ref <14)

## 2022-08-31 MED ORDER — VITAMIN D (ERGOCALCIFEROL) 1.25 MG (50000 UNIT) PO CAPS: 50000.0000 [IU] | ORAL_CAPSULE | ORAL | 0 refills | Status: DC

## 2022-09-10 ENCOUNTER — Encounter: Payer: Self-pay | Admitting: Physician Assistant

## 2022-09-10 ENCOUNTER — Ambulatory Visit (INDEPENDENT_AMBULATORY_CARE_PROVIDER_SITE_OTHER): Payer: BC Managed Care – PPO | Admitting: Physician Assistant

## 2022-09-10 VITALS — BP 137/88 | HR 113 | Temp 98.6°F | Ht 67.0 in | Wt 205.0 lb

## 2022-09-10 DIAGNOSIS — D509 Iron deficiency anemia, unspecified: Secondary | ICD-10-CM | POA: Insufficient documentation

## 2022-09-10 DIAGNOSIS — F988 Other specified behavioral and emotional disorders with onset usually occurring in childhood and adolescence: Secondary | ICD-10-CM

## 2022-09-10 DIAGNOSIS — K921 Melena: Secondary | ICD-10-CM

## 2022-09-10 DIAGNOSIS — R52 Pain, unspecified: Secondary | ICD-10-CM | POA: Diagnosis not present

## 2022-09-10 DIAGNOSIS — R5382 Chronic fatigue, unspecified: Secondary | ICD-10-CM

## 2022-09-10 DIAGNOSIS — D6801 Von willebrand disease, type 1: Secondary | ICD-10-CM | POA: Diagnosis not present

## 2022-09-10 NOTE — Progress Notes (Unsigned)
Subjective:    Patient ID: Kendra Patton, female    DOB: 07/13/1986, 36 y.o.   MRN: [id number]  Chief Complaint  Patient presents with   Follow-up    Pt getting BP recheck and pt did start menstrual couple days early, so no pap? Pt wants to discuss iron levels. Pt states she got mammogram done 1x year ago, they saw lumps and said they were fibroid cyst, and wanted to know if she needs another mammogram since its been a year. Also has bump on rt arm near elbow and wants to make sure it not a lymphoma.     HPI Patient is in today for f/up- she was supposed to have Pap smear updated today, but she just started her menstrual cycle.  Also wants to discuss several chronic concerns.   Past Medical History:  Diagnosis Date   Anxiety    age 83   Asthma    COVID 04/2021   Depression    Eczema    High-risk pregnancy supervision 10/27/2015   History of CMV    last pregnancy   History of OCD (obsessive compulsive disorder)    IBS (irritable bowel syndrome)    Scoliosis    Von Willebrand disease ([employer])     Past Surgical History:  Procedure Laterality Date   WISDOM TOOTH EXTRACTION      Family History  Problem Relation Age of Onset   Asthma Mother    Hypertension Mother    Cancer Mother        Type of Skin Cancer   Diabetes Mother    Depression Mother    Heart disease Mother    Varicose Veins Mother    Multiple sclerosis Mother    Cancer Father        Skin   Deep vein thrombosis Father    Asthma Sister    Hypertension Sister    Depression Sister    Learning disabilities Sister    Varicose Veins Sister    Bronchitis Sister    Cancer Maternal Grandmother    Cancer Paternal Grandfather        lung   Asthma Sister    Depression Sister    Bronchitis Sister    Deafness Daughter    Eczema Niece    Food Allergy Niece    Bronchitis Niece    Eczema Nephew    Food Allergy Nephew    Bronchitis Nephew    Urticaria Neg Hx    Atopy Neg Hx    Angioedema Neg Hx     Social  History   Tobacco Use   Smoking status: Former    Types: Cigarettes    Quit date: 2010    Years since quitting: 13.7   Smokeless tobacco: Never   Tobacco comments:    on and off socially   Vaping Use   Vaping Use: Every day  Substance Use Topics   Alcohol use: Yes    Alcohol/week: 0.0 standard drinks of alcohol    Comment: 2 seltzer beers per day   Drug use: No     Allergies  Allergen Reactions   Aspirin Other (See Comments)    "Thins bloods too much"   Compazine [Prochlorperazine Edisylate]     Dystonic.   Other     Compazine   Penicillins     Unknown reaction   Prochlorperazine     Other reaction(s): Unknown    Review of Systems NEGATIVE UNLESS OTHERWISE INDICATED IN HPI  Objective:     BP 137/88 (BP Location: Left Arm, Patient Position: Sitting)   Pulse (!) 113   Temp 98.6 F (37 C)   Ht '5\' 7"'$  (1.702 m)   Wt 205 lb (93 kg)   LMP 09/09/2022 (Exact Date)   SpO2 100%   BMI 32.11 kg/m   Wt Readings from Last 3 Encounters:  09/16/22 204 lb (92.5 kg)  09/10/22 205 lb (93 kg)  08/27/22 202 lb 12.8 oz (92 kg)    BP Readings from Last 3 Encounters:  09/16/22 (!) 144/86  09/10/22 137/88  08/27/22 (!) 135/96     Physical Exam Vitals and nursing note reviewed.  Constitutional:      Appearance: Normal appearance. She is obese. She is not toxic-appearing.  HENT:     Head: Normocephalic and atraumatic.  Eyes:     Extraocular Movements: Extraocular movements intact.     Conjunctiva/sclera: Conjunctivae normal.     Pupils: Pupils are equal, round, and reactive to light.  Cardiovascular:     Rate and Rhythm: Regular rhythm. Tachycardia present.     Pulses: Normal pulses.     Heart sounds: Normal heart sounds. No murmur heard. Pulmonary:     Effort: Pulmonary effort is normal. No respiratory distress.     Breath sounds: Normal breath sounds.  Musculoskeletal:        General: Normal range of motion.     Cervical back: Normal range of motion and  neck supple.  Skin:    General: Skin is warm and dry.  Neurological:     General: No focal deficit present.     Mental Status: She is alert and oriented to person, place, and time.  Psychiatric:        Mood and Affect: Mood normal.        Behavior: Behavior normal.        Thought Content: Thought content normal.        Judgment: Judgment normal.        Assessment & Plan:  Iron deficiency anemia, unspecified iron deficiency anemia type Assessment & Plan: Lab Results  Component Value Date   WBC 6.7 08/27/2022   HGB 11.7 (L) 08/27/2022   HCT 35.6 (L) 08/27/2022   MCV 74.3 (L) 08/27/2022   PLT 402.0 (H) 08/27/2022   Patient needs to contact GI to schedule.  Referral to hematology as well, to see if we can get her iron levels up and get her fatigue feeling better.  Orders: -     Ambulatory referral to Hematology / Oncology  Von Willebrand disease, type I [facility]) Assessment & Plan: Referral to follow-up with hematology.  Orders: -     Ambulatory referral to Hematology / Oncology  Attention deficit disorder (ADD) without hyperactivity Assessment & Plan: Takes Adderall for ADHD. Has been having to take Vyvanse this month due to stimulant shortage.   Generalized pain  Chronic fatigue Assessment & Plan: Multifactorial.  Encouraged to continue to work on lifestyle changes to the best of her ability.   Flecks of blood in stool Assessment & Plan: Could be from her history of von Willebrand disease.  She would also like to see GI.  She is going to call them to schedule.       Return 1-2 weeks, for pap smear after 4:30 pm please with Dr. Cherlynn Kaiser, [place], or [place] .  This note was prepared with assistance of Systems analyst. Occasional wrong-word or sound-a-like substitutions may have occurred due to  the inherent limitations of voice recognition software.     M , PA-C

## 2022-09-10 NOTE — Patient Instructions (Addendum)
Please call [facility] to schedule - [phone]  Call to schedule new GYN -  Rubbie Battiest, NP Gynecologist in [place], [place] Phone: [phone]  Referral to hematologist.  Iron rich diet & exercise encouraged.

## 2022-09-13 ENCOUNTER — Telehealth: Payer: Self-pay | Admitting: Oncology

## 2022-09-13 NOTE — Telephone Encounter (Signed)
Attempted to contact patient in regards to referral. No answer so voicemail was left for patient to call back  

## 2022-09-14 ENCOUNTER — Telehealth: Payer: Self-pay | Admitting: Oncology

## 2022-09-14 NOTE — Telephone Encounter (Signed)
Attempted to contact patient in regards to referral. No answer so voicemail was left for patient to call back  

## 2022-09-16 ENCOUNTER — Ambulatory Visit (INDEPENDENT_AMBULATORY_CARE_PROVIDER_SITE_OTHER): Payer: BC Managed Care – PPO | Admitting: Gastroenterology

## 2022-09-16 ENCOUNTER — Other Ambulatory Visit: Payer: BC Managed Care – PPO

## 2022-09-16 ENCOUNTER — Encounter: Payer: Self-pay | Admitting: Gastroenterology

## 2022-09-16 VITALS — BP 144/86 | HR 130 | Ht 67.0 in | Wt 204.0 lb

## 2022-09-16 DIAGNOSIS — K921 Melena: Secondary | ICD-10-CM

## 2022-09-16 DIAGNOSIS — R197 Diarrhea, unspecified: Secondary | ICD-10-CM | POA: Diagnosis not present

## 2022-09-16 DIAGNOSIS — D509 Iron deficiency anemia, unspecified: Secondary | ICD-10-CM

## 2022-09-16 DIAGNOSIS — Z1159 Encounter for screening for other viral diseases: Secondary | ICD-10-CM

## 2022-09-16 MED ORDER — NA SULFATE-K SULFATE-MG SULF 17.5-3.13-1.6 GM/177ML PO SOLN: 1.0000 | Freq: Once | ORAL | 0 refills | Status: AC

## 2022-09-16 NOTE — Assessment & Plan Note (Signed)
Could be from her history of von Willebrand disease.  She would also like to see GI.  She is going to call them to schedule.

## 2022-09-16 NOTE — Assessment & Plan Note (Signed)
Takes Adderall for ADHD. Has been having to take Vyvanse this month due to stimulant shortage.

## 2022-09-16 NOTE — Assessment & Plan Note (Signed)
Referral to follow-up with hematology.

## 2022-09-16 NOTE — Assessment & Plan Note (Signed)
Multifactorial.  Encouraged to continue to work on lifestyle changes to the best of her ability.

## 2022-09-16 NOTE — Assessment & Plan Note (Addendum)
Lab Results  Component Value Date   WBC 6.7 08/27/2022   HGB 11.7 (L) 08/27/2022   HCT 35.6 (L) 08/27/2022   MCV 74.3 (L) 08/27/2022   PLT 402.0 (H) 08/27/2022   Patient needs to contact GI to schedule.  Referral to hematology as well, to see if we can get her iron levels up and get her fatigue feeling better.

## 2022-09-16 NOTE — Patient Instructions (Signed)
Your provider has requested that you go to the basement level for lab work before leaving today. Press "B" on the elevator. The lab is located at the first door on the left as you exit the elevator.  You have been scheduled for an endoscopy and colonoscopy. Please follow the written instructions given to you at your visit today. Please pick up your prep supplies at the pharmacy within the next 1-3 days. If you use inhalers (even only as needed), please bring them with you on the day of your procedure.  Please avoid all NSAID's. Some examples of NSAID's are as follows: Aspirin (Bufferin, Bayer, and Excedrin) Ibuprofen (Advil, Motrin, Nuprin) Ketoprofen (Actron, Orudis) Naproxen (Aleve) Daypro  Indocin  Lodine  Naprosyn  Relafen  Vimovo Voltaren  The [facility] providers would like to encourage you to use [facility] to communicate with providers for non-urgent requests or questions.  Due to long hold times on the telephone, sending your provider a message by [facility] may be a faster and more efficient way to get a response.  Please allow 48 business hours for a response.  Please remember that this is for non-urgent requests.   Due to recent changes in healthcare laws, you may see the results of your imaging and laboratory studies on MyChart before your provider has had a chance to review them.  We understand that in some cases there may be results that are confusing or concerning to you. Not all laboratory results come back in the same time frame and the provider may be waiting for multiple results in order to interpret others.  Please give [place] 48 hours in order for your provider to thoroughly review all the results before contacting the office for clarification of your results.

## 2022-09-16 NOTE — Progress Notes (Signed)
Kendra Patton    [id number]    12-28-1985  Primary Care Physician:Allwardt, Randa Evens, PA-C  Referring Physician: Allwardt, Randa Evens, PA-C [place],  [place] [address]   Chief complaint:  Blood in stool, iron deficiency anemia, chronic diarrhea, right lower quadrant abdominal pain  HPI: 36 year old very pleasant female here for new patient visit for evaluation of chronic on and off diarrhea severe iron deficiency anemia and right lower quadrant abdominal pain On average she has diarrhea with 4-5 semiformed  bowel movements with excess gas and mucus 7 to 10 days followed by constipation for 2 to 3 days.  She has occasional nocturnal diarrhea and incontinence.  She occasionally notices flecks of blood mixed in stool  Constipation and diarrhea on and off for many years since she was a child  Left side abdominal pain since age 36, now its more on right side a dull ache, radiating to the back and belly button  Occasional chills and low grade fever  She has heavy menstrual bleeding, with history of Von Willebrand disease  She has severe nausea almost daily, sometimes vomits or gags even with brushing  Maternal Grandmother and uncle with colon cancer at age less than 30 No family history of IBD or celiac disease  Takes ibuprofen up to 3 times daily on some days   Outpatient Encounter Medications as of 09/16/2022  Medication Sig   albuterol (VENTOLIN HFA) 108 (90 Base) MCG/ACT inhaler Inhale 2 puffs into the lungs every 4 (four) hours as needed for wheezing or shortness of breath (coughing fits).   amphetamine-dextroamphetamine (ADDERALL XR) 30 MG 24 hr capsule Take 1 capsule (30 mg total) by mouth every morning.   EPINEPHrine (AUVI-Q) 0.3 mg/0.3 mL IJ SOAJ injection Inject 0.3 mg into the muscle as needed for anaphylaxis.   hydrOXYzine (ATARAX) 10 MG tablet Take 10 mg by mouth at bedtime.   Vitamin D, Ergocalciferol, (DRISDOL) 1.25 MG (50000 UNIT) CAPS  capsule Take once capsule by mouth once a week for 12 weeks   Vitamin D, Ergocalciferol, (DRISDOL) 1.25 MG (50000 UNIT) CAPS capsule Take 1 capsule (50,000 Units total) by mouth every 7 (seven) days.   No facility-administered encounter medications on file as of 09/16/2022.    Allergies as of 09/16/2022 - Review Complete 09/10/2022  Allergen Reaction Noted   Aspirin Other (See Comments) 02/28/2016   Compazine [prochlorperazine edisylate]  10/27/2015   Other  10/27/2015   Penicillins  02/13/2022   Prochlorperazine  08/24/2022    Past Medical History:  Diagnosis Date   Anxiety    age 17   Asthma    COVID 04/2021   Depression    Eczema    High-risk pregnancy supervision 10/27/2015   History of CMV    last pregnancy   History of OCD (obsessive compulsive disorder)    IBS (irritable bowel syndrome)    Scoliosis    Von Willebrand disease ([place])     Past Surgical History:  Procedure Laterality Date   WISDOM TOOTH EXTRACTION      Family History  Problem Relation Age of Onset   Asthma Mother    Hypertension Mother    Cancer Mother        Type of Skin Cancer   Diabetes Mother    Depression Mother    Heart disease Mother    Varicose Veins Mother    Multiple sclerosis Mother    Cancer Father  Skin   Deep vein thrombosis Father    Asthma Sister    Hypertension Sister    Depression Sister    Learning disabilities Sister    Varicose Veins Sister    Bronchitis Sister    Cancer Maternal Grandmother    Cancer Paternal Grandfather        lung   Asthma Sister    Depression Sister    Bronchitis Sister    Deafness Daughter    Eczema Niece    Food Allergy Niece    Bronchitis Niece    Eczema Nephew    Food Allergy Nephew    Bronchitis Nephew    Urticaria Neg Hx    Atopy Neg Hx    Angioedema Neg Hx     Social History   Socioeconomic History   Marital status: Married    Spouse name: Not on file   Number of children: 2   Years of education: Not on file    Highest education level: Bachelor's degree (e.g., BA, AB, BS)  Occupational History    Comment: home maker  Tobacco Use   Smoking status: Former    Types: Cigarettes    Quit date: 2010    Years since quitting: 13.7   Smokeless tobacco: Never   Tobacco comments:    on and off socially   Vaping Use   Vaping Use: Every day  Substance and Sexual Activity   Alcohol use: Yes    Alcohol/week: 0.0 standard drinks of alcohol    Comment: 2 seltzer beers per day   Drug use: No   Sexual activity: Yes    Birth control/protection: Surgical  Other Topics Concern   Not on file  Social History Narrative   Lives with family   Social Determinants of Health   Financial Resource Strain: Not on file  Food Insecurity: Not on file  Transportation Needs: Not on file  Physical Activity: Not on file  Stress: Not on file  Social Connections: Not on file  Intimate Partner Violence: Not on file      Review of systems: All other review of systems negative except as mentioned in the HPI.   Physical Exam: Vitals:   09/16/22 0854  BP: (Abnormal) 144/86  Pulse: (Abnormal) 130   Body mass index is 31.95 kg/m. Gen:      No acute distress HEENT:  sclera anicteric Lungs: Clear CV: S1-S2 regular, tachycardia Abd:      soft, non-tender; no palpable masses, no distension Ext:    No edema Neuro: alert and oriented x 3 Psych: normal mood and affect  Data Reviewed:  Reviewed labs, radiology imaging, old records and pertinent past GI work up   Assessment and Plan/Recommendations:  36 year old very pleasant female with chronic diarrhea, right lower quadrant abdominal pain, intermittent blood in stool and severe iron deficiency anemia  We will need to exclude celiac disease, Crohn's disease, inflammatory bowel disease or any neoplastic lesion.  Erosive gastroduodenitis or NSAID related ulcer also in the differential Advised patient to avoid NSAIDs Check TTG IgA antibody Schedule for EGD and  colonoscopy for further evaluation  She was referred to hematology for IV iron infusion, she is unable to tolerate oral iron, is awaiting appointment.  Return after EGD and colonoscopy to discuss further management based on findings   The patient was provided an opportunity to ask questions and all were answered. The patient agreed with the plan and demonstrated an understanding of the instructions.  Damaris Hippo , MD  CC: Allwardt, Randa Evens, PA-C

## 2022-09-17 ENCOUNTER — Other Ambulatory Visit: Payer: Self-pay | Admitting: *Deleted

## 2022-09-17 DIAGNOSIS — D509 Iron deficiency anemia, unspecified: Secondary | ICD-10-CM

## 2022-09-17 LAB — TISSUE TRANSGLUTAMINASE, IGA: (tTG) Ab, IgA: <1 U/mL

## 2022-09-17 LAB — IGA: Immunoglobulin A: 180 mg/dL (ref 47–310)

## 2022-09-17 LAB — HEPATITIS C ANTIBODY: Hepatitis C Ab: NONREACTIVE

## 2022-09-17 NOTE — Progress Notes (Signed)
Entered lab orders for 10/5

## 2022-09-20 ENCOUNTER — Encounter: Payer: Self-pay | Admitting: Physician Assistant

## 2022-09-20 NOTE — Telephone Encounter (Signed)
Please see pt msg and advise 

## 2022-09-21 ENCOUNTER — Encounter: Payer: Self-pay | Admitting: Gastroenterology

## 2022-09-21 ENCOUNTER — Ambulatory Visit (AMBULATORY_SURGERY_CENTER): Payer: BC Managed Care – PPO | Admitting: Gastroenterology

## 2022-09-21 VITALS — BP 137/89 | HR 90 | Temp 97.7°F | Resp 20 | Ht 67.0 in | Wt 204.0 lb

## 2022-09-21 DIAGNOSIS — D120 Benign neoplasm of cecum: Secondary | ICD-10-CM

## 2022-09-21 DIAGNOSIS — K649 Unspecified hemorrhoids: Secondary | ICD-10-CM | POA: Diagnosis not present

## 2022-09-21 DIAGNOSIS — K6289 Other specified diseases of anus and rectum: Secondary | ICD-10-CM

## 2022-09-21 DIAGNOSIS — K635 Polyp of colon: Secondary | ICD-10-CM | POA: Diagnosis not present

## 2022-09-21 DIAGNOSIS — K2970 Gastritis, unspecified, without bleeding: Secondary | ICD-10-CM

## 2022-09-21 DIAGNOSIS — K319 Disease of stomach and duodenum, unspecified: Secondary | ICD-10-CM | POA: Diagnosis not present

## 2022-09-21 DIAGNOSIS — K921 Melena: Secondary | ICD-10-CM

## 2022-09-21 DIAGNOSIS — D509 Iron deficiency anemia, unspecified: Secondary | ICD-10-CM

## 2022-09-21 MED ORDER — SODIUM CHLORIDE 0.9 % IV SOLN: 500.0000 mL | Freq: Once | INTRAVENOUS | Status: DC

## 2022-09-21 NOTE — Op Note (Signed)
[place] Patient Name: Kendra Patton Procedure Date: 09/21/2022 3:03 PM MRN: [id number] Endoscopist: Mauri Pole , MD Age: 36 Referring MD:  Date of Birth: 1986/10/27 Gender: Female Account #: [id number] Procedure:                Colonoscopy Indications:              Unexplained iron deficiency anemia Medicines:                Monitored Anesthesia Care Procedure:                Pre-Anesthesia Assessment:                           - Prior to the procedure, a History and Physical                            was performed, and patient medications and                            allergies were reviewed. The patient's tolerance of                            previous anesthesia was also reviewed. The risks                            and benefits of the procedure and the sedation                            options and risks were discussed with the patient.                            All questions were answered, and informed consent                            was obtained. Prior Anticoagulants: The patient has                            taken no previous anticoagulant or antiplatelet                            agents. ASA Grade Assessment: II - A patient with                            mild systemic disease. After reviewing the risks                            and benefits, the patient was deemed in                            satisfactory condition to undergo the procedure.                           After obtaining informed consent, the colonoscope  was passed under direct vision. Throughout the                            procedure, the patient's blood pressure, pulse, and                            oxygen saturations were monitored continuously. The                            PCF-HQ190L Colonoscope was introduced through the                            anus and advanced to the the cecum, identified by                            appendiceal  orifice and ileocecal valve. The                            colonoscopy was performed without difficulty. The                            patient tolerated the procedure well. The quality                            of the bowel preparation was good. The ileocecal                            valve, appendiceal orifice, and rectum were                            photographed. Scope In: 3:28:30 PM Scope Out: 3:43:47 PM Scope Withdrawal Time: 0 hours 10 minutes 4 seconds  Total Procedure Duration: 0 hours 15 minutes 17 seconds  Findings:                 The perianal and digital rectal examinations were                            normal.                           A 10 mm polyp was found in the cecum. The polyp was                            sessile. The polyp was removed with a piecemeal                            technique using a cold snare. Resection and                            retrieval were complete.                           A patchy area of mildly erythematous mucosa was  found in the rectum.                           Non-bleeding external and internal hemorrhoids were                            found during retroflexion. The hemorrhoids were                            medium-sized. Complications:            No immediate complications. Estimated Blood Loss:     Estimated blood loss was minimal. Impression:               - One 10 mm polyp in the cecum, removed piecemeal                            using a cold snare. Resected and retrieved.                           - Erythematous mucosa in the rectum.                           - Non-bleeding external and internal hemorrhoids. Recommendation:           - Patient has a contact number available for                            emergencies. The signs and symptoms of potential                            delayed complications were discussed with the                            patient. Return to normal activities  tomorrow.                            Written discharge instructions were provided to the                            patient.                           - Resume previous diet.                           - Continue present medications.                           - Await pathology results.                           - Repeat colonoscopy in 3 - 5 years for                            surveillance based on pathology results. Mauri Pole, MD 09/21/2022 4:04:36 PM This report has been signed electronically.

## 2022-09-21 NOTE — Progress Notes (Signed)
Pt's states no medical or surgical changes since previsit or office visit. 

## 2022-09-21 NOTE — Progress Notes (Signed)
Please refer to office visit note 09/16/22. No additional changes in H&P Patient is appropriate for planned procedure(s) and anesthesia in an ambulatory setting  K. Denzil Magnuson , MD [phone]

## 2022-09-21 NOTE — Progress Notes (Signed)
To pacu, VSS. Report to [email or web address] 

## 2022-09-21 NOTE — Op Note (Signed)
[place] Patient Name: Kendra Patton Procedure Date: 09/21/2022 3:04 PM MRN: [id number] Endoscopist: Mauri Pole , MD Age: 36 Referring MD:  Date of Birth: [birth year] Gender: Female Account #: [id number] Procedure:                Upper GI endoscopy Indications:              Suspected upper gastrointestinal bleeding in                            patient with unexplained iron deficiency anemia Medicines:                Monitored Anesthesia Care Procedure:                Pre-Anesthesia Assessment:                           - Prior to the procedure, a History and Physical                            was performed, and patient medications and                            allergies were reviewed. The patient's tolerance of                            previous anesthesia was also reviewed. The risks                            and benefits of the procedure and the sedation                            options and risks were discussed with the patient.                            All questions were answered, and informed consent                            was obtained. Prior Anticoagulants: The patient has                            taken no previous anticoagulant or antiplatelet                            agents. ASA Grade Assessment: II - A patient with                            mild systemic disease. After reviewing the risks                            and benefits, the patient was deemed in                            satisfactory condition to undergo the procedure.  After obtaining informed consent, the endoscope was                            passed under direct vision. Throughout the                            procedure, the patient's blood pressure, pulse, and                            oxygen saturations were monitored continuously. The                            Endoscope was introduced through the mouth, and                             advanced to the second part of duodenum. The upper                            GI endoscopy was accomplished without difficulty.                            The patient tolerated the procedure well. Scope In: Scope Out: Findings:                 The Z-line was regular and was found 36 cm from the                            incisors.                           No gross lesions were noted in the entire esophagus.                           Patchy mild inflammation characterized by                            congestion (edema) and erythema was found in the                            entire examined stomach. Biopsies were taken with a                            cold forceps for Helicobacter pylori testing.                           The cardia and gastric fundus were normal on                            retroflexion.                           The examined duodenum was normal. Complications:            No immediate complications. Estimated Blood Loss:     Estimated blood loss was minimal. Impression:               -  Z-line regular, 36 cm from the incisors.                           - No gross lesions in esophagus.                           - Gastritis. Biopsied.                           - Normal examined duodenum. Recommendation:           - Patient has a contact number available for                            emergencies. The signs and symptoms of potential                            delayed complications were discussed with the                            patient. Return to normal activities tomorrow.                            Written discharge instructions were provided to the                            patient.                           - Resume previous diet.                           - Continue present medications.                           - Await pathology results.                           -Schedule IV iron infusion next available                            appointment                            - To visualize the small bowel, perform video                            capsule endoscopy at appointment to be scheduled. Mauri Pole, MD 09/21/2022 4:24:47 PM This report has been signed electronically.

## 2022-09-21 NOTE — Patient Instructions (Addendum)
Thank you for coming in to see [place] today!  Resume your diet and medications/supplements today. Return to regular daily activities tomorrow. Biopsy results available in 1-2 weeks.  We will notify you of the results.  Recommendation will be made at that time for a future colonoscopy (most likely 3-5 years).  We will call to schedule an iron infusion and appointment to perform  video capsule endoscopy    YOU HAD AN ENDOSCOPIC PROCEDURE TODAY AT [facility]:   Refer to the procedure report that was given to you for any specific questions about what was found during the examination.  If the procedure report does not answer your questions, please call your gastroenterologist to clarify.  If you requested that your care partner not be given the details of your procedure findings, then the procedure report has been included in a sealed envelope for you to review at your convenience later.  YOU SHOULD EXPECT: Some feelings of bloating in the abdomen. Passage of more gas than usual.  Walking can help get rid of the air that was put into your GI tract during the procedure and reduce the bloating. If you had a lower endoscopy (such as a colonoscopy or flexible sigmoidoscopy) you may notice spotting of blood in your stool or on the toilet paper. If you underwent a bowel prep for your procedure, you may not have a normal bowel movement for a few days.  Please Note:  You might notice some irritation and congestion in your nose or some drainage.  This is from the oxygen used during your procedure.  There is no need for concern and it should clear up in a day or so.  SYMPTOMS TO REPORT IMMEDIATELY:  Following lower endoscopy (colonoscopy or flexible sigmoidoscopy):  Excessive amounts of blood in the stool  Significant tenderness or worsening of abdominal pains  Swelling of the abdomen that is new, acute  Fever of 100F or higher    For urgent or emergent issues, a gastroenterologist can be  reached at any hour by calling [phone]. Do not use MyChart messaging for urgent concerns.    DIET:  We do recommend a small meal at first, but then you may proceed to your regular diet.  Drink plenty of fluids but you should avoid alcoholic beverages for 24 hours.  ACTIVITY:  You should plan to take it easy for the rest of today and you should NOT DRIVE or use heavy machinery until tomorrow (because of the sedation medicines used during the test).    FOLLOW UP: Our staff will call the number listed on your records the next business day following your procedure.  We will call around 7:15- 8:00 am to check on you and address any questions or concerns that you may have regarding the information given to you following your procedure. If we do not reach you, we will leave a message.     If any biopsies were taken you will be contacted by phone or by letter within the next 1-3 weeks.  Please call us at [phone] if you have not heard about the biopsies in 3 weeks.    SIGNATURES/CONFIDENTIALITY: You and/or your care partner have signed paperwork which will be entered into your electronic medical record.  These signatures attest to the fact that that the information above on your After Visit Summary has been reviewed and is understood.  Full responsibility of the confidentiality of this discharge information lies with you and/or your care-partner.

## 2022-09-21 NOTE — Progress Notes (Signed)
Called to room to assist during endoscopic procedure.  Patient ID and intended procedure confirmed with present staff. Received instructions for my participation in the procedure from the performing physician.  

## 2022-09-22 ENCOUNTER — Encounter: Payer: Self-pay | Admitting: Physician Assistant

## 2022-09-22 ENCOUNTER — Telehealth: Payer: Self-pay | Admitting: Pharmacy Technician

## 2022-09-22 ENCOUNTER — Encounter: Payer: Self-pay | Admitting: Family

## 2022-09-22 ENCOUNTER — Other Ambulatory Visit (HOSPITAL_COMMUNITY)
Admission: RE | Admit: 2022-09-22 | Discharge: 2022-09-22 | Disposition: A | Discharge status: Home / Self Care | Payer: BC Managed Care – PPO | Source: Ambulatory Visit | Attending: Family | Admitting: Family

## 2022-09-22 ENCOUNTER — Ambulatory Visit (INDEPENDENT_AMBULATORY_CARE_PROVIDER_SITE_OTHER): Payer: BC Managed Care – PPO | Admitting: Family

## 2022-09-22 ENCOUNTER — Telehealth: Payer: Self-pay

## 2022-09-22 VITALS — BP 121/87 | HR 99 | Temp 98.0°F | Ht 67.0 in | Wt 202.8 lb

## 2022-09-22 DIAGNOSIS — R141 Gas pain: Secondary | ICD-10-CM | POA: Insufficient documentation

## 2022-09-22 DIAGNOSIS — N644 Mastodynia: Secondary | ICD-10-CM

## 2022-09-22 DIAGNOSIS — R1032 Left lower quadrant pain: Secondary | ICD-10-CM | POA: Insufficient documentation

## 2022-09-22 DIAGNOSIS — R194 Change in bowel habit: Secondary | ICD-10-CM | POA: Insufficient documentation

## 2022-09-22 DIAGNOSIS — Z124 Encounter for screening for malignant neoplasm of cervix: Secondary | ICD-10-CM | POA: Insufficient documentation

## 2022-09-22 DIAGNOSIS — Z862 Personal history of diseases of the blood and blood-forming organs and certain disorders involving the immune mechanism: Secondary | ICD-10-CM | POA: Insufficient documentation

## 2022-09-22 NOTE — Telephone Encounter (Signed)
  Follow up Call-     09/21/2022    2:24 PM  Call back number  Post procedure Call Back phone  # [phone]  Permission to leave phone message Yes     Patient questions:  Do you have a fever, pain , or abdominal swelling? Patient describes a dull achy pain, that she has been dealing with for the last year and a half. She said it is still there and wondered if the polypectomy was causing this and I told her they usually do not cause pain. She wanted me to let her MD know. I advised her to take a gas ex and she agreed. She knows to call back if it gets worse.    Have you tolerated food without any problems? Yes.    Have you been able to return to your normal activities? Yes.    Do you have any questions about your discharge instructions: Diet   No. Medications  No. Follow up visit  No.  Do you have questions or concerns about your Care? No.  Actions: * If pain score is 4 or above: No action needed, pain <4.

## 2022-09-22 NOTE — Progress Notes (Signed)
Patient ID: Kendra Patton, female    DOB: 05/02/1986, 36 y.o.   MRN: [id number]  Chief Complaint  Patient presents with   Gynecologic Exam    Pap w/ Breast exam.    HPI:      Pap smear:  pt states she had a PAP years ago and told she had positive HPV and advised to repeat PAP in 1 year, but she had insurance changes and did not get done. Breast tenderness:  pt reports having breast pain over a year ago and told she had benign fibrocystic/fibroadenomas per mammogram and advised they should go away, and she reports they have not gone away and she feels she has new ones in her left breast.      Assessment & Plan:  1. Encounter for Pap smear of cervix with HPV DNA cotesting  - Cytology - PAP  2. Breast tenderness in female unable to palpate today right breast nodule, pt can feel & reports tenderness, left breast with more palpable fibroglandular tissue, unable to palpate specific lumps/nodules but pt reports pain in both breasts.  - US BREAST LTD UNI RIGHT INC AXILLA; Future - US BREAST LTD UNI LEFT INC AXILLA; Future - MM DIAG BREAST TOMO BILATERAL; Future   Subjective:    Outpatient Medications Prior to Visit  Medication Sig Dispense Refill   albuterol (VENTOLIN HFA) 108 (90 Base) MCG/ACT inhaler Inhale 2 puffs into the lungs every 4 (four) hours as needed for wheezing or shortness of breath (coughing fits). 18 g 1   amphetamine-dextroamphetamine (ADDERALL XR) 30 MG 24 hr capsule Take 1 capsule (30 mg total) by mouth every morning. 30 capsule 0   EPINEPHrine (AUVI-Q) 0.3 mg/0.3 mL IJ SOAJ injection Inject 0.3 mg into the muscle as needed for anaphylaxis. 1 each 1   hydrOXYzine (ATARAX) 25 MG tablet Take 25-50 mg by mouth at bedtime as needed.     lisdexamfetamine (VYVANSE) 70 MG capsule Take 70 mg by mouth daily.     Vitamin D, Ergocalciferol, (DRISDOL) 1.25 MG (50000 UNIT) CAPS capsule Take 1 capsule (50,000 Units total) by mouth every 7 (seven) days. 12 capsule 0   No  facility-administered medications prior to visit.   Past Medical History:  Diagnosis Date   Anxiety    age 54   Asthma    COVID 04/2021   Depression    Eczema    High-risk pregnancy supervision 10/27/2015   History of CMV    last pregnancy   History of OCD (obsessive compulsive disorder)    IBS (irritable bowel syndrome)    Scoliosis    Von Willebrand disease ([place])    Past Surgical History:  Procedure Laterality Date   COLONOSCOPY     UPPER GASTROINTESTINAL ENDOSCOPY     WISDOM TOOTH EXTRACTION     Allergies  Allergen Reactions   Aspirin Other (See Comments)    "Thins bloods too much"   Compazine [Prochlorperazine Edisylate]     Dystonic.   Other     Compazine   Penicillins     Unknown reaction   Prochlorperazine     Other reaction(s): Unknown      Objective:    Physical Exam Vitals and nursing note reviewed. Exam conducted with a chaperone present.  Constitutional:      Appearance: Normal appearance.  Cardiovascular:     Rate and Rhythm: Normal rate and regular rhythm.  Pulmonary:     Effort: Pulmonary effort is normal.     Breath sounds:  Normal breath sounds.  Chest:  Breasts:    Breasts are asymmetrical (mild, left slightly larger).     Right: Tenderness present. No inverted nipple.     Left: Tenderness (fibroglandular tissue noted in medial and lateral breast) present. No inverted nipple.  Genitourinary:    Exam position: Lithotomy position.     Pubic Area: No rash or pubic lice.      Labia:        Right: No rash or tenderness.        Left: No rash or tenderness.      Comments: Pap smear specimen obtained, pt with moderate amt of leukorrhea.  Musculoskeletal:        General: Normal range of motion.  Skin:    General: Skin is warm and dry.  Neurological:     Mental Status: She is alert.  Psychiatric:        Mood and Affect: Mood normal.        Behavior: Behavior normal.    BP 121/87 (BP Location: Left Arm, Patient Position: Sitting, Cuff Size:  Large)   Pulse 99   Temp 98 F (36.7 C) (Temporal)   Ht '5\' 7"'$  (1.702 m)   Wt 202 lb 12.8 oz (92 kg)   LMP 09/09/2022 (Exact Date)   SpO2 98%   BMI 31.76 kg/m  Wt Readings from Last 3 Encounters:  09/22/22 202 lb 12.8 oz (92 kg)  09/21/22 204 lb (92.5 kg)  09/16/22 204 lb (92.5 kg)       Jeanie Sewer, NP

## 2022-09-22 NOTE — Telephone Encounter (Addendum)
Dr. Silverio Decamp, [facility] note:  Auth Submission: NO AUTH NEEDED Payer: [employer]UE [place] MEDICAID Medication & CPT/J Code(s) submitted: Feraheme (ferumoxytol) [id number] Route of submission (phone, fax, portal):  Phone # Fax # Auth type: Buy/Bill Units/visits requested: X2 Reference number: [id number] - MEDICAID REF: [place] [id number] - [place] Approval from: 09/22/22 to 12/19/22   Patient will be scheduled as soon as possible

## 2022-09-23 ENCOUNTER — Inpatient Hospital Stay: Payer: BC Managed Care – PPO

## 2022-09-23 ENCOUNTER — Inpatient Hospital Stay: Payer: BC Managed Care – PPO | Admitting: Nurse Practitioner

## 2022-09-28 ENCOUNTER — Encounter: Payer: Self-pay | Admitting: Physician Assistant

## 2022-09-28 LAB — CYTOLOGY - PAP
Comment: NEGATIVE
Diagnosis: UNDETERMINED — AB
High risk HPV: NEGATIVE

## 2022-09-29 ENCOUNTER — Other Ambulatory Visit: Payer: Self-pay | Admitting: Pharmacy Technician

## 2022-09-29 ENCOUNTER — Telehealth: Payer: Self-pay | Admitting: Pharmacy Technician

## 2022-09-29 ENCOUNTER — Other Ambulatory Visit: Payer: Self-pay

## 2022-09-29 ENCOUNTER — Ambulatory Visit (INDEPENDENT_AMBULATORY_CARE_PROVIDER_SITE_OTHER): Payer: BC Managed Care – PPO

## 2022-09-29 VITALS — BP 129/84 | HR 88 | Temp 98.9°F | Resp 18 | Ht 67.0 in | Wt 206.0 lb

## 2022-09-29 DIAGNOSIS — K921 Melena: Secondary | ICD-10-CM | POA: Diagnosis not present

## 2022-09-29 DIAGNOSIS — R5382 Chronic fatigue, unspecified: Secondary | ICD-10-CM | POA: Diagnosis not present

## 2022-09-29 DIAGNOSIS — D500 Iron deficiency anemia secondary to blood loss (chronic): Secondary | ICD-10-CM

## 2022-09-29 MED ORDER — SODIUM CHLORIDE 0.9 % IV SOLN
510.0000 mg | Freq: Once | INTRAVENOUS | Status: AC
  Administered 2022-09-29: 510 mg via INTRAVENOUS
  Filled 2022-09-29: qty 17

## 2022-09-29 NOTE — Progress Notes (Signed)
Diagnosis: Iron Deficiency Anemia  Provider:  Marshell Garfinkel MD  Procedure: Infusion  IV Type: Peripheral, IV Location: L Antecubital  Feraheme (Ferumoxytol), Dose: 510 mg  Infusion Start Time: 4103  Infusion Stop Time: 1600  Post Infusion IV Care: Observation period completed and Peripheral IV Discontinued  Discharge: Condition: Good, Destination: Home . AVS provided to patient.   Performed by:  Arnoldo Morale, RN

## 2022-09-29 NOTE — Progress Notes (Signed)
Your PAP is positive for Atypical cells, but with the negative HPV, (the virus that causes cervical cancer) this is not concerning and it is recommended to retest in 3 years.  Let me know if you have any questions!

## 2022-09-29 NOTE — Telephone Encounter (Signed)
error 

## 2022-09-30 ENCOUNTER — Encounter: Payer: Self-pay | Admitting: Gastroenterology

## 2022-09-30 ENCOUNTER — Telehealth: Payer: Self-pay

## 2022-09-30 NOTE — Telephone Encounter (Signed)
Called the patient to arrange the video capsule endoscopy. No answer. Left a message to call back when ready to schedule this.

## 2022-10-06 ENCOUNTER — Other Ambulatory Visit: Payer: Self-pay | Admitting: Nurse Practitioner

## 2022-10-06 ENCOUNTER — Ambulatory Visit (INDEPENDENT_AMBULATORY_CARE_PROVIDER_SITE_OTHER): Payer: BC Managed Care – PPO

## 2022-10-06 VITALS — BP 124/79 | HR 91 | Temp 98.3°F | Resp 16 | Ht 67.0 in | Wt 205.0 lb

## 2022-10-06 DIAGNOSIS — R5382 Chronic fatigue, unspecified: Secondary | ICD-10-CM | POA: Diagnosis not present

## 2022-10-06 DIAGNOSIS — D509 Iron deficiency anemia, unspecified: Secondary | ICD-10-CM

## 2022-10-06 DIAGNOSIS — D500 Iron deficiency anemia secondary to blood loss (chronic): Secondary | ICD-10-CM

## 2022-10-06 DIAGNOSIS — K921 Melena: Secondary | ICD-10-CM

## 2022-10-06 MED ORDER — SODIUM CHLORIDE 0.9 % IV SOLN
510.0000 mg | Freq: Once | INTRAVENOUS | Status: AC
  Administered 2022-10-06: 510 mg via INTRAVENOUS
  Filled 2022-10-06: qty 17

## 2022-10-06 NOTE — Progress Notes (Signed)
Diagnosis: Iron Deficiency Anemia  Provider:  Marshell Garfinkel MD  Procedure: Infusion  IV Type: Peripheral, IV Location: R Hand  Feraheme (Ferumoxytol), Dose: 510 mg  Infusion Start Time: 5597  Infusion Stop Time: 4163  Post Infusion IV Care: Peripheral IV Discontinued  Discharge: Condition: Good, Destination: Home . AVS provided to patient.   Performed by:  Adelina Mings, LPN

## 2022-10-06 NOTE — Progress Notes (Signed)
New Hematology/Oncology Consult   Requesting MD: Kendra Patton, [place]      Reason for Consult: Iron deficiency anemia, von Willebrand disease type I  HPI: Kendra Patton is a 36 year old woman referred for iron deficiency anemia.  She was seen by Kendra Duty, PA on 08/27/2022 for evaluation of fatigue.  CBC returned with a hemoglobin of 11.7, MCV 74, platelet count 402,000; iron 14, TIBC 555, percent saturation 3, ferritin 4.  She underwent a colonoscopy and upper endoscopy 09/21/2022-colonoscopy with one 10 mm polyp in the cecum, erythematous mucosa in the rectum, nonbleeding external and internal hemorrhoids (sessile serrated polyp without cytologic dysplasia cecum); upper endoscopy with no gross lesions in esophagus, patchy mild inflammation in the entire examined stomach (negative H. Pylori).  She was referred for IV iron, received Feraheme 510 mg on 09/29/2022 and 10/06/2022.  She has a history of type I von Willebrand's, previously saw Dr. Beryle Beams.  She reports using DDAVP nasal spray many years ago with her menstrual cycle and prior to wisdom tooth extraction.  She feels the DDAVP did help her menstrual cycle.  She reports tolerating the IV iron without signs of allergic reaction.  She intermittently sees "dark specks" in her stool, not sure if this is blood.  She reports no explanation for the iron deficiency found on recent upper endoscopy, colonoscopy.  No hematuria.  She has a heavy menstrual cycle.  She bleeds for 5 to 6 days, flow is heavy for 3 to 4 days with clots.  She is unable to tolerate oral iron, causes "unbearable" nausea.    Past Medical History:  Diagnosis Date   Anxiety    age 47   Asthma    COVID 04/2021   Depression    Eczema    High-risk pregnancy supervision 10/27/2015   History of CMV    last pregnancy   History of OCD (obsessive compulsive disorder)    IBS (irritable bowel syndrome)    Scoliosis    Von Willebrand disease ([place])       Past Surgical History:  Procedure Laterality Date   COLONOSCOPY     UPPER GASTROINTESTINAL ENDOSCOPY     WISDOM TOOTH EXTRACTION       Current Outpatient Medications:    albuterol (VENTOLIN HFA) 108 (90 Base) MCG/ACT inhaler, Inhale 2 puffs into the lungs every 4 (four) hours as needed for wheezing or shortness of breath (coughing fits)., Disp: 18 g, Rfl: 1   amphetamine-dextroamphetamine (ADDERALL XR) 30 MG 24 hr capsule, Take 1 capsule (30 mg total) by mouth every morning., Disp: 30 capsule, Rfl: 0   EPINEPHrine (AUVI-Q) 0.3 mg/0.3 mL IJ SOAJ injection, Inject 0.3 mg into the muscle as needed for anaphylaxis., Disp: 1 each, Rfl: 1   hydrOXYzine (ATARAX) 25 MG tablet, Take 25-50 mg by mouth at bedtime as needed., Disp: , Rfl:    lisdexamfetamine (VYVANSE) 70 MG capsule, Take 70 mg by mouth daily., Disp: , Rfl:    Vitamin D, Ergocalciferol, (DRISDOL) 1.25 MG (50000 UNIT) CAPS capsule, Take 1 capsule (50,000 Units total) by mouth every 7 (seven) days., Disp: 12 capsule, Rfl: 0:     Allergies  Allergen Reactions   Aspirin Other (See Comments)    "Thins bloods too much"   Compazine [Prochlorperazine Edisylate]     Dystonic.   Other     Compazine   Penicillins     Unknown reaction   Prochlorperazine     Other reaction(s): Unknown    FH: Mother has  MS, father had recent heart surgery; paternal grandfather had lung cancer, maternal great grandmother colon cancer, maternal great uncle colon cancer, paternal first cousin deceased age 59 with ovarian and uterine cancer, told she had a "genetic mutation.  SOCIAL HISTORY: She lives in [place].  She has 2 children.  She works at [employer].  She vapes.  EtOH use reported at 2 beers or a glass of wine per night.  Review of Systems: She eats a regular diet.  For the past 4 months she has been craving ice.  She is tired and in general does not feel well.  No tongue soreness.  No bleeding except for the menstrual cycle as noted in the  HPI and a few recent nosebleeds.  She is unable to tolerate oral iron due to nausea.  She has chronic morning nausea.  Bowel habits alternate constipation and diarrhea for the past 6 months to 1 year.  She reports a history of IBS.  No hematuria.  She is being evaluated for "clicking" in her left ear.  About 2 weeks ago she developed pain at the left temple and neck.  Physical Exam:  Blood pressure 134/84, pulse 97, temperature 98.1 F (36.7 C), temperature source Oral, resp. rate 18, height '5\' 7"'$  (1.702 m), weight 205 lb 9.6 oz (93.3 kg), last menstrual period 09/09/2022, SpO2 100 %.  HEENT: No thrush or ulcers. Lungs: Lungs clear bilaterally. Cardiac: Regular rate and rhythm. Abdomen: No hepatosplenomegaly. Vascular: No leg edema. Lymph nodes: No palpable cervical, supraclavicular, axillary or inguinal lymph nodes. Neurologic: Alert and oriented. Skin: No rash.  Resolving ecchymosis left forearm.  LABS:   Recent Labs    10/07/22 1341  WBC 6.5  HGB 11.2*  HCT 35.8*  PLT 374    No results for input(s): "NA", "K", "CL", "CO2", "GLUCOSE", "BUN", "CREATININE", "CALCIUM" in the last 72 hours.    RADIOLOGY:  No results found.  Assessment and Plan:   Iron deficiency anemia 08/27/2022 ferritin 4 Feraheme 510 mg 09/29/2022 and 10/06/2022 Menorrhagia  Von Willebrand's type I Allergic asthma ADHD Insomnia  Ms. Pae appears to have iron deficiency anemia, likely due to menorrhagia.  She recently completed a course of IV iron.  Ferritin today is in normal range.  Hemoglobin will hopefully correct over the next few months.  She utilized DDAVP nasal spray for management of menorrhagia many years ago.  She is not sure she wants to resume DDAVP.  We made a referral to Kendra Patton, gynecology, to assist with management of menorrhagia.  Patient also reports a recent abnormal Pap smear.  Family history is significant for several family members with malignancy, possible  genetic mutation per patient report.  We made a referral to the genetics counselor.  She will return for lab and follow-up in approximately 2 months.  Patient seen with Dr. Benay Spice.   Kendra Card, NP 10/07/2022, 3:16 PM   This was a shared visit with Kendra Patton.  KendraSackmann is referred for evaluation of iron deficiency anemia.  The iron deficiency appears to be related to menorrhagia.  She has undergone an evaluation by gastroenterology for a source of blood loss.  She has received IV iron. We recommend she see gynecology for menorrhagia  I was present for greater than 50% of today's visit.  I performed medical making.  Kendra Manson, MD

## 2022-10-07 ENCOUNTER — Inpatient Hospital Stay (HOSPITAL_BASED_OUTPATIENT_CLINIC_OR_DEPARTMENT_OTHER): Payer: BC Managed Care – PPO | Admitting: Nurse Practitioner

## 2022-10-07 ENCOUNTER — Encounter: Payer: Self-pay | Admitting: Nurse Practitioner

## 2022-10-07 ENCOUNTER — Inpatient Hospital Stay: Payer: BC Managed Care – PPO | Attending: Oncology

## 2022-10-07 VITALS — BP 134/84 | HR 97 | Temp 98.1°F | Resp 18 | Ht 67.0 in | Wt 205.6 lb

## 2022-10-07 DIAGNOSIS — N920 Excessive and frequent menstruation with regular cycle: Secondary | ICD-10-CM | POA: Insufficient documentation

## 2022-10-07 DIAGNOSIS — G4700 Insomnia, unspecified: Secondary | ICD-10-CM | POA: Diagnosis not present

## 2022-10-07 DIAGNOSIS — Z8041 Family history of malignant neoplasm of ovary: Secondary | ICD-10-CM | POA: Diagnosis not present

## 2022-10-07 DIAGNOSIS — D500 Iron deficiency anemia secondary to blood loss (chronic): Secondary | ICD-10-CM | POA: Insufficient documentation

## 2022-10-07 DIAGNOSIS — Z801 Family history of malignant neoplasm of trachea, bronchus and lung: Secondary | ICD-10-CM | POA: Insufficient documentation

## 2022-10-07 DIAGNOSIS — J45909 Unspecified asthma, uncomplicated: Secondary | ICD-10-CM | POA: Diagnosis not present

## 2022-10-07 DIAGNOSIS — Z800 Family history of malignant neoplasm of digestive organs: Secondary | ICD-10-CM | POA: Diagnosis not present

## 2022-10-07 DIAGNOSIS — F909 Attention-deficit hyperactivity disorder, unspecified type: Secondary | ICD-10-CM | POA: Insufficient documentation

## 2022-10-07 DIAGNOSIS — D6800 Von Willebrand disease, unspecified: Secondary | ICD-10-CM | POA: Insufficient documentation

## 2022-10-07 DIAGNOSIS — D509 Iron deficiency anemia, unspecified: Secondary | ICD-10-CM

## 2022-10-07 LAB — CBC WITH DIFFERENTIAL (CANCER CENTER ONLY)
Abs Immature Granulocytes: 0.03 10*3/uL (ref 0.00–0.07)
Basophils Absolute: 0.1 10*3/uL (ref 0.0–0.1)
Basophils Relative: 1 %
Eosinophils Absolute: 0.2 10*3/uL (ref 0.0–0.5)
Eosinophils Relative: 4 %
HCT: 35.8 % — ABNORMAL LOW (ref 36.0–46.0)
Hemoglobin: 11.2 g/dL — ABNORMAL LOW (ref 12.0–15.0)
Immature Granulocytes: 1 %
Lymphocytes Relative: 42 %
Lymphs Abs: 2.8 10*3/uL (ref 0.7–4.0)
MCH: 23.9 pg — ABNORMAL LOW (ref 26.0–34.0)
MCHC: 31.3 g/dL (ref 30.0–36.0)
MCV: 76.5 fL — ABNORMAL LOW (ref 80.0–100.0)
Monocytes Absolute: 0.3 10*3/uL (ref 0.1–1.0)
Monocytes Relative: 5 %
Neutro Abs: 3.1 10*3/uL (ref 1.7–7.7)
Neutrophils Relative %: 47 %
Platelet Count: 374 10*3/uL (ref 150–400)
RBC: 4.68 MIL/uL (ref 3.87–5.11)
RDW: 17.1 % — ABNORMAL HIGH (ref 11.5–15.5)
WBC Count: 6.5 10*3/uL (ref 4.0–10.5)
nRBC: 0 % (ref 0.0–0.2)

## 2022-10-07 LAB — SAMPLE TO BLOOD BANK

## 2022-10-07 LAB — FERRITIN: Ferritin: 210 ng/mL (ref 11–307)

## 2022-10-08 ENCOUNTER — Telehealth: Payer: Self-pay | Admitting: Genetic Counselor

## 2022-10-08 NOTE — Telephone Encounter (Signed)
Scheduled appt per 10/19 referral. Pt is aware of appt date and time. Pt is aware to arrive 15 mins prior to appt time and to bring and updated insurance card. Pt is aware of appt location.   

## 2022-10-10 ENCOUNTER — Encounter: Payer: Self-pay | Admitting: Gastroenterology

## 2022-10-11 ENCOUNTER — Other Ambulatory Visit: Payer: Self-pay

## 2022-10-11 ENCOUNTER — Other Ambulatory Visit: Payer: Self-pay | Admitting: Nurse Practitioner

## 2022-10-11 DIAGNOSIS — D509 Iron deficiency anemia, unspecified: Secondary | ICD-10-CM

## 2022-10-11 DIAGNOSIS — K922 Gastrointestinal hemorrhage, unspecified: Secondary | ICD-10-CM

## 2022-10-11 NOTE — Telephone Encounter (Signed)
Patients fiance calling to follow up an schedule as soon as possible.

## 2022-10-11 NOTE — Telephone Encounter (Signed)
Spoke with the patient. Explained briefly about capsule endoscopy procedure and risks. States she had been reading and watching videos on the procedure as well. No questions.  Appointment scheduled for 10/26/22 to have capsule endoscopy.

## 2022-10-11 NOTE — Telephone Encounter (Addendum)
Patients fiance called to schedule capsule said he will have another break about 1:30pm.

## 2022-10-12 ENCOUNTER — Telehealth: Payer: Self-pay

## 2022-10-12 ENCOUNTER — Encounter: Payer: Self-pay | Admitting: *Deleted

## 2022-10-12 NOTE — Telephone Encounter (Signed)
-----   Message from Owens Shark, NP sent at 10/12/2022  8:02 AM EDT ----- Can peripheral blood smear be added to lab from last week?

## 2022-10-12 NOTE — Progress Notes (Signed)
Faxed referral order, demographics, office note,labs and med list to Dr. Edwinna Areola at [phone]

## 2022-10-12 NOTE — Telephone Encounter (Signed)
Per [place] B. Percell Miller she can not add the PBS to lab from last week. Lavender tube only last for 24 hour.

## 2022-10-26 ENCOUNTER — Ambulatory Visit (INDEPENDENT_AMBULATORY_CARE_PROVIDER_SITE_OTHER): Payer: BC Managed Care – PPO | Admitting: Gastroenterology

## 2022-10-26 DIAGNOSIS — D500 Iron deficiency anemia secondary to blood loss (chronic): Secondary | ICD-10-CM

## 2022-10-26 NOTE — Progress Notes (Signed)
SN: 5L7 ATG N Exp: 2023/12/25 LOT: [id number] Patient arrived for Capsule Endoscopy. Reported the prep went well. This nurse explained dietary restrictions for the next few hours. Patient verbalized understanding. Opened capsule, ensured capsule was flashing prior to the patient swallowing the capsule. Patient swallowed capsule without difficulty. Patient instructed to return to the office at 4:00 pm today for removal of the recording equipment, to call the office with any questions and if no capsule was visualized after 72 hours. No further questions by the conclusion of the visit.

## 2022-10-26 NOTE — Patient Instructions (Signed)

## 2022-10-27 ENCOUNTER — Telehealth: Payer: Self-pay | Admitting: Genetic Counselor

## 2022-10-27 NOTE — Telephone Encounter (Signed)
Contacted patient to scheduled appointments. Left message with appointment details and a call back number if patient had any questions or could not accommodate the time we provided.   

## 2022-11-08 ENCOUNTER — Other Ambulatory Visit: Payer: Medicaid Other

## 2022-11-22 ENCOUNTER — Telehealth: Payer: Self-pay

## 2022-11-22 NOTE — Telephone Encounter (Signed)
Called to discuss with the patient. No answer. Left message of my call.   Complete capsule study. Gastritis as noted on EGD. 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 Otherwise negative.   Iron deficiency anemia likely secondary to menorrhagia in a patient with Von Willebrand disease. Small AVM's may contribute to a degree of chronic blood loss. Consider ablation with APC if develops evidence of GI bleeding  Management with IV iron per hematology.

## 2022-11-22 NOTE — Telephone Encounter (Signed)
Patient returned your call, requesting a call back.  

## 2022-11-23 NOTE — Telephone Encounter (Signed)
Patient advised.

## 2022-11-30 ENCOUNTER — Other Ambulatory Visit: Payer: Medicaid Other

## 2022-11-30 ENCOUNTER — Encounter: Payer: Medicaid Other | Admitting: Genetic Counselor

## 2022-12-08 ENCOUNTER — Inpatient Hospital Stay: Payer: BC Managed Care – PPO

## 2022-12-08 ENCOUNTER — Inpatient Hospital Stay: Payer: BC Managed Care – PPO | Admitting: Nurse Practitioner

## 2022-12-15 ENCOUNTER — Encounter: Payer: Medicaid Other | Admitting: Genetic Counselor

## 2022-12-15 ENCOUNTER — Other Ambulatory Visit: Payer: Medicaid Other

## 2023-03-02 ENCOUNTER — Encounter (HOSPITAL_BASED_OUTPATIENT_CLINIC_OR_DEPARTMENT_OTHER): Payer: Self-pay

## 2023-03-02 ENCOUNTER — Encounter: Payer: Self-pay | Admitting: Gastroenterology

## 2023-03-02 ENCOUNTER — Other Ambulatory Visit: Payer: Self-pay

## 2023-03-02 DIAGNOSIS — Z5321 Procedure and treatment not carried out due to patient leaving prior to being seen by health care provider: Secondary | ICD-10-CM | POA: Insufficient documentation

## 2023-03-02 DIAGNOSIS — Z733 Stress, not elsewhere classified: Secondary | ICD-10-CM | POA: Insufficient documentation

## 2023-03-02 DIAGNOSIS — G4700 Insomnia, unspecified: Secondary | ICD-10-CM | POA: Diagnosis present

## 2023-03-02 NOTE — ED Notes (Signed)
This RN was called to registration desk because patient and her husband were arguing at the desk.  Patient reports that she took a sleeping pill with a glass of wine because she has been up for the past 3 days.  Patient reports history of insomnia.  Patient reports that she doesn't need to be here and husband brought her up here.  Patient encouraged to stay for duration of evaluation. Patient agreed to stay.

## 2023-03-02 NOTE — ED Triage Notes (Signed)
Pt to ED by POV from home c/o insomnia on and off for the past 4 weeks. Pt endorses drinking half a bottle of wine this evening and took 7x insomnia medicine (Diphenhydramine). Pt endorses increased stress in her personal life. Denies any SI/HI. VSS.

## 2023-03-03 ENCOUNTER — Emergency Department (HOSPITAL_BASED_OUTPATIENT_CLINIC_OR_DEPARTMENT_OTHER)
Admission: EM | Admit: 2023-03-03 | Discharge: 2023-03-03 | Discharge status: Against Medical Advice | Payer: Managed Care, Other (non HMO) | Attending: Emergency Medicine | Admitting: Emergency Medicine

## 2023-03-03 NOTE — ED Notes (Addendum)
Pt called x3 to be roomed, no answer x3.

## 2023-03-06 ENCOUNTER — Encounter: Payer: Self-pay | Admitting: Emergency Medicine

## 2023-03-06 ENCOUNTER — Ambulatory Visit: Payer: Managed Care, Other (non HMO)

## 2023-03-06 ENCOUNTER — Encounter: Payer: Self-pay | Admitting: Gastroenterology

## 2023-03-06 ENCOUNTER — Ambulatory Visit
Admission: EM | Admit: 2023-03-06 | Discharge: 2023-03-06 | Disposition: A | Discharge status: Home / Self Care | Payer: Medicaid Other | Attending: Nurse Practitioner | Admitting: Nurse Practitioner

## 2023-03-06 DIAGNOSIS — M549 Dorsalgia, unspecified: Secondary | ICD-10-CM

## 2023-03-06 DIAGNOSIS — M546 Pain in thoracic spine: Secondary | ICD-10-CM | POA: Diagnosis not present

## 2023-03-06 LAB — POCT URINALYSIS DIP (MANUAL ENTRY)
Bilirubin, UA: NEGATIVE
Glucose, UA: NEGATIVE mg/dL
Ketones, POC UA: NEGATIVE mg/dL
Leukocytes, UA: NEGATIVE
Nitrite, UA: NEGATIVE
Protein Ur, POC: NEGATIVE mg/dL
Spec Grav, UA: <=1.005 — AB (ref 1.010–1.025)
Urobilinogen, UA: 0.2 E.U./dL
pH, UA: 6 (ref 5.0–8.0)

## 2023-03-06 MED ORDER — KETOROLAC TROMETHAMINE 30 MG/ML IJ SOLN
30.0000 mg | Freq: Once | INTRAMUSCULAR | Status: AC
  Administered 2023-03-06: 30 mg via INTRAMUSCULAR

## 2023-03-06 NOTE — Discharge Instructions (Addendum)
Your urinalysis does not indicate a clear urinary tract infection.  A urine culture has been ordered.  You will be contacted if the pending test results are positive. Make sure you are drinking at least 8-10 eight ounce glasses of water daily. You have been given an injection of Toradol 30 mg today for pain.  Do not take any additional ibuprofen, Aleve, or other NSAIDs today. May take over-the-counter Tylenol arthritis strength 650 mg tablets as needed to help with pain or discomfort. May apply ice or heat as needed.  Apply ice for pain or swelling, heat for spasm or stiffness.  Apply for 20 minutes, remove for 1 hour, then repeat is much as possible. As discussed, if symptoms do not improve, recommend that you follow-up in the emergency department for further evaluation. Follow-up as needed.

## 2023-03-06 NOTE — ED Triage Notes (Signed)
Lower back pain x 9 days.  Denies pain on urination.  No known injury

## 2023-03-06 NOTE — ED Provider Notes (Signed)
[facility]    CSN: [id number] Arrival date & time: 03/06/23  1036      History   Chief Complaint No chief complaint on file.   HPI Kendra Patton is a 37 y.o. female.   The history is provided by the patient.   The patient presents for complaints of right-sided mid back pain.  Patient states symptoms have been present for the past 9 days.  She states over the past several days, pain has worsened.  She states that her pain is 7/10 at present.  She describes the pain as "sharp".  Pain worsens when she moves such as twisting or turning.  Patient states that the pain has not been relieved with over-the-counter analgesics.  Patient also states that she feels like the area in the right side of her back is swollen, and she is also noticed a bruise to the area.  She denies any known injury or trauma.  Patient states that she does have a history of vesicoureteral reflux as a child, and has had a long history of kidney infections.  States since her symptoms started, she has noticed increased urinary frequency.  She denies dysuria, hematuria, or low back pain.  Past Medical History:  Diagnosis Date   Anxiety    age 42   Asthma    COVID 04/2021   Depression    Eczema    High-risk pregnancy supervision 10/27/2015   History of CMV    last pregnancy   History of OCD (obsessive compulsive disorder)    IBS (irritable bowel syndrome)    Scoliosis    Von Willebrand disease ([place])     Patient Active Problem List   Diagnosis Date Noted   Change in bowel habit 09/22/2022   Flatulence, eructation and gas pain 09/22/2022   History of blood disorder 09/22/2022   Left lower quadrant pain 09/22/2022   Generalized pain 09/10/2022   Chronic fatigue 09/10/2022   Flecks of blood in stool 09/10/2022   Iron deficiency anemia 09/10/2022   Not well controlled moderate persistent asthma 03/11/2022   Other adverse food reactions, not elsewhere classified, subsequent encounter 05/26/2021    Oral allergy syndrome, subsequent encounter 05/26/2021   Dyshidrotic eczema 05/26/2021   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   Polycystic ovary syndrome 12/24/2019   Vaginal delivery 02/29/2016   Anxiety - dx'd at age 68 02/28/2016   Eczema 02/28/2016   IBS (irritable bowel syndrome) 02/28/2016   Scoliosis 02/28/2016   Seasonal and perennial allergic rhinoconjunctivitis 02/28/2016   Allergy history, drug -- Compazine (dystonic rxn; seizures) 02/27/2016   High-risk pregnancy supervision 10/27/2015   Von Willebrand disease, type I ([place]) 07/11/2015   Aspirin allergy 07/11/2015    Past Surgical History:  Procedure Laterality Date   COLONOSCOPY     UPPER GASTROINTESTINAL ENDOSCOPY     WISDOM TOOTH EXTRACTION      OB History     Gravida  2   Para  2   Term  2   Preterm      AB      Living  2      SAB      IAB      Ectopic      Multiple  0   Live Births  2            Home Medications    Prior to Admission medications   Medication Sig Start Date End Date Taking? Authorizing Provider  albuterol (VENTOLIN HFA)  108 (90 Base) MCG/ACT inhaler Inhale 2 puffs into the lungs every 4 (four) hours as needed for wheezing or shortness of breath (coughing fits). 03/11/22   Garnet Sierras, DO  amphetamine-dextroamphetamine (ADDERALL XR) 30 MG 24 hr capsule Take 1 capsule (30 mg total) by mouth every morning. 06/01/21   Kennyth Arnold, FNP  EPINEPHrine (AUVI-Q) 0.3 mg/0.3 mL IJ SOAJ injection Inject 0.3 mg into the muscle as needed for anaphylaxis. 05/26/21   Garnet Sierras, DO  hydrOXYzine (ATARAX) 25 MG tablet Take 25-50 mg by mouth at bedtime as needed. 08/20/22   [provider]  lisdexamfetamine (VYVANSE) 70 MG capsule Take 70 mg by mouth daily.    [provider]  Vitamin D, Ergocalciferol, (DRISDOL) 1.25 MG (50000 UNIT) CAPS capsule Take 1 capsule (50,000 Units total) by mouth every 7 (seven) days. 08/31/22   Allwardt, Randa Evens, PA-C     Family History Family History  Problem Relation Age of Onset   Asthma Mother    Hypertension Mother    Cancer Mother        Type of Skin Cancer   Diabetes Mother    Depression Mother    Heart disease Mother    Varicose Veins Mother    Multiple sclerosis Mother    Cancer Father        Skin   Deep vein thrombosis Father    Asthma Sister    Hypertension Sister    Depression Sister    Learning disabilities Sister    Varicose Veins Sister    Bronchitis Sister    Asthma Sister    Depression Sister    Bronchitis Sister    Colon cancer Maternal Uncle    Cancer Maternal Grandmother    Cancer Paternal Grandfather        lung   Deafness Daughter    Eczema Nephew    Food Allergy Nephew    Bronchitis Nephew    Eczema Niece    Food Allergy Niece    Bronchitis Niece    Colon cancer Other    Urticaria Neg Hx    Atopy Neg Hx    Angioedema Neg Hx    Esophageal cancer Neg Hx    Stomach cancer Neg Hx    Rectal cancer Neg Hx     Social History Social History   Tobacco Use   Smoking status: Former    Types: Cigarettes    Quit date: 2010    Years since quitting: 14.2   Smokeless tobacco: Never   Tobacco comments:    on and off socially   Vaping Use   Vaping Use: Every day   Substances: Nicotine  Substance Use Topics   Alcohol use: Yes    Alcohol/week: 0.0 standard drinks of alcohol    Comment: 2 seltzer beers per day   Drug use: No     Allergies   Aspirin, Compazine [prochlorperazine edisylate], Other, Penicillins, and Prochlorperazine   Review of Systems Review of Systems Per HPI  Physical Exam Triage Vital Signs ED Triage Vitals  Enc Vitals Group     BP 03/06/23 1132 (!) 137/92     Pulse Rate 03/06/23 1132 (!) 118     Resp 03/06/23 1132 18     Temp 03/06/23 1132 98.2 F (36.8 C)     Temp Source 03/06/23 1132 Oral     SpO2 03/06/23 1132 99 %     Weight --      Height --  Head Circumference --      Peak Flow --      Pain Score 03/06/23 1133  7     Pain Loc --      Pain Edu? --      Excl. in Person? --    No data found.  Updated Vital Signs BP (!) 137/92 (BP Location: Right Arm)   Pulse (!) 118   Temp 98.2 F (36.8 C) (Oral)   Resp 18   LMP 02/26/2023 (Exact Date)   SpO2 99%   Visual Acuity Right Eye Distance:   Left Eye Distance:   Bilateral Distance:    Right Eye Near:   Left Eye Near:    Bilateral Near:     Physical Exam Vitals and nursing note reviewed.  Constitutional:      General: She is not in acute distress.    Appearance: Normal appearance.  HENT:     Head: Normocephalic.  Eyes:     Extraocular Movements: Extraocular movements intact.     Pupils: Pupils are equal, round, and reactive to light.  Cardiovascular:     Rate and Rhythm: Regular rhythm. Tachycardia present.     Pulses: Normal pulses.     Heart sounds: Normal heart sounds.  Pulmonary:     Effort: Pulmonary effort is normal. No respiratory distress.     Breath sounds: Normal breath sounds. No stridor. No wheezing, rhonchi or rales.  Abdominal:     General: Bowel sounds are normal.     Palpations: Abdomen is soft.     Tenderness: There is abdominal tenderness.  Musculoskeletal:     Cervical back: Normal range of motion.     Thoracic back: Swelling and tenderness present. Decreased range of motion.       Back:  Skin:    General: Skin is warm and dry.  Neurological:     General: No focal deficit present.     Mental Status: She is alert and oriented to person, place, and time.  Psychiatric:        Mood and Affect: Mood normal.        Behavior: Behavior normal.      UC Treatments / Results  Labs (all labs ordered are listed, but only abnormal results are displayed) Labs Reviewed  POCT URINALYSIS DIP (MANUAL ENTRY) - Abnormal; Notable for the following components:      Result Value   Spec Grav, UA <=1.005 (*)    Blood, UA small (*)    All other components within normal limits    EKG   Radiology DG Thoracic Spine 2  View  Result Date: 03/06/2023 CLINICAL DATA:  Nine day history of mid back pain. EXAM: THORACIC SPINE 2 VIEWS COMPARISON:  03/22/2022 FINDINGS: The alignment is within normal limits. The vertebral body heights and disc spaces are well preserved. No signs of acute fracture or subluxation. IMPRESSION: Negative. Electronically Signed   By: Kerby Moors M.D.   On: 03/06/2023 12:17    Procedures Procedures (including critical care time)  Medications Ordered in UC Medications  ketorolac (TORADOL) 30 MG/ML injection 30 mg (30 mg Intramuscular Given 03/06/23 1238)    Initial Impression / Assessment and Plan / UC Course  I have reviewed the triage vital signs and the nursing notes.  Pertinent labs & imaging results that were available during my care of the patient were reviewed by me and considered in my medical decision making (see chart for details).  The patient is well-appearing, she is in  no acute distress, she is tachycardic however.  Difficult to ascertain the cause of the patient's right-sided mid back pain.  Urinalysis does not indicate a urinary tract infection, and it is difficult to determine if the blood in her urine is caused by a recent menstrual cycle or if this is related to a kidney stone.  X-ray of her thoracic spine was negative. Patient does have a significant history of urological concerns.  Patient was given Toradol 30 mg IM for pain.  Lengthy discussion with patient regarding limitations of resources found here in the urgent care.  Patient was advised that if symptoms do not improve or if they worsen, recommend that she follow-up in the emergency department for further evaluation.  Supportive care recommendations were provided to the patient.  Patient verbalizes understanding and is in agreement with this plan of care.  All questions were answered.  Patient stable for discharge.   Final Clinical Impressions(s) / UC Diagnoses   Final diagnoses:  Mid back pain on right side      Discharge Instructions      Your urinalysis does not indicate a clear urinary tract infection.  A urine culture has been ordered.  You will be contacted if the pending test results are positive. Make sure you are drinking at least 8-10 eight ounce glasses of water daily. You have been given an injection of Toradol 30 mg today for pain.  Do not take any additional ibuprofen, Aleve, or other NSAIDs today. May take over-the-counter Tylenol arthritis strength 650 mg tablets as needed to help with pain or discomfort. May apply ice or heat as needed.  Apply ice for pain or swelling, heat for spasm or stiffness.  Apply for 20 minutes, remove for 1 hour, then repeat is much as possible. As discussed, if symptoms do not improve, recommend that you follow-up in the emergency department for further evaluation. Follow-up as needed.     ED Prescriptions   None    PDMP not reviewed this encounter.   Tish Men, NP 03/06/23 1240

## 2023-03-07 ENCOUNTER — Other Ambulatory Visit: Payer: Self-pay

## 2023-03-07 ENCOUNTER — Encounter (HOSPITAL_BASED_OUTPATIENT_CLINIC_OR_DEPARTMENT_OTHER): Payer: Self-pay

## 2023-03-07 ENCOUNTER — Emergency Department (HOSPITAL_BASED_OUTPATIENT_CLINIC_OR_DEPARTMENT_OTHER): Payer: Managed Care, Other (non HMO)

## 2023-03-07 ENCOUNTER — Emergency Department (HOSPITAL_BASED_OUTPATIENT_CLINIC_OR_DEPARTMENT_OTHER)
Admission: EM | Admit: 2023-03-07 | Discharge: 2023-03-07 | Disposition: A | Discharge status: Home / Self Care | Payer: Managed Care, Other (non HMO) | Attending: Emergency Medicine | Admitting: Emergency Medicine

## 2023-03-07 DIAGNOSIS — R109 Unspecified abdominal pain: Secondary | ICD-10-CM | POA: Diagnosis present

## 2023-03-07 DIAGNOSIS — E871 Hypo-osmolality and hyponatremia: Secondary | ICD-10-CM | POA: Diagnosis not present

## 2023-03-07 DIAGNOSIS — D75839 Thrombocytosis, unspecified: Secondary | ICD-10-CM | POA: Diagnosis not present

## 2023-03-07 LAB — COMPREHENSIVE METABOLIC PANEL
ALT: 26 U/L (ref 0–44)
AST: 27 U/L (ref 15–41)
Albumin: 4.8 g/dL (ref 3.5–5.0)
Alkaline Phosphatase: 67 U/L (ref 38–126)
Anion gap: 8 (ref 5–15)
BUN: 5 mg/dL — ABNORMAL LOW (ref 6–20)
CO2: 24 mmol/L (ref 22–32)
Calcium: 9.4 mg/dL (ref 8.9–10.3)
Chloride: 101 mmol/L (ref 98–111)
Creatinine, Ser: 0.54 mg/dL (ref 0.44–1.00)
GFR, Estimated: >60 mL/min (ref >60)
Glucose, Bld: 100 mg/dL — ABNORMAL HIGH (ref 70–99)
Potassium: 4.6 mmol/L (ref 3.5–5.1)
Sodium: 133 mmol/L — ABNORMAL LOW (ref 135–145)
Total Bilirubin: 0.3 mg/dL (ref 0.3–1.2)
Total Protein: 7.7 g/dL (ref 6.5–8.1)

## 2023-03-07 LAB — CBC
HCT: 41.2 % (ref 36.0–46.0)
Hemoglobin: 13.6 g/dL (ref 12.0–15.0)
MCH: 29.1 pg (ref 26.0–34.0)
MCHC: 33 g/dL (ref 30.0–36.0)
MCV: 88.2 fL (ref 80.0–100.0)
Platelets: 402 10*3/uL — ABNORMAL HIGH (ref 150–400)
RBC: 4.67 MIL/uL (ref 3.87–5.11)
RDW: 12.9 % (ref 11.5–15.5)
WBC: 10 10*3/uL (ref 4.0–10.5)
nRBC: 0 % (ref 0.0–0.2)

## 2023-03-07 LAB — URINALYSIS, ROUTINE W REFLEX MICROSCOPIC
Bilirubin Urine: NEGATIVE
Glucose, UA: NEGATIVE mg/dL
Hgb urine dipstick: NEGATIVE
Ketones, ur: NEGATIVE mg/dL
Leukocytes,Ua: NEGATIVE
Nitrite: NEGATIVE
Protein, ur: NEGATIVE mg/dL
Specific Gravity, Urine: <1.005 — ABNORMAL LOW (ref 1.005–1.030)
pH: 6 (ref 5.0–8.0)

## 2023-03-07 LAB — LIPASE, BLOOD: Lipase: 29 U/L (ref 11–51)

## 2023-03-07 LAB — PREGNANCY, URINE: Preg Test, Ur: NEGATIVE

## 2023-03-07 MED ORDER — MORPHINE SULFATE (PF) 4 MG/ML IV SOLN
4.0000 mg | Freq: Once | INTRAVENOUS | Status: AC
  Administered 2023-03-07: 4 mg via INTRAVENOUS
  Filled 2023-03-07: qty 1

## 2023-03-07 MED ORDER — DICYCLOMINE HCL 10 MG PO CAPS
10.0000 mg | ORAL_CAPSULE | Freq: Once | ORAL | Status: AC
  Administered 2023-03-07: 10 mg via ORAL
  Filled 2023-03-07: qty 1

## 2023-03-07 MED ORDER — ONDANSETRON HCL 4 MG/2ML IJ SOLN
4.0000 mg | Freq: Once | INTRAMUSCULAR | Status: AC
  Administered 2023-03-07: 4 mg via INTRAVENOUS
  Filled 2023-03-07: qty 2

## 2023-03-07 MED ORDER — DICYCLOMINE HCL 20 MG PO TABS: 20.0000 mg | ORAL_TABLET | Freq: Two times a day (BID) | ORAL | 0 refills | Status: DC

## 2023-03-07 NOTE — ED Provider Notes (Signed)
[facility] Provider Note   CSN: [id number] Arrival date & time: 03/07/23  1646     History {Add pertinent medical, surgical, social history, OB history to HPI:1} Chief Complaint  Patient presents with   Flank Pain    Kendra Patton is a 37 y.o. female with past medical history significant for von Willebrand, anxiety, IBS, PCOS, eczema, generalized pain, chronic fatigue, who presents with concern for urinary urgency, abdominal cramping, and right flank pain for 9 to 10 days.  Patient reports she had some nausea vomiting, she did have some diarrhea which is resolved at this time.  She denies any cough, sore throat, or other infectious symptoms.   Flank Pain       Home Medications Prior to Admission medications   Medication Sig Start Date End Date Taking? Authorizing Provider  albuterol (VENTOLIN HFA) 108 (90 Base) MCG/ACT inhaler Inhale 2 puffs into the lungs every 4 (four) hours as needed for wheezing or shortness of breath (coughing fits). 03/11/22   Garnet Sierras, DO  amphetamine-dextroamphetamine (ADDERALL XR) 30 MG 24 hr capsule Take 1 capsule (30 mg total) by mouth every morning. 06/01/21   Kennyth Arnold, FNP  EPINEPHrine (AUVI-Q) 0.3 mg/0.3 mL IJ SOAJ injection Inject 0.3 mg into the muscle as needed for anaphylaxis. 05/26/21   Garnet Sierras, DO  hydrOXYzine (ATARAX) 25 MG tablet Take 25-50 mg by mouth at bedtime as needed. 08/20/22   [provider]  lisdexamfetamine (VYVANSE) 70 MG capsule Take 70 mg by mouth daily.    [provider]  Vitamin D, Ergocalciferol, (DRISDOL) 1.25 MG (50000 UNIT) CAPS capsule Take 1 capsule (50,000 Units total) by mouth every 7 (seven) days. 08/31/22   Allwardt, Randa Evens, PA-C      Allergies    Aspirin, Compazine [prochlorperazine edisylate], Other, Penicillins, and Prochlorperazine    Review of Systems   Review of Systems  Genitourinary:  Positive for flank pain.  All other systems  reviewed and are negative.   Physical Exam Updated Vital Signs BP (!) 141/106   Pulse (!) 122   Temp 97.7 F (36.5 C) (Oral)   Resp 20   Ht 5\' 7"  (1.702 m)   Wt 93 kg   LMP 02/26/2023 (Exact Date)   SpO2 100%   BMI 32.11 kg/m  Physical Exam Vitals and nursing note reviewed.  Constitutional:      General: She is not in acute distress.    Appearance: Normal appearance.  HENT:     Head: Normocephalic and atraumatic.  Eyes:     General:        Right eye: No discharge.        Left eye: No discharge.  Cardiovascular:     Rate and Rhythm: Normal rate and regular rhythm.     Heart sounds: No murmur heard.    No friction rub. No gallop.  Pulmonary:     Effort: Pulmonary effort is normal.     Breath sounds: Normal breath sounds.  Abdominal:     General: Bowel sounds are normal.     Palpations: Abdomen is soft.     Comments: Some tenderness in right suprapubic region, no mcburney's point tenderness, no rebound, rigidity, guarding  Musculoskeletal:     Comments: Focal tenderness of right flank, no rebound, rigidity, guarding  Skin:    General: Skin is warm and dry.     Capillary Refill: Capillary refill takes less than 2 seconds.  Neurological:  Mental Status: She is alert and oriented to person, place, and time.  Psychiatric:        Mood and Affect: Mood normal.        Behavior: Behavior normal.     ED Results / Procedures / Treatments   Labs (all labs ordered are listed, but only abnormal results are displayed) Labs Reviewed  CBC - Abnormal; Notable for the following components:      Result Value   Platelets 402 (*)    All other components within normal limits  URINALYSIS, ROUTINE W REFLEX MICROSCOPIC - Abnormal; Notable for the following components:   Color, Urine COLORLESS (*)    Specific Gravity, Urine <1.005 (*)    All other components within normal limits  PREGNANCY, URINE  LIPASE, BLOOD  COMPREHENSIVE METABOLIC PANEL    EKG None  Radiology DG  Thoracic Spine 2 View  Result Date: 03/06/2023 CLINICAL DATA:  Nine day history of mid back pain. EXAM: THORACIC SPINE 2 VIEWS COMPARISON:  03/22/2022 FINDINGS: The alignment is within normal limits. The vertebral body heights and disc spaces are well preserved. No signs of acute fracture or subluxation. IMPRESSION: Negative. Electronically Signed   By: Kerby Moors M.D.   On: 03/06/2023 12:17    Procedures Procedures  {Document cardiac monitor, telemetry assessment procedure when appropriate:1}  Medications Ordered in ED Medications  morphine (PF) 4 MG/ML injection 4 mg (has no administration in time range)  ondansetron (ZOFRAN) injection 4 mg (has no administration in time range)    ED Course/ Medical Decision Making/ A&P   {   Click here for ABCD2, HEART and other calculatorsREFRESH Note before signing :1}                          Medical Decision Making Amount and/or Complexity of Data Reviewed Labs: ordered. Radiology: ordered.  Risk Prescription drug management.   This patient is a 37 y.o. female  who presents to the ED for concern of ***.   Differential diagnoses prior to evaluation: The emergent differential diagnosis includes, but is not limited to,  *** . This is not an exhaustive differential.   Past Medical History / Co-morbidities: ***  Additional history: Chart reviewed. Pertinent results include: ***  Physical Exam: Physical exam performed. The pertinent findings include: ***  Lab Tests/Imaging studies: I personally interpreted labs/imaging and the pertinent results include:  ***. ***I agree with the radiologist interpretation.  Cardiac monitoring: EKG obtained and interpreted by my attending physician which shows: ***   Medications: I ordered medication including ***.  I have reviewed the patients home medicines and have made adjustments as needed.   Disposition: After consideration of the diagnostic results and the patients response to treatment,  I feel that *** .   ***emergency department workup does not suggest an emergent condition requiring admission or immediate intervention beyond what has been performed at this time. The plan is: ***. The patient is safe for discharge and has been instructed to return immediately for worsening symptoms, change in symptoms or any other concerns.  Final Clinical Impression(s) / ED Diagnoses Final diagnoses:  None    Rx / DC Orders ED Discharge Orders     None

## 2023-03-07 NOTE — ED Triage Notes (Signed)
Patient here POV from Home.  Endorses Urgency that began 9-10 Days ago. Notes soon afterwards began to have ABD Cramping that has since subsided. Noted right Flank pain that has worsened since.   Some N/V. No Longer having Diarrhea. No known Fever.   NAD Noted during Triage. A&Ox4. GCS 15. Ambulatory.

## 2023-03-07 NOTE — Discharge Instructions (Signed)
Please use Tylenol or ibuprofen for pain.  You may use 600 mg ibuprofen every 6 hours or 1000 mg of Tylenol every 6 hours.  You may choose to alternate between the 2.  This would be most effective.  Not to exceed 4 g of Tylenol within 24 hours.  Not to exceed 3200 mg ibuprofen 24 hours.  You can use Bentyl in addition to the above to help with pain.  I recommend following up with your PCP and GI doctor since we did not find a clear explanation for the pain that you are having today.

## 2023-03-07 NOTE — ED Notes (Signed)
Patient given discharge instructions. Questions were answered. Patient verbalized understanding of discharge instructions and care at home. ? ?Discharged with family ?

## 2023-03-18 ENCOUNTER — Encounter: Payer: Self-pay | Admitting: Gastroenterology

## 2023-05-23 ENCOUNTER — Other Ambulatory Visit: Payer: Self-pay

## 2023-05-23 ENCOUNTER — Encounter: Payer: Self-pay | Admitting: Physician Assistant

## 2023-05-23 MED ORDER — VALACYCLOVIR HCL 1 G PO TABS: ORAL_TABLET | ORAL | 0 refills | Status: DC

## 2023-05-23 NOTE — Telephone Encounter (Signed)
Please see pt msg and advise 

## 2023-06-19 ENCOUNTER — Other Ambulatory Visit: Payer: Self-pay

## 2023-06-19 ENCOUNTER — Ambulatory Visit
Admission: EM | Admit: 2023-06-19 | Discharge: 2023-06-19 | Disposition: A | Discharge status: Home / Self Care | Payer: Managed Care, Other (non HMO) | Attending: Family Medicine | Admitting: Family Medicine

## 2023-06-19 ENCOUNTER — Encounter: Payer: Self-pay | Admitting: Emergency Medicine

## 2023-06-19 DIAGNOSIS — J029 Acute pharyngitis, unspecified: Secondary | ICD-10-CM | POA: Insufficient documentation

## 2023-06-19 DIAGNOSIS — J069 Acute upper respiratory infection, unspecified: Secondary | ICD-10-CM | POA: Insufficient documentation

## 2023-06-19 LAB — POCT RAPID STREP A (OFFICE): Rapid Strep A Screen: NEGATIVE

## 2023-06-19 LAB — POCT MONO SCREEN (KUC): Mono, POC: NEGATIVE

## 2023-06-19 MED ORDER — LIDOCAINE VISCOUS HCL 2 % MT SOLN: 10.0000 mL | OROMUCOSAL | 0 refills | Status: DC | PRN

## 2023-06-19 NOTE — ED Provider Notes (Signed)
[facility]    CSN: [id number] Arrival date & time: 06/19/23  0941      History   Chief Complaint Chief Complaint  Patient presents with   Sore Throat    HPI Kendra Patton is a 37 y.o. female.   Patient presenting today with 3-day history of headache, sore throat, body aches, fatigue, chills.  Denies chest pain, shortness of breath, abdominal pain, nausea vomiting or diarrhea.  So far trying some over-the-counter remedies with minimal relief.  Recent strep exposure so wanting to be tested.    Past Medical History:  Diagnosis Date   Anxiety    age 23   Asthma    COVID 04/2021   Depression    Eczema    High-risk pregnancy supervision 10/27/2015   History of CMV    last pregnancy   History of OCD (obsessive compulsive disorder)    IBS (irritable bowel syndrome)    Scoliosis    Von Willebrand disease (HCC)     Patient Active Problem List   Diagnosis Date Noted   Change in bowel habit 09/22/2022   Flatulence, eructation and gas pain 09/22/2022   History of blood disorder 09/22/2022   Left lower quadrant pain 09/22/2022   Generalized pain 09/10/2022   Chronic fatigue 09/10/2022   Flecks of blood in stool 09/10/2022   Iron deficiency anemia 09/10/2022   Not well controlled moderate persistent asthma 03/11/2022   Other adverse food reactions, not elsewhere classified, subsequent encounter 05/26/2021   Oral allergy syndrome, subsequent encounter 05/26/2021   Dyshidrotic eczema 05/26/2021   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   Polycystic ovary syndrome 12/24/2019   Vaginal delivery 02/29/2016   Anxiety - dx'd at age 39 02/28/2016   Eczema 02/28/2016   IBS (irritable bowel syndrome) 02/28/2016   Scoliosis 02/28/2016   Seasonal and perennial allergic rhinoconjunctivitis 02/28/2016   Allergy history, drug -- Compazine (dystonic rxn; seizures) 02/27/2016   High-risk pregnancy supervision 10/27/2015   Von Willebrand disease,  type I (HCC) 07/11/2015   Aspirin allergy 07/11/2015    Past Surgical History:  Procedure Laterality Date   COLONOSCOPY     UPPER GASTROINTESTINAL ENDOSCOPY     WISDOM TOOTH EXTRACTION      OB History     Gravida  2   Para  2   Term  2   Preterm      AB      Living  2      SAB      IAB      Ectopic      Multiple  0   Live Births  2            Home Medications    Prior to Admission medications   Medication Sig Start Date End Date Taking? Authorizing Provider  lidocaine (XYLOCAINE) 2 % solution Use as directed 10 mLs in the mouth or throat every 3 (three) hours as needed for mouth pain. 06/19/23  Yes Particia Nearing, PA-C  albuterol (VENTOLIN HFA) 108 (90 Base) MCG/ACT inhaler Inhale 2 puffs into the lungs every 4 (four) hours as needed for wheezing or shortness of breath (coughing fits). 03/11/22   Ellamae Sia, DO  amphetamine-dextroamphetamine (ADDERALL XR) 30 MG 24 hr capsule Take 1 capsule (30 mg total) by mouth every morning. 06/01/21   Eulis Foster, FNP  dicyclomine (BENTYL) 20 MG tablet Take 1 tablet (20 mg total) by mouth 2 (two) times daily. 03/07/23  Prosperi, Christian H, PA-C  EPINEPHrine (AUVI-Q) 0.3 mg/0.3 mL IJ SOAJ injection Inject 0.3 mg into the muscle as needed for anaphylaxis. 05/26/21   Ellamae Sia, DO  hydrOXYzine (ATARAX) 25 MG tablet Take 25-50 mg by mouth at bedtime as needed. 08/20/22   [provider]  lisdexamfetamine (VYVANSE) 70 MG capsule Take 70 mg by mouth daily.    [provider]  valACYclovir (VALTREX) 1000 MG tablet Valtrex 2 g twice daily x 1 day(so 2 1000mg ) #4 05/23/23   Jeani Sow, MD  Vitamin D, Ergocalciferol, (DRISDOL) 1.25 MG (50000 UNIT) CAPS capsule Take 1 capsule (50,000 Units total) by mouth every 7 (seven) days. 08/31/22   Allwardt, Crist Infante, PA-C    Family History Family History  Problem Relation Age of Onset   Asthma Mother    Hypertension Mother    Cancer Mother        Type of  Skin Cancer   Diabetes Mother    Depression Mother    Heart disease Mother    Varicose Veins Mother    Multiple sclerosis Mother    Cancer Father        Skin   Deep vein thrombosis Father    Asthma Sister    Hypertension Sister    Depression Sister    Learning disabilities Sister    Varicose Veins Sister    Bronchitis Sister    Asthma Sister    Depression Sister    Bronchitis Sister    Colon cancer Maternal Uncle    Cancer Maternal Grandmother    Cancer Paternal Grandfather        lung   Deafness Daughter    Eczema Nephew    Food Allergy Nephew    Bronchitis Nephew    Eczema Niece    Food Allergy Niece    Bronchitis Niece    Colon cancer Other    Urticaria Neg Hx    Atopy Neg Hx    Angioedema Neg Hx    Esophageal cancer Neg Hx    Stomach cancer Neg Hx    Rectal cancer Neg Hx     Social History Social History   Tobacco Use   Smoking status: Former    Types: Cigarettes    Quit date: 2010    Years since quitting: 14.5   Smokeless tobacco: Never   Tobacco comments:    on and off socially   Vaping Use   Vaping Use: Every day   Substances: Nicotine  Substance Use Topics   Alcohol use: Yes    Alcohol/week: 0.0 standard drinks of alcohol    Comment: 2 seltzer beers per day   Drug use: No     Allergies   Aspirin, Compazine [prochlorperazine edisylate], Other, Penicillins, and Prochlorperazine   Review of Systems Review of Systems Per HPI  Physical Exam Triage Vital Signs ED Triage Vitals  Enc Vitals Group     BP 06/19/23 1033 (!) 136/91     Pulse Rate 06/19/23 1033 (!) 117     Resp 06/19/23 1033 20     Temp 06/19/23 1033 98.3 F (36.8 C)     Temp Source 06/19/23 1033 Oral     SpO2 06/19/23 1033 98 %     Weight --      Height --      Head Circumference --      Peak Flow --      Pain Score 06/19/23 1032 7     Pain Loc --  Pain Edu? --      Excl. in GC? --    No data found.  Updated Vital Signs BP (!) 136/91 (BP Location: Right Arm)    Pulse (!) 117   Temp 98.3 F (36.8 C) (Oral)   Resp 20   LMP 05/25/2023 (Approximate)   SpO2 98%   Visual Acuity Right Eye Distance:   Left Eye Distance:   Bilateral Distance:    Right Eye Near:   Left Eye Near:    Bilateral Near:     Physical Exam Vitals and nursing note reviewed.  Constitutional:      Appearance: Normal appearance.  HENT:     Head: Atraumatic.     Right Ear: Tympanic membrane and external ear normal.     Left Ear: Tympanic membrane and external ear normal.     Nose: Rhinorrhea present.     Mouth/Throat:     Mouth: Mucous membranes are moist.     Pharynx: Posterior oropharyngeal erythema present. No oropharyngeal exudate.  Eyes:     Extraocular Movements: Extraocular movements intact.     Conjunctiva/sclera: Conjunctivae normal.  Cardiovascular:     Rate and Rhythm: Normal rate and regular rhythm.     Heart sounds: Normal heart sounds.  Pulmonary:     Effort: Pulmonary effort is normal.     Breath sounds: Normal breath sounds. No wheezing or rales.  Musculoskeletal:        General: Normal range of motion.     Cervical back: Normal range of motion and neck supple.  Lymphadenopathy:     Cervical: Cervical adenopathy present.  Skin:    General: Skin is warm and dry.  Neurological:     Mental Status: She is alert and oriented to person, place, and time.  Psychiatric:        Mood and Affect: Mood normal.        Thought Content: Thought content normal.      UC Treatments / Results  Labs (all labs ordered are listed, but only abnormal results are displayed) Labs Reviewed  SARS CORONAVIRUS 2 (TAT 6-24 HRS)  CULTURE, GROUP A STREP [facility])  POCT RAPID STREP A (OFFICE)  POCT MONO SCREEN ([facility])    EKG   Radiology No results found.  Procedures Procedures (including critical care time)  Medications Ordered in UC Medications - No data to display  Initial Impression / Assessment and Plan / UC Course  I have reviewed the triage vital signs  and the nursing notes.  Pertinent labs & imaging results that were available during my care of the patient were reviewed by me and considered in my medical decision making (see chart for details).     Rapid strep negative, rapid mono negative, throat culture and COVID testing pending.  Treat with over-the-counter cold and congestion medications, viscous lidocaine, supportive home care.  Return for worsening symptoms.  Final Clinical Impressions(s) / UC Diagnoses   Final diagnoses:  Viral URI  Acute pharyngitis, unspecified etiology   Discharge Instructions   None    ED Prescriptions     Medication Sig Dispense Auth. Provider   lidocaine (XYLOCAINE) 2 % solution Use as directed 10 mLs in the mouth or throat every 3 (three) hours as needed for mouth pain. 100 mL Particia Nearing, [place]      PDMP not reviewed this encounter.   Particia Nearing, [place] 06/19/23 1136

## 2023-06-19 NOTE — ED Triage Notes (Signed)
Pt reports headache, sore throat, generalized body aches since Friday. Reports recent exposure to strep.

## 2023-06-20 LAB — SARS CORONAVIRUS 2 (TAT 6-24 HRS): SARS Coronavirus 2: NEGATIVE

## 2023-06-21 ENCOUNTER — Ambulatory Visit: Payer: Managed Care, Other (non HMO) | Attending: Physician Assistant

## 2023-06-21 ENCOUNTER — Encounter: Payer: Self-pay | Admitting: Physician Assistant

## 2023-06-21 ENCOUNTER — Ambulatory Visit (INDEPENDENT_AMBULATORY_CARE_PROVIDER_SITE_OTHER): Payer: Managed Care, Other (non HMO) | Admitting: Physician Assistant

## 2023-06-21 VITALS — BP 134/87 | HR 120 | Temp 98.0°F | Ht 67.0 in | Wt 216.4 lb

## 2023-06-21 DIAGNOSIS — D509 Iron deficiency anemia, unspecified: Secondary | ICD-10-CM

## 2023-06-21 DIAGNOSIS — R000 Tachycardia, unspecified: Secondary | ICD-10-CM | POA: Diagnosis not present

## 2023-06-21 DIAGNOSIS — R002 Palpitations: Secondary | ICD-10-CM | POA: Diagnosis not present

## 2023-06-21 DIAGNOSIS — Z9229 Personal history of other drug therapy: Secondary | ICD-10-CM | POA: Insufficient documentation

## 2023-06-21 DIAGNOSIS — J45909 Unspecified asthma, uncomplicated: Secondary | ICD-10-CM | POA: Insufficient documentation

## 2023-06-21 DIAGNOSIS — E559 Vitamin D deficiency, unspecified: Secondary | ICD-10-CM | POA: Diagnosis not present

## 2023-06-21 LAB — CBC WITH DIFFERENTIAL/PLATELET
Basophils Absolute: 0.1 10*3/uL (ref 0.0–0.1)
Basophils Relative: 1.2 % (ref 0.0–3.0)
Eosinophils Absolute: 0.1 10*3/uL (ref 0.0–0.7)
Eosinophils Relative: 2 % (ref 0.0–5.0)
HCT: 36.2 % (ref 36.0–46.0)
Hemoglobin: 11.8 g/dL — ABNORMAL LOW (ref 12.0–15.0)
Lymphocytes Relative: 37.3 % (ref 12.0–46.0)
Lymphs Abs: 2.4 10*3/uL (ref 0.7–4.0)
MCHC: 32.5 g/dL (ref 30.0–36.0)
MCV: 80.3 fl (ref 78.0–100.0)
Monocytes Absolute: 0.5 10*3/uL (ref 0.1–1.0)
Monocytes Relative: 8.3 % (ref 3.0–12.0)
Neutro Abs: 3.3 10*3/uL (ref 1.4–7.7)
Neutrophils Relative %: 51.2 % (ref 43.0–77.0)
Platelets: 365 10*3/uL (ref 150.0–400.0)
RBC: 4.51 Mil/uL (ref 3.87–5.11)
RDW: 14.8 % (ref 11.5–15.5)
WBC: 6.5 10*3/uL (ref 4.0–10.5)

## 2023-06-21 LAB — TSH: TSH: 2.43 u[IU]/mL (ref 0.35–5.50)

## 2023-06-21 LAB — COMPREHENSIVE METABOLIC PANEL
ALT: 24 U/L (ref 0–35)
AST: 21 U/L (ref 0–37)
Albumin: 4.2 g/dL (ref 3.5–5.2)
Alkaline Phosphatase: 74 U/L (ref 39–117)
BUN: 9 mg/dL (ref 6–23)
CO2: 24 mEq/L (ref 19–32)
Calcium: 9.6 mg/dL (ref 8.4–10.5)
Chloride: 102 mEq/L (ref 96–112)
Creatinine, Ser: 0.62 mg/dL (ref 0.40–1.20)
GFR: 114.56 mL/min (ref >60.00)
Glucose, Bld: 96 mg/dL (ref 70–99)
Potassium: 4.4 mEq/L (ref 3.5–5.1)
Sodium: 136 mEq/L (ref 135–145)
Total Bilirubin: 0.4 mg/dL (ref 0.2–1.2)
Total Protein: 7 g/dL (ref 6.0–8.3)

## 2023-06-21 LAB — IBC + FERRITIN
Ferritin: 5.7 ng/mL — ABNORMAL LOW (ref 10.0–291.0)
Iron: 29 ug/dL — ABNORMAL LOW (ref 42–145)
Saturation Ratios: 5.1 % — ABNORMAL LOW (ref 20.0–50.0)
TIBC: 571.2 ug/dL — ABNORMAL HIGH (ref 250.0–450.0)
Transferrin: 408 mg/dL — ABNORMAL HIGH (ref 212.0–360.0)

## 2023-06-21 LAB — VITAMIN D 25 HYDROXY (VIT D DEFICIENCY, FRACTURES): VITD: 19.67 ng/mL — ABNORMAL LOW (ref 30.00–100.00)

## 2023-06-21 NOTE — Progress Notes (Signed)
Subjective:    Patient ID: Kendra Patton, female    DOB: 04/04/1986, 37 y.o.   MRN: [id number]  Chief Complaint  Patient presents with   Hypertension    Pt has noticed heart rate while resting is high and been this way for years; pt also states BP has been running high as well; pt gets spells of being lightheaded at times no passing out as well. Pt is keeping up with it using smart watch.     HPI Patient is in today for concerns about HR and blood pressure. Feeling palpitations randomly. Sometimes feels dizzy. No chest pain or pressure. Symptoms were worse in pregnancy and she was fainting. Even in high school they told her to drink more Gatorade because of HR issues.   She's been monitoring HR - sometimes goes as high as 157 just sitting.  Usually drops before lunch.  BP running higher than normal as well, sometimes 160.  Works in a daycare. Says she can come back for labs later today if needed. Craving ice again and worried about her anemia being worse.   No other symptoms to report today.   Past Medical History:  Diagnosis Date   Anxiety    age 76   Asthma    COVID 04/2021   Depression    Eczema    High-risk pregnancy supervision 10/27/2015   History of CMV    last pregnancy   History of OCD (obsessive compulsive disorder)    IBS (irritable bowel syndrome)    Scoliosis    Von Willebrand disease (HCC)     Past Surgical History:  Procedure Laterality Date   COLONOSCOPY     UPPER GASTROINTESTINAL ENDOSCOPY     WISDOM TOOTH EXTRACTION      Family History  Problem Relation Age of Onset   Asthma Mother    Hypertension Mother    Cancer Mother        Type of Skin Cancer   Diabetes Mother    Depression Mother    Heart disease Mother    Varicose Veins Mother    Multiple sclerosis Mother    Cancer Father        Skin   Deep vein thrombosis Father    Asthma Sister    Hypertension Sister    Depression Sister    Learning disabilities Sister    Varicose Veins  Sister    Bronchitis Sister    Asthma Sister    Depression Sister    Bronchitis Sister    Colon cancer Maternal Uncle    Cancer Maternal Grandmother    Cancer Paternal Grandfather        lung   Deafness Daughter    Eczema Nephew    Food Allergy Nephew    Bronchitis Nephew    Eczema Niece    Food Allergy Niece    Bronchitis Niece    Colon cancer Other    Urticaria Neg Hx    Atopy Neg Hx    Angioedema Neg Hx    Esophageal cancer Neg Hx    Stomach cancer Neg Hx    Rectal cancer Neg Hx     Social History   Tobacco Use   Smoking status: Former    Types: Cigarettes    Quit date: 2010    Years since quitting: 14.5   Smokeless tobacco: Never   Tobacco comments:    on and off socially   Vaping Use   Vaping Use: Every day  Substances: Nicotine  Substance Use Topics   Alcohol use: Yes    Alcohol/week: 0.0 standard drinks of alcohol    Comment: 2 seltzer beers per day   Drug use: No     Allergies  Allergen Reactions   Aspirin Other (See Comments)    "Thins bloods too much"   Compazine [Prochlorperazine Edisylate]     Dystonic.   Other     Compazine   Penicillins     Unknown reaction   Prochlorperazine     Other reaction(s): Unknown    Review of Systems NEGATIVE UNLESS OTHERWISE INDICATED IN HPI      Objective:     BP 134/87 (BP Location: Left Arm)   Pulse (!) 120   Temp 98 F (36.7 C) (Temporal)   Ht 5\' 7"  (1.702 m)   Wt 216 lb 6.4 oz (98.2 kg)   LMP 05/25/2023 (Approximate)   SpO2 99%   BMI 33.89 kg/m   Wt Readings from Last 3 Encounters:  06/21/23 216 lb 6.4 oz (98.2 kg)  03/07/23 205 lb 0.4 oz (93 kg)  03/02/23 205 lb (93 kg)    BP Readings from Last 3 Encounters:  06/21/23 134/87  06/19/23 (!) 136/91  03/07/23 136/80     Physical Exam Vitals and nursing note reviewed.  Constitutional:      Appearance: Normal appearance. She is obese. She is not toxic-appearing.  HENT:     Head: Normocephalic and atraumatic.  Eyes:      Extraocular Movements: Extraocular movements intact.     Conjunctiva/sclera: Conjunctivae normal.     Pupils: Pupils are equal, round, and reactive to light.  Cardiovascular:     Rate and Rhythm: Regular rhythm. Tachycardia present.     Pulses: Normal pulses.     Heart sounds: Normal heart sounds. No murmur heard. Pulmonary:     Effort: Pulmonary effort is normal. No respiratory distress.     Breath sounds: Normal breath sounds.  Musculoskeletal:        General: Normal range of motion.     Cervical back: Normal range of motion and neck supple.  Skin:    General: Skin is warm and dry.  Neurological:     General: No focal deficit present.     Mental Status: She is alert and oriented to person, place, and time.  Psychiatric:        Mood and Affect: Mood normal.        Behavior: Behavior normal.        Thought Content: Thought content normal.        Judgment: Judgment normal.        Assessment & Plan:  Palpitations -     EKG 12-Lead -     TSH -     Comprehensive metabolic panel -     CBC with Differential/Platelet -     IBC + Ferritin -     LONG TERM MONITOR (3-14 DAYS); Future -     Ambulatory referral to Cardiology  Tachycardia -     EKG 12-Lead -     LONG TERM MONITOR (3-14 DAYS); Future -     Ambulatory referral to Cardiology  Iron deficiency anemia, unspecified iron deficiency anemia type -     CBC with Differential/Platelet -     IBC + Ferritin  Vitamin D deficiency -     VITAMIN D 25 Hydroxy (Vit-D Deficiency, Fractures)    Well-appearing on exam. No acute chest pain or palpitations. Her orthostatic  vitals were positive. She may have undiagnosed POTS. Plan to refer to cardiology for additional work-up. Will order labs and Zio patch in meantime.  EKG today - per my interpretation, Sinus Tachycardia, 117 bpm, no signs of ischemia.   Hx of iron deficiency. Also having return of symptoms lately, could be contributing to some of her symptoms. Will recheck labs.  She needs to continue an iron supplement daily.  Same for Vit D.   If any acute, worsening symptoms, pt knows to go to the ED.   She is agreeable with plan today.     Return if symptoms worsen or fail to improve.      M , PA-C

## 2023-06-21 NOTE — Progress Notes (Unsigned)
Enrolled for [employer] to mail a ZIO XT long term holter monitor to the patients address on file.   DOD to read. 

## 2023-06-22 ENCOUNTER — Telehealth: Payer: Self-pay | Admitting: *Deleted

## 2023-06-22 ENCOUNTER — Other Ambulatory Visit: Payer: Self-pay

## 2023-06-22 LAB — CULTURE, GROUP A STREP (THRC)

## 2023-06-22 MED ORDER — VITAMIN D (ERGOCALCIFEROL) 1.25 MG (50000 UNIT) PO CAPS: 50000.0000 [IU] | ORAL_CAPSULE | ORAL | 0 refills | Status: DC

## 2023-06-22 NOTE — Telephone Encounter (Signed)
LVM that her provider said her iron is low again and she needs to have iron infusions again. Last seen here 2023 w/feraheme x 2 doses. She did not return for F/U.  Forwarded request for NP to review and advise.

## 2023-06-22 NOTE — Telephone Encounter (Signed)
Per NP: Schedule for 7/15 at 1 pm for lab and OV at 1:15 pm. Will discuss and then schedule iron infusions.

## 2023-06-22 NOTE — Addendum Note (Signed)
Addended by: Wandalee Ferdinand on: 06/22/2023 04:08 PM   Modules accepted: Orders

## 2023-06-24 ENCOUNTER — Ambulatory Visit: Payer: Managed Care, Other (non HMO) | Admitting: Physician Assistant

## 2023-06-25 DIAGNOSIS — R002 Palpitations: Secondary | ICD-10-CM | POA: Diagnosis not present

## 2023-06-25 DIAGNOSIS — R000 Tachycardia, unspecified: Secondary | ICD-10-CM

## 2023-07-04 ENCOUNTER — Encounter: Payer: Self-pay | Admitting: Nurse Practitioner

## 2023-07-04 ENCOUNTER — Inpatient Hospital Stay (HOSPITAL_BASED_OUTPATIENT_CLINIC_OR_DEPARTMENT_OTHER): Payer: Managed Care, Other (non HMO) | Admitting: Nurse Practitioner

## 2023-07-04 ENCOUNTER — Inpatient Hospital Stay: Payer: Managed Care, Other (non HMO) | Attending: Nurse Practitioner

## 2023-07-04 VITALS — BP 134/95 | HR 113 | Temp 97.6°F | Resp 18 | Wt 214.9 lb

## 2023-07-04 DIAGNOSIS — D509 Iron deficiency anemia, unspecified: Secondary | ICD-10-CM | POA: Diagnosis not present

## 2023-07-04 DIAGNOSIS — Z79899 Other long term (current) drug therapy: Secondary | ICD-10-CM | POA: Insufficient documentation

## 2023-07-04 DIAGNOSIS — G4700 Insomnia, unspecified: Secondary | ICD-10-CM | POA: Diagnosis not present

## 2023-07-04 DIAGNOSIS — F909 Attention-deficit hyperactivity disorder, unspecified type: Secondary | ICD-10-CM | POA: Insufficient documentation

## 2023-07-04 DIAGNOSIS — D6801 Von willebrand disease, type 1: Secondary | ICD-10-CM | POA: Diagnosis not present

## 2023-07-04 DIAGNOSIS — G2581 Restless legs syndrome: Secondary | ICD-10-CM | POA: Insufficient documentation

## 2023-07-04 DIAGNOSIS — J45909 Unspecified asthma, uncomplicated: Secondary | ICD-10-CM | POA: Diagnosis not present

## 2023-07-04 DIAGNOSIS — N920 Excessive and frequent menstruation with regular cycle: Secondary | ICD-10-CM | POA: Diagnosis not present

## 2023-07-04 LAB — FERRITIN: Ferritin: 5 ng/mL — ABNORMAL LOW (ref 11–307)

## 2023-07-04 LAB — CBC WITH DIFFERENTIAL (CANCER CENTER ONLY)
Abs Immature Granulocytes: 0.03 10*3/uL (ref 0.00–0.07)
Basophils Absolute: 0.1 10*3/uL (ref 0.0–0.1)
Basophils Relative: 1 %
Eosinophils Absolute: 0.1 10*3/uL (ref 0.0–0.5)
Eosinophils Relative: 1 %
HCT: 34.3 % — ABNORMAL LOW (ref 36.0–46.0)
Hemoglobin: 11.1 g/dL — ABNORMAL LOW (ref 12.0–15.0)
Immature Granulocytes: 0 %
Lymphocytes Relative: 33 %
Lymphs Abs: 2.5 10*3/uL (ref 0.7–4.0)
MCH: 25.9 pg — ABNORMAL LOW (ref 26.0–34.0)
MCHC: 32.4 g/dL (ref 30.0–36.0)
MCV: 80.1 fL (ref 80.0–100.0)
Monocytes Absolute: 0.4 10*3/uL (ref 0.1–1.0)
Monocytes Relative: 5 %
Neutro Abs: 4.5 10*3/uL (ref 1.7–7.7)
Neutrophils Relative %: 60 %
Platelet Count: 441 10*3/uL — ABNORMAL HIGH (ref 150–400)
RBC: 4.28 MIL/uL (ref 3.87–5.11)
RDW: 13.4 % (ref 11.5–15.5)
WBC Count: 7.6 10*3/uL (ref 4.0–10.5)
nRBC: 0 % (ref 0.0–0.2)

## 2023-07-04 LAB — SAVE SMEAR(SSMR), FOR PROVIDER SLIDE REVIEW

## 2023-07-04 NOTE — Progress Notes (Signed)
  [facility] OFFICE PROGRESS NOTE   Diagnosis: Iron deficiency anemia, von Willebrand disease type I  INTERVAL HISTORY:   Kendra Patton returns for a follow-up visit.  She received Feraheme 510 mg IV on 09/29/2022 and 10/06/2022.  She recently contacted the office reporting low iron levels and requested IV iron.  Labs from 06/21/2023 show hemoglobin 11.8, MCV 80, ferritin 5.  She reports tolerating the previous IV iron well.  She is unable to tolerate oral iron due to abdominal pain.  She recently began craving ice again and noted an increase in restless leg symptoms.  The symptoms occurred with iron deficiency previously.  Menstrual cycle continues to be heavy for at least 5 days.  She passes blood clots.  She has bleeding randomly between cycles as well.  We referred her to gynecology following the last office visit.  She was unable to keep that appointment due to her work schedule.  She reports a history of intermittent tachycardia dating back many years.  She is scheduled to see cardiology later this week.  Objective:  Vital signs in last 24 hours:  Blood pressure (!) 134/95, pulse (!) 113, temperature 97.6 F (36.4 C), temperature source Temporal, resp. rate 18, weight 214 lb 14.4 oz (97.5 kg), last menstrual period 05/25/2023, SpO2 100%.    HEENT: No thrush or ulcers. Resp: Lungs clear bilaterally. Cardio: Regular, tachycardia.   GI: No hepatosplenomegaly. Vascular: No leg edema.   Lab Results:  Lab Results  Component Value Date   WBC 6.5 06/21/2023   HGB 11.8 (L) 06/21/2023   HCT 36.2 06/21/2023   MCV 80.3 06/21/2023   PLT 365.0 06/21/2023   NEUTROABS 3.3 06/21/2023    Imaging:  No results found.  Medications: I have reviewed the patient's current medications.  Assessment/Plan: Iron deficiency anemia 08/27/2022 ferritin 4 Feraheme 510 mg 09/29/2022 and 10/06/2022 Menorrhagia  Von Willebrand's type I Allergic asthma ADHD Insomnia  Disposition:  Ms. Hissong has recurrent iron deficiency anemia.  This is likely due to menorrhagia.  She is unable to tolerate oral iron.  She has received IV iron in the past and tolerated well.  She understands the chance of a severe allergic reaction.  She would like to proceed with IV iron.    She has a history of type I von Willebrand's and has taken DDAVP in the past.  She is not interested in taking DDAVP.  She will try to reschedule the gynecology appointment.  She will return for lab and follow-up in about 6 weeks.    Kendra Patton ANP/GNP-BC   07/04/2023  1:20 PM

## 2023-07-05 ENCOUNTER — Encounter: Payer: Self-pay | Admitting: Nurse Practitioner

## 2023-07-06 ENCOUNTER — Ambulatory Visit: Payer: Managed Care, Other (non HMO) | Admitting: Cardiology

## 2023-07-06 ENCOUNTER — Encounter: Payer: Self-pay | Admitting: Nurse Practitioner

## 2023-07-06 ENCOUNTER — Encounter: Payer: Self-pay | Admitting: Cardiology

## 2023-07-06 VITALS — BP 151/102 | HR 116 | Ht 67.0 in | Wt 216.0 lb

## 2023-07-06 DIAGNOSIS — I10 Essential (primary) hypertension: Secondary | ICD-10-CM

## 2023-07-06 DIAGNOSIS — R002 Palpitations: Secondary | ICD-10-CM | POA: Insufficient documentation

## 2023-07-06 MED ORDER — METOPROLOL TARTRATE 25 MG PO TABS: 25.0000 mg | ORAL_TABLET | Freq: Two times a day (BID) | ORAL | 3 refills | Status: DC

## 2023-07-06 NOTE — Progress Notes (Signed)
Patient referred by Allwardt, Crist Infante, PA-C for palpitations  Subjective:   Kendra Patton, female    DOB: June 04, 1986, 37 y.o.   MRN: [id number]   Chief Complaint  Patient presents with   Palpitations   New Patient (Initial Visit)   Tachycardia     HPI  37 y.o. Caucasian female with Iron deficiency anemia, von Willebrand disease type I, palpitations, tachycardia  Patient works as a Engineer, site. Her job is very stressful. She has noticed that her heart rate is much elevated when she is stressed. At other times, heart rate has been in 50s, when she is at home. Sometimes, she has episodes of chest pain across her chest. She has noticed that her heart rate jumps u[p when she stands up, although it is not reflective in the orthostatic vitals today.    Past Medical History:  Diagnosis Date   Anxiety    age 41   Asthma    COVID 04/2021   Depression    Eczema    High-risk pregnancy supervision 10/27/2015   History of CMV    last pregnancy   History of OCD (obsessive compulsive disorder)    IBS (irritable bowel syndrome)    Scoliosis    Von Willebrand disease (HCC)      Past Surgical History:  Procedure Laterality Date   COLONOSCOPY     UPPER GASTROINTESTINAL ENDOSCOPY     WISDOM TOOTH EXTRACTION       Social History   Tobacco Use  Smoking Status Former   Current packs/day: 0.00   Types: Cigarettes   Quit date: 2010   Years since quitting: 14.5  Smokeless Tobacco Never  Tobacco Comments   on and off socially     Social History   Substance and Sexual Activity  Alcohol Use Yes   Alcohol/week: 0.0 standard drinks of alcohol   Comment: 2 seltzer beers per day     Family History  Problem Relation Age of Onset   Asthma Mother    Hypertension Mother    Cancer Mother        Type of Skin Cancer   Diabetes Mother    Depression Mother    Heart disease Mother    Varicose Veins Mother    Multiple sclerosis Mother    Cancer Father        Skin   Deep  vein thrombosis Father    Asthma Sister    Hypertension Sister    Depression Sister    Learning disabilities Sister    Varicose Veins Sister    Bronchitis Sister    Asthma Sister    Depression Sister    Bronchitis Sister    Colon cancer Maternal Uncle    Cancer Maternal Grandmother    Cancer Paternal Grandfather        lung   Deafness Daughter    Eczema Nephew    Food Allergy Nephew    Bronchitis Nephew    Eczema Niece    Food Allergy Niece    Bronchitis Niece    Colon cancer Other    Urticaria Neg Hx    Atopy Neg Hx    Angioedema Neg Hx    Esophageal cancer Neg Hx    Stomach cancer Neg Hx    Rectal cancer Neg Hx       Current Outpatient Medications:    albuterol (VENTOLIN HFA) 108 (90 Base) MCG/ACT inhaler, Inhale 2 puffs into the lungs every 4 (four) hours as needed  for wheezing or shortness of breath (coughing fits)., Disp: 18 g, Rfl: 1   amphetamine-dextroamphetamine (ADDERALL XR) 30 MG 24 hr capsule, Take 1 capsule (30 mg total) by mouth every morning., Disp: 30 capsule, Rfl: 0   amphetamine-dextroamphetamine (ADDERALL) 10 MG tablet, Take 10 mg by mouth daily with breakfast., Disp: , Rfl:    EPINEPHrine (AUVI-Q) 0.3 mg/0.3 mL IJ SOAJ injection, Inject 0.3 mg into the muscle as needed for anaphylaxis., Disp: 1 each, Rfl: 1   ibuprofen (ADVIL) 200 MG tablet, Take 200 mg by mouth every 6 (six) hours as needed., Disp: , Rfl:    lidocaine (XYLOCAINE) 2 % solution, Use as directed 10 mLs in the mouth or throat every 3 (three) hours as needed for mouth pain., Disp: 100 mL, Rfl: 0   Magnesium 100 MG CAPS, Take by mouth., Disp: , Rfl:    NON FORMULARY, Beef liver, Disp: , Rfl:    Omega-3 Fatty Acids (OMEGA 3 500 PO), Take by mouth., Disp: , Rfl:    valACYclovir (VALTREX) 1000 MG tablet, Valtrex 2 g twice daily x 1 day(so 2 1000mg ) #4, Disp: 4 tablet, Rfl: 0   Vitamin D, Ergocalciferol, (DRISDOL) 1.25 MG (50000 UNIT) CAPS capsule, Take 1 capsule (50,000 Units total) by mouth  every 7 (seven) days., Disp: 12 capsule, Rfl: 0   Cardiovascular and other pertinent studies:  Reviewed external labs and tests, independently interpreted  EKG 07/06/2023: Sinus tachycardia 109 bpm  Otherwise normal EKG   Recent labs: 06/21/2023: Glucose 96, BUN/Cr 9/0.62. EGFR 114. Na/K 36/4.4. Rest of the CMP normal H/H 11/34. MCV 80. Platelets 441 TSH 2.4 normal    Review of Systems  Cardiovascular:  Positive for chest pain and palpitations. Negative for dyspnea on exertion, leg swelling and syncope.         Vitals:   07/06/23 1459 07/06/23 1504  BP: (!) 155/101 (!) 151/102  Pulse: (!) 121 (!) 116  SpO2: 100% 99%   Orthostatic VS for the past 72 hrs (Last 3 readings):  Orthostatic BP Patient Position BP Location Cuff Size Orthostatic Pulse  07/06/23 1524 (!) 151/105 Standing Left Arm Normal 122  07/06/23 1523 (!) 150/109 Sitting Left Arm Normal 119  07/06/23 1522 (!) 147/96 Supine Left Arm Normal 111     Body mass index is 33.83 kg/m. Filed Weights   07/06/23 1459  Weight: 216 lb (98 kg)     Objective:   Physical Exam Vitals and nursing note reviewed.  Constitutional:      General: She is not in acute distress. Neck:     Vascular: No JVD.  Cardiovascular:     Rate and Rhythm: Normal rate and regular rhythm.     Heart sounds: Normal heart sounds. No murmur heard. Pulmonary:     Effort: Pulmonary effort is normal.     Breath sounds: Normal breath sounds. No wheezing or rales.  Musculoskeletal:     Right lower leg: No edema.     Left lower leg: No edema.          Visit diagnoses:   ICD-10-CM   1. Palpitations  R00.2 EKG 12-Lead    2. Essential hypertension  I10        Orders Placed This Encounter  Procedures   EKG 12-Lead     Medication changes this visit:  Meds ordered this encounter  Medications   metoprolol tartrate (LOPRESSOR) 25 MG tablet    Sig: Take 1 tablet (25 mg total) by mouth 2 (two) times  daily.    Dispense:  60 tablet     Refill:  3     Assessment & Recommendations:    37 y.o. Caucasian female with Iron deficiency anemia, von Willebrand disease type I, palpitations, tachycardia  Symptoms concerning for POTS, although not confirmed on orthostatic vitals today. Encourage liberal hydration and compression stockings. She is currently wearing Zio patch through PCP, results awaited. TSH is normal. Stress is likely contributing. Given her elevated blood pressure and heart rate, recommend starting metoprolol tartrate 25 mg bid. Will reassess symptoms in 4 weeks and review Zio patch.    Thank you for referring the patient to [place]. Please feel free to contact with any questions.   Elder Negus, MD Pager: [phone] Office: [phone]

## 2023-07-07 ENCOUNTER — Encounter: Payer: Self-pay | Admitting: Cardiology

## 2023-07-07 NOTE — Telephone Encounter (Signed)
From pt

## 2023-07-08 NOTE — Telephone Encounter (Signed)
I reviewed the Zio patch report. I do see rapid resting heart rate, no severely low heart rate, no severe arrhythmia., Lets see how you respond to metoprolol.  Thanks MJP

## 2023-07-08 NOTE — Progress Notes (Signed)
Reviewed. There are short episodes of probable atrial tachycardia, but mostly sinus tachycardia, no Afib. I have started her on metoprolol. Lets see how she does.  Thanks MJP

## 2023-07-11 NOTE — Telephone Encounter (Signed)
From patient.

## 2023-07-13 ENCOUNTER — Inpatient Hospital Stay: Payer: Managed Care, Other (non HMO)

## 2023-07-13 VITALS — BP 139/95 | HR 74 | Temp 98.1°F | Resp 18 | Ht 67.0 in | Wt 214.7 lb

## 2023-07-13 DIAGNOSIS — D509 Iron deficiency anemia, unspecified: Secondary | ICD-10-CM | POA: Diagnosis not present

## 2023-07-13 DIAGNOSIS — D500 Iron deficiency anemia secondary to blood loss (chronic): Secondary | ICD-10-CM

## 2023-07-13 MED ORDER — SODIUM CHLORIDE 0.9 % IV SOLN
300.0000 mg | Freq: Once | INTRAVENOUS | Status: AC
  Administered 2023-07-13: 300 mg via INTRAVENOUS
  Filled 2023-07-13: qty 300

## 2023-07-13 MED ORDER — SODIUM CHLORIDE 0.9 % IV SOLN: Freq: Once | INTRAVENOUS | Status: AC

## 2023-07-13 NOTE — Patient Instructions (Signed)
Iron Sucrose Injection What is this medication? IRON SUCROSE (EYE ern SOO krose) treats low levels of iron (iron deficiency anemia) in people with kidney disease. Iron is a mineral that plays an important role in making red blood cells, which carry oxygen from your lungs to the rest of your body. This medicine may be used for other purposes; ask your health care provider or pharmacist if you have questions. COMMON BRAND NAME(S): Venofer What should I tell my care team before I take this medication? They need to know if you have any of these conditions: Anemia not caused by low iron levels Heart disease High levels of iron in the blood Kidney disease Liver disease An unusual or allergic reaction to iron, other medications, foods, dyes, or preservatives Pregnant or trying to get pregnant Breastfeeding How should I use this medication? This medication is for infusion into a vein. It is given in a hospital or clinic setting. Talk to your care team about the use of this medication in children. While this medication may be prescribed for children as young as 2 years for selected conditions, precautions do apply. Overdosage: If you think you have taken too much of this medicine contact a poison control center or emergency room at once. NOTE: This medicine is only for you. Do not share this medicine with others. What if I miss a dose? Keep appointments for follow-up doses. It is important not to miss your dose. Call your care team if you are unable to keep an appointment. What may interact with this medication? Do not take this medication with any of the following: Deferoxamine Dimercaprol Other iron products This medication may also interact with the following: Chloramphenicol Deferasirox This list may not describe all possible interactions. Give your health care provider a list of all the medicines, herbs, non-prescription drugs, or dietary supplements you use. Also tell them if you smoke,  drink alcohol, or use illegal drugs. Some items may interact with your medicine. What should I watch for while using this medication? Visit your care team regularly. Tell your care team if your symptoms do not start to get better or if they get worse. You may need blood work done while you are taking this medication. You may need to follow a special diet. Talk to your care team. Foods that contain iron include: whole grains/cereals, dried fruits, beans, or peas, leafy green vegetables, and organ meats (liver, kidney). What side effects may I notice from receiving this medication? Side effects that you should report to your care team as soon as possible: Allergic reactions--skin rash, itching, hives, swelling of the face, lips, tongue, or throat Low blood pressure--dizziness, feeling faint or lightheaded, blurry vision Shortness of breath Side effects that usually do not require medical attention (report to your care team if they continue or are bothersome): Flushing Headache Joint pain Muscle pain Nausea Pain, redness, or irritation at injection site This list may not describe all possible side effects. Call your doctor for medical advice about side effects. You may report side effects to FDA at 1-800-FDA-1088. Where should I keep my medication? This medication is given in a hospital or clinic. It will not be stored at home. NOTE: This sheet is a summary. It may not cover all possible information. If you have questions about this medicine, talk to your doctor, pharmacist, or health care provider.  2024 Elsevier/Gold Standard (2023-05-13 00:00:00)

## 2023-07-20 ENCOUNTER — Inpatient Hospital Stay: Payer: Managed Care, Other (non HMO)

## 2023-07-20 VITALS — BP 137/93 | HR 96 | Temp 98.1°F | Resp 18

## 2023-07-20 DIAGNOSIS — D500 Iron deficiency anemia secondary to blood loss (chronic): Secondary | ICD-10-CM

## 2023-07-20 DIAGNOSIS — D509 Iron deficiency anemia, unspecified: Secondary | ICD-10-CM | POA: Diagnosis not present

## 2023-07-20 MED ORDER — SODIUM CHLORIDE 0.9 % IV SOLN
300.0000 mg | Freq: Once | INTRAVENOUS | Status: AC
  Administered 2023-07-20: 300 mg via INTRAVENOUS
  Filled 2023-07-20: qty 300

## 2023-07-20 MED ORDER — SODIUM CHLORIDE 0.9 % IV SOLN: Freq: Once | INTRAVENOUS | Status: AC

## 2023-07-20 NOTE — Patient Instructions (Signed)
Iron Sucrose Injection What is this medication? IRON SUCROSE (EYE ern SOO krose) treats low levels of iron (iron deficiency anemia) in people with kidney disease. Iron is a mineral that plays an important role in making red blood cells, which carry oxygen from your lungs to the rest of your body. This medicine may be used for other purposes; ask your health care provider or pharmacist if you have questions. COMMON BRAND NAME(S): Venofer What should I tell my care team before I take this medication? They need to know if you have any of these conditions: Anemia not caused by low iron levels Heart disease High levels of iron in the blood Kidney disease Liver disease An unusual or allergic reaction to iron, other medications, foods, dyes, or preservatives Pregnant or trying to get pregnant Breastfeeding How should I use this medication? This medication is for infusion into a vein. It is given in a hospital or clinic setting. Talk to your care team about the use of this medication in children. While this medication may be prescribed for children as young as 2 years for selected conditions, precautions do apply. Overdosage: If you think you have taken too much of this medicine contact a poison control center or emergency room at once. NOTE: This medicine is only for you. Do not share this medicine with others. What if I miss a dose? Keep appointments for follow-up doses. It is important not to miss your dose. Call your care team if you are unable to keep an appointment. What may interact with this medication? Do not take this medication with any of the following: Deferoxamine Dimercaprol Other iron products This medication may also interact with the following: Chloramphenicol Deferasirox This list may not describe all possible interactions. Give your health care provider a list of all the medicines, herbs, non-prescription drugs, or dietary supplements you use. Also tell them if you smoke,  drink alcohol, or use illegal drugs. Some items may interact with your medicine. What should I watch for while using this medication? Visit your care team regularly. Tell your care team if your symptoms do not start to get better or if they get worse. You may need blood work done while you are taking this medication. You may need to follow a special diet. Talk to your care team. Foods that contain iron include: whole grains/cereals, dried fruits, beans, or peas, leafy green vegetables, and organ meats (liver, kidney). What side effects may I notice from receiving this medication? Side effects that you should report to your care team as soon as possible: Allergic reactions--skin rash, itching, hives, swelling of the face, lips, tongue, or throat Low blood pressure--dizziness, feeling faint or lightheaded, blurry vision Shortness of breath Side effects that usually do not require medical attention (report to your care team if they continue or are bothersome): Flushing Headache Joint pain Muscle pain Nausea Pain, redness, or irritation at injection site This list may not describe all possible side effects. Call your doctor for medical advice about side effects. You may report side effects to FDA at 1-800-FDA-1088. Where should I keep my medication? This medication is given in a hospital or clinic. It will not be stored at home. NOTE: This sheet is a summary. It may not cover all possible information. If you have questions about this medicine, talk to your doctor, pharmacist, or health care provider.  2024 Elsevier/Gold Standard (2023-05-13 00:00:00)

## 2023-08-04 ENCOUNTER — Ambulatory Visit: Payer: Managed Care, Other (non HMO) | Admitting: Cardiology

## 2023-08-15 ENCOUNTER — Inpatient Hospital Stay: Payer: Managed Care, Other (non HMO) | Attending: Nurse Practitioner

## 2023-08-15 ENCOUNTER — Inpatient Hospital Stay: Payer: Managed Care, Other (non HMO) | Admitting: Nurse Practitioner

## 2023-08-18 ENCOUNTER — Ambulatory Visit
Admission: EM | Admit: 2023-08-18 | Discharge: 2023-08-18 | Disposition: A | Discharge status: Home / Self Care | Payer: Managed Care, Other (non HMO) | Attending: Family Medicine | Admitting: Family Medicine

## 2023-08-18 ENCOUNTER — Ambulatory Visit: Payer: Self-pay

## 2023-08-18 DIAGNOSIS — J4521 Mild intermittent asthma with (acute) exacerbation: Secondary | ICD-10-CM | POA: Diagnosis present

## 2023-08-18 DIAGNOSIS — J069 Acute upper respiratory infection, unspecified: Secondary | ICD-10-CM | POA: Diagnosis not present

## 2023-08-18 DIAGNOSIS — Z1152 Encounter for screening for COVID-19: Secondary | ICD-10-CM | POA: Diagnosis not present

## 2023-08-18 DIAGNOSIS — R059 Cough, unspecified: Secondary | ICD-10-CM | POA: Diagnosis present

## 2023-08-18 LAB — POCT INFLUENZA A/B
Influenza A, POC: NEGATIVE
Influenza B, POC: NEGATIVE

## 2023-08-18 MED ORDER — DEXAMETHASONE SODIUM PHOSPHATE 10 MG/ML IJ SOLN
10.0000 mg | Freq: Once | INTRAMUSCULAR | Status: AC
  Administered 2023-08-18: 10 mg via INTRAMUSCULAR

## 2023-08-18 NOTE — ED Triage Notes (Signed)
Pt c/o headache, body aches, cough, chest congestion, heaviness, sore throat ("feels like breathing through a straw"), nausea vomiting diarrhea and general discomfort started over night

## 2023-08-18 NOTE — ED Provider Notes (Signed)
[facility]    CSN: [id number] Arrival date & time: 08/18/23  [id number]      History   Chief Complaint No chief complaint on file.   HPI Kendra Patton is a 37 y.o. female.   Patient presenting today with 1 day history of headache, productive cough, chest congestion, body aches, fatigue, sore throat, nasal congestion.  Is also had some nausea vomiting and diarrhea.  Denies chest pain, shortness of breath, known fevers.  So far trying over-the-counter remedies with minimal relief.  No known sick contacts recently.  History of asthma on albuterol as needed, states this is only providing small amounts of relief for her at this point.    Past Medical History:  Diagnosis Date   Anxiety    age 15   Asthma    COVID 04/2021   Depression    Eczema    High-risk pregnancy supervision 10/27/2015   History of CMV    last pregnancy   History of OCD (obsessive compulsive disorder)    IBS (irritable bowel syndrome)    Scoliosis    Von Willebrand disease (HCC)     Patient Active Problem List   Diagnosis Date Noted   Palpitations 07/06/2023   Asthma 06/21/2023   History of varicella vaccination 06/21/2023   Change in bowel habit 09/22/2022   Flatulence, eructation and gas pain 09/22/2022   History of blood disorder 09/22/2022   Left lower quadrant pain 09/22/2022   Generalized pain 09/10/2022   Chronic fatigue 09/10/2022   Flecks of blood in stool 09/10/2022   Iron deficiency anemia 09/10/2022   Not well controlled moderate persistent asthma 03/11/2022   Other adverse food reactions, not elsewhere classified, subsequent encounter 05/26/2021   Oral allergy syndrome, subsequent encounter 05/26/2021   Dyshidrotic eczema 05/26/2021   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   Polycystic ovary syndrome 12/24/2019   Vaginal delivery 02/29/2016   Anxiety - dx'd at age 4 02/28/2016   Eczema 02/28/2016   IBS (irritable bowel syndrome) 02/28/2016    Scoliosis 02/28/2016   Seasonal and perennial allergic rhinoconjunctivitis 02/28/2016   Allergy history, drug -- Compazine (dystonic rxn; seizures) 02/27/2016   High-risk pregnancy supervision 10/27/2015   Von Willebrand disease, type I (HCC) 07/11/2015   Aspirin allergy 07/11/2015    Past Surgical History:  Procedure Laterality Date   COLONOSCOPY     UPPER GASTROINTESTINAL ENDOSCOPY     WISDOM TOOTH EXTRACTION      OB History     Gravida  2   Para  2   Term  2   Preterm      AB      Living  2      SAB      IAB      Ectopic      Multiple  0   Live Births  2            Home Medications    Prior to Admission medications   Medication Sig Start Date End Date Taking? Authorizing Provider  albuterol (VENTOLIN HFA) 108 (90 Base) MCG/ACT inhaler Inhale 2 puffs into the lungs every 4 (four) hours as needed for wheezing or shortness of breath (coughing fits). 03/11/22   Ellamae Sia, DO  amphetamine-dextroamphetamine (ADDERALL XR) 30 MG 24 hr capsule Take 1 capsule (30 mg total) by mouth every morning. 06/01/21   Worthy Rancher B, FNP  amphetamine-dextroamphetamine (ADDERALL) 10 MG tablet Take 10 mg by mouth daily with breakfast. 05/27/23  [provider]  EPINEPHrine (AUVI-Q) 0.3 mg/0.3 mL IJ SOAJ injection Inject 0.3 mg into the muscle as needed for anaphylaxis. 05/26/21   Ellamae Sia, DO  ibuprofen (ADVIL) 200 MG tablet Take 200 mg by mouth every 6 (six) hours as needed.    [provider]  lidocaine (XYLOCAINE) 2 % solution Use as directed 10 mLs in the mouth or throat every 3 (three) hours as needed for mouth pain. 06/19/23   Particia Nearing, PA-C  Magnesium 100 MG CAPS Take by mouth.    [provider]  metoprolol tartrate (LOPRESSOR) 25 MG tablet Take 1 tablet (25 mg total) by mouth 2 (two) times daily. 07/06/23 10/04/23  Patwardhan, Anabel Bene, MD  NON FORMULARY Beef liver    [provider]  Omega-3 Fatty Acids (OMEGA 3 500  PO) Take by mouth.    [provider]  valACYclovir (VALTREX) 1000 MG tablet Valtrex 2 g twice daily x 1 day(so 2 1000mg ) #4 05/23/23   Jeani Sow, MD  Vitamin D, Ergocalciferol, (DRISDOL) 1.25 MG (50000 UNIT) CAPS capsule Take 1 capsule (50,000 Units total) by mouth every 7 (seven) days. 06/22/23   Allwardt, Crist Infante, PA-C    Family History Family History  Problem Relation Age of Onset   Asthma Mother    Hypertension Mother    Cancer Mother        Type of Skin Cancer   Diabetes Mother    Depression Mother    Heart disease Mother    Varicose Veins Mother    Multiple sclerosis Mother    Hypertension Father    Cancer Father        Skin   Deep vein thrombosis Father    CAD Father    Asthma Sister    Hypertension Sister    Depression Sister    Learning disabilities Sister    Varicose Veins Sister    Bronchitis Sister    Asthma Sister    Depression Sister    Bronchitis Sister    Colon cancer Maternal Uncle    Cancer Maternal Grandmother    Cancer Paternal Grandfather        lung   Deafness Daughter    Eczema Nephew    Food Allergy Nephew    Bronchitis Nephew    Eczema Niece    Food Allergy Niece    Bronchitis Niece    Colon cancer Other    Urticaria Neg Hx    Atopy Neg Hx    Angioedema Neg Hx    Esophageal cancer Neg Hx    Stomach cancer Neg Hx    Rectal cancer Neg Hx     Social History Social History   Tobacco Use   Smoking status: Former    Current packs/day: 0.00    Types: Cigarettes    Quit date: 2010    Years since quitting: 14.6   Smokeless tobacco: Never   Tobacco comments:    on and off socially   Vaping Use   Vaping status: Every Day   Substances: Nicotine  Substance Use Topics   Alcohol use: Yes    Alcohol/week: 0.0 standard drinks of alcohol    Comment: 2 seltzer beers per day   Drug use: No     Allergies   Aspirin, Compazine [prochlorperazine edisylate], Other, Penicillins, and Prochlorperazine   Review of Systems Review  of Systems Per HPI  Physical Exam Triage Vital Signs ED Triage Vitals  Encounter Vitals Group  BP 08/18/23 1015 (!) 139/96     Systolic BP Percentile --      Diastolic BP Percentile --      Pulse Rate 08/18/23 1015 85     Resp 08/18/23 1015 13     Temp 08/18/23 1015 98.7 F (37.1 C)     Temp Source 08/18/23 1015 Oral     SpO2 08/18/23 1015 98 %     Weight --      Height --      Head Circumference --      Peak Flow --      Pain Score 08/18/23 1016 6     Pain Loc --      Pain Education --      Exclude from Growth Chart --    No data found.  Updated Vital Signs BP (!) 139/96 (BP Location: Right Arm)   Pulse 85   Temp 98.7 F (37.1 C) (Oral)   Resp 13   LMP 07/28/2023 (Within Weeks)   SpO2 98%   Visual Acuity Right Eye Distance:   Left Eye Distance:   Bilateral Distance:    Right Eye Near:   Left Eye Near:    Bilateral Near:     Physical Exam Vitals and nursing note reviewed.  Constitutional:      Appearance: Normal appearance.  HENT:     Head: Atraumatic.     Right Ear: Tympanic membrane and external ear normal.     Left Ear: Tympanic membrane and external ear normal.     Nose: Rhinorrhea present.     Mouth/Throat:     Mouth: Mucous membranes are moist.     Pharynx: Posterior oropharyngeal erythema present.  Eyes:     Extraocular Movements: Extraocular movements intact.     Conjunctiva/sclera: Conjunctivae normal.  Cardiovascular:     Rate and Rhythm: Normal rate and regular rhythm.     Heart sounds: Normal heart sounds.  Pulmonary:     Effort: Pulmonary effort is normal.     Breath sounds: Wheezing present.  Musculoskeletal:        General: Normal range of motion.     Cervical back: Normal range of motion and neck supple.  Skin:    General: Skin is warm and dry.  Neurological:     Mental Status: She is alert and oriented to person, place, and time.  Psychiatric:        Mood and Affect: Mood normal.        Thought Content: Thought content  normal.      UC Treatments / Results  Labs (all labs ordered are listed, but only abnormal results are displayed) Labs Reviewed  SARS CORONAVIRUS 2 (TAT 6-24 HRS)  POCT INFLUENZA A/B    EKG   Radiology No results found.  Procedures Procedures (including critical care time)  Medications Ordered in UC Medications  dexamethasone (DECADRON) injection 10 mg (10 mg Intramuscular Given 08/18/23 1108)    Initial Impression / Assessment and Plan / UC Course  I have reviewed the triage vital signs and the nursing notes.  Pertinent labs & imaging results that were available during my care of the patient were reviewed by me and considered in my medical decision making (see chart for details).     Vitals and exam overall reassuring and suggestive of a viral respiratory infection causing asthma exacerbation.  Treat with IM Decadron, COVID test pending, rapid flu is negative.  Good candidate for Paxlovid if positive.  Discussed supportive over-the-counter  medications and home care additionally. Final Clinical Impressions(s) / UC Diagnoses   Final diagnoses:  Viral URI with cough  Mild intermittent asthma with acute exacerbation   Discharge Instructions   None    ED Prescriptions   None    PDMP not reviewed this encounter.   Particia Nearing, [place] 08/18/23 1430

## 2023-08-19 LAB — SARS CORONAVIRUS 2 (TAT 6-24 HRS): SARS Coronavirus 2: NEGATIVE

## 2023-11-02 ENCOUNTER — Encounter: Payer: Self-pay | Admitting: Nurse Practitioner

## 2023-11-02 ENCOUNTER — Encounter: Payer: Self-pay | Admitting: Physician Assistant

## 2023-11-02 ENCOUNTER — Ambulatory Visit: Payer: Managed Care, Other (non HMO) | Admitting: Physician Assistant

## 2023-11-02 VITALS — BP 135/90 | HR 100 | Temp 97.8°F | Ht 67.0 in | Wt 208.2 lb

## 2023-11-02 DIAGNOSIS — R5383 Other fatigue: Secondary | ICD-10-CM

## 2023-11-02 DIAGNOSIS — D509 Iron deficiency anemia, unspecified: Secondary | ICD-10-CM | POA: Diagnosis not present

## 2023-11-02 DIAGNOSIS — G4483 Primary cough headache: Secondary | ICD-10-CM | POA: Diagnosis not present

## 2023-11-02 DIAGNOSIS — J020 Streptococcal pharyngitis: Secondary | ICD-10-CM

## 2023-11-02 DIAGNOSIS — R599 Enlarged lymph nodes, unspecified: Secondary | ICD-10-CM

## 2023-11-02 DIAGNOSIS — R0683 Snoring: Secondary | ICD-10-CM

## 2023-11-02 LAB — POCT RAPID STREP A (OFFICE): Rapid Strep A Screen: POSITIVE — AB

## 2023-11-02 LAB — POCT INFLUENZA A/B
Influenza A, POC: NEGATIVE
Influenza B, POC: NEGATIVE

## 2023-11-02 LAB — POC COVID19 BINAXNOW: SARS Coronavirus 2 Ag: NEGATIVE

## 2023-11-02 MED ORDER — AZITHROMYCIN 250 MG PO TABS
ORAL_TABLET | ORAL | 0 refills | Status: AC
Start: 2023-11-02 — End: 2023-11-07

## 2023-11-02 MED ORDER — CEFDINIR 300 MG PO CAPS
300.0000 mg | ORAL_CAPSULE | Freq: Two times a day (BID) | ORAL | 0 refills | Status: AC
Start: 2023-11-02 — End: 2023-11-12

## 2023-11-02 NOTE — Progress Notes (Unsigned)
Patient ID: JONNITA THALACKER, female    DOB: Sep 12, 1986, 37 y.o.   MRN: [id number]   Assessment & Plan:  Cough headache -     POCT rapid strep A -     POCT Influenza A/B -     POC COVID-19 BinaxNow -     Cefdinir; Take 1 capsule (300 mg total) by mouth 2 (two) times daily for 10 days.  Dispense: 20 capsule; Refill: 0 -     Azithromycin; Take 2 tablets on day 1, then 1 tablet daily on days 2 through 5  Dispense: 6 tablet; Refill: 0  Other fatigue -     Cefdinir; Take 1 capsule (300 mg total) by mouth 2 (two) times daily for 10 days.  Dispense: 20 capsule; Refill: 0 -     Azithromycin; Take 2 tablets on day 1, then 1 tablet daily on days 2 through 5  Dispense: 6 tablet; Refill: 0  Swollen lymph nodes -     POCT rapid strep A -     Cefdinir; Take 1 capsule (300 mg total) by mouth 2 (two) times daily for 10 days.  Dispense: 20 capsule; Refill: 0 -     Azithromycin; Take 2 tablets on day 1, then 1 tablet daily on days 2 through 5  Dispense: 6 tablet; Refill: 0  Strep pharyngitis -     Cefdinir; Take 1 capsule (300 mg total) by mouth 2 (two) times daily for 10 days.  Dispense: 20 capsule; Refill: 0 -     Azithromycin; Take 2 tablets on day 1, then 1 tablet daily on days 2 through 5  Dispense: 6 tablet; Refill: 0  Loud snoring -     Ambulatory referral to Sleep Studies  Iron deficiency anemia, unspecified iron deficiency anemia type -     IBC + Ferritin -     CBC with Differential/Platelet    Assessment and Plan    Strep Throat Positive strep test with sore throat and systemic symptoms. No penicillin due to allergy. -Start Cefuroxime for 10 days. -Consider adding Azithromycin if not improving.  Upper Respiratory Infection History of bronchitis-like symptoms with productive cough and nasal congestion. Green/yellow sputum with blood. -Continue Cefuroxime and consider adding Azithromycin if not improving. -Advise symptomatic relief with Tylenol, ibuprofen, throat gargles, rest, and  Mucinex.  Iron Deficiency History of iron deficiency with recent fatigue. -Check CBC and ferritin today.  Sleep Apnea Reports of snoring and interrupted sleep. -Order sleep study.          Subjective:    Chief Complaint  Patient presents with   Cough    Chest congestion, feeling tired since 10/18/2023 symptoms worsening    HPI Discussed the use of AI scribe software for clinical note transcription with the patient, who gave verbal consent to proceed.  History of Present Illness   The patient, with a history of iron deficiency, presents with a two-week history of headache, chest congestion, and a sore throat. The patient tested positive for strep and negative for COVID and flu. The patient reports that the symptoms started with a severe headache and later developed into what she believes was bronchitis, with chest rattling but no swollen lymph nodes. The patient woke up with a sore throat and has been feeling generally unwell. The patient has been coughing up thick, yellow-green, and bloody sputum, which has worsened over the past day. The patient also reports thick, green nasal congestion. The patient has experienced shaking episodes during the  night, which she believes are related to her immune response when she gets sick. The patient denies fever and reports no known allergies except for penicillin.       Past Medical History:  Diagnosis Date   Anxiety    age 71   Asthma    COVID 04/2021   Depression    Eczema    High-risk pregnancy supervision 10/27/2015   History of CMV    last pregnancy   History of OCD (obsessive compulsive disorder)    IBS (irritable bowel syndrome)    Scoliosis    Von Willebrand disease (HCC)     Past Surgical History:  Procedure Laterality Date   COLONOSCOPY     UPPER GASTROINTESTINAL ENDOSCOPY     WISDOM TOOTH EXTRACTION      Family History  Problem Relation Age of Onset   Asthma Mother    Hypertension Mother    Cancer Mother         Type of Skin Cancer   Diabetes Mother    Depression Mother    Heart disease Mother    Varicose Veins Mother    Multiple sclerosis Mother    Hypertension Father    Cancer Father        Skin   Deep vein thrombosis Father    CAD Father    Asthma Sister    Hypertension Sister    Depression Sister    Learning disabilities Sister    Varicose Veins Sister    Bronchitis Sister    Asthma Sister    Depression Sister    Bronchitis Sister    Colon cancer Maternal Uncle    Cancer Maternal Grandmother    Cancer Paternal Grandfather        lung   Deafness Daughter    Eczema Nephew    Food Allergy Nephew    Bronchitis Nephew    Eczema Niece    Food Allergy Niece    Bronchitis Niece    Colon cancer Other    Urticaria Neg Hx    Atopy Neg Hx    Angioedema Neg Hx    Esophageal cancer Neg Hx    Stomach cancer Neg Hx    Rectal cancer Neg Hx     Social History   Tobacco Use   Smoking status: Former    Current packs/day: 0.00    Types: Cigarettes    Quit date: 2010    Years since quitting: 14.8   Smokeless tobacco: Never   Tobacco comments:    on and off socially   Vaping Use   Vaping status: Every Day   Substances: Nicotine  Substance Use Topics   Alcohol use: Yes    Alcohol/week: 0.0 standard drinks of alcohol    Comment: 2 seltzer beers per day   Drug use: No     Allergies  Allergen Reactions   Aspirin Other (See Comments)    "Thins bloods too much"   Compazine [Prochlorperazine Edisylate]     Dystonic.   Other     Compazine   Penicillins     Unknown reaction   Prochlorperazine     Other reaction(s): Unknown    Review of Systems NEGATIVE UNLESS OTHERWISE INDICATED IN HPI      Objective:     BP (!) 135/90   Pulse 100   Temp 97.8 F (36.6 C) (Temporal)   Ht 5\' 7"  (1.702 m)   Wt 208 lb 3.2 oz (94.4 kg)   LMP 10/06/2023  SpO2 98%   BMI 32.61 kg/m   Wt Readings from Last 3 Encounters:  11/02/23 208 lb 3.2 oz (94.4 kg)  07/13/23 214 lb 11.2 oz  (97.4 kg)  07/06/23 216 lb (98 kg)    BP Readings from Last 3 Encounters:  11/02/23 (!) 135/90  08/18/23 (!) 139/96  07/20/23 (!) 137/93     Physical Exam Vitals and nursing note reviewed.  Constitutional:      General: She is not in acute distress.    Appearance: Normal appearance. She is obese. She is not ill-appearing.  HENT:     Head: Normocephalic.     Right Ear: Tympanic membrane, ear canal and external ear normal.     Left Ear: Tympanic membrane, ear canal and external ear normal.     Nose: Congestion present.     Mouth/Throat:     Mouth: Mucous membranes are moist.     Pharynx: Posterior oropharyngeal erythema present. No oropharyngeal exudate.  Eyes:     Extraocular Movements: Extraocular movements intact.     Conjunctiva/sclera: Conjunctivae normal.     Pupils: Pupils are equal, round, and reactive to light.  Cardiovascular:     Rate and Rhythm: Normal rate and regular rhythm.     Pulses: Normal pulses.     Heart sounds: Normal heart sounds. No murmur heard. Pulmonary:     Effort: Pulmonary effort is normal. No respiratory distress.     Breath sounds: Normal breath sounds. No wheezing.  Musculoskeletal:     Cervical back: Normal range of motion.  Skin:    General: Skin is warm.  Neurological:     Mental Status: She is alert and oriented to person, place, and time.  Psychiatric:        Mood and Affect: Mood normal.        Behavior: Behavior normal.       Tyrie Porzio M Luis Sami, PA-C

## 2023-11-03 LAB — CBC WITH DIFFERENTIAL/PLATELET
Basophils Absolute: 0.1 10*3/uL (ref 0.0–0.1)
Basophils Relative: 0.8 % (ref 0.0–3.0)
Eosinophils Absolute: 0.2 10*3/uL (ref 0.0–0.7)
Eosinophils Relative: 3 % (ref 0.0–5.0)
HCT: 40.1 % (ref 36.0–46.0)
Hemoglobin: 12.9 g/dL (ref 12.0–15.0)
Lymphocytes Relative: 34.2 % (ref 12.0–46.0)
Lymphs Abs: 2.6 10*3/uL (ref 0.7–4.0)
MCHC: 32.1 g/dL (ref 30.0–36.0)
MCV: 85.2 fL (ref 78.0–100.0)
Monocytes Absolute: 0.5 10*3/uL (ref 0.1–1.0)
Monocytes Relative: 6.3 % (ref 3.0–12.0)
Neutro Abs: 4.2 10*3/uL (ref 1.4–7.7)
Neutrophils Relative %: 55.7 % (ref 43.0–77.0)
Platelets: 400 10*3/uL (ref 150.0–400.0)
RBC: 4.71 Mil/uL (ref 3.87–5.11)
RDW: 14.7 % (ref 11.5–15.5)
WBC: 7.5 10*3/uL (ref 4.0–10.5)

## 2023-11-03 LAB — IBC + FERRITIN
Ferritin: 5 ng/mL — ABNORMAL LOW (ref 10.0–291.0)
Iron: 61 ug/dL (ref 42–145)
Saturation Ratios: 11.3 % — ABNORMAL LOW (ref 20.0–50.0)
TIBC: 540.4 ug/dL — ABNORMAL HIGH (ref 250.0–450.0)
Transferrin: 386 mg/dL — ABNORMAL HIGH (ref 212.0–360.0)

## 2023-11-03 NOTE — Patient Instructions (Signed)
VISIT SUMMARY:  You came in today with a two-week history of severe headache, chest congestion, and a sore throat. You tested positive for strep throat and have symptoms that suggest an upper respiratory infection. We also discussed your history of iron deficiency and possible sleep apnea.  YOUR PLAN:  -STREP THROAT: Strep throat is a bacterial infection that causes a sore, scratchy throat. Since you are allergic to penicillin, we will start you on Cefuroxime for 10 days. If your symptoms do not improve, we may add Azithromycin.  -UPPER RESPIRATORY INFECTION: An upper respiratory infection affects the nose, throat, and airways. You have symptoms like a productive cough and nasal congestion. Continue taking Cefuroxime and consider adding Azithromycin if you do not see improvement. For relief, use Tylenol, ibuprofen, throat gargles, rest, and Mucinex.  -IRON DEFICIENCY: Iron deficiency means you have low levels of iron in your blood, which can cause fatigue. We will check your complete blood count (CBC) and ferritin levels today.  -SLEEP APNEA: Sleep apnea is a condition where your breathing stops and starts during sleep, often causing snoring and interrupted sleep. We will order a sleep study to investigate this further.  INSTRUCTIONS:  Please follow up with [place] after completing the 10-day course of Cefuroxime or sooner if your symptoms worsen or do not improve. We will also contact you with the results of your blood tests and sleep study.

## 2023-11-09 ENCOUNTER — Encounter: Payer: Self-pay | Admitting: Nurse Practitioner

## 2023-11-11 ENCOUNTER — Other Ambulatory Visit (HOSPITAL_BASED_OUTPATIENT_CLINIC_OR_DEPARTMENT_OTHER): Payer: Self-pay

## 2023-11-11 MED ORDER — AMPHETAMINE-DEXTROAMPHETAMINE 10 MG PO TABS
10.0000 mg | ORAL_TABLET | Freq: Every evening | ORAL | 0 refills | Status: DC
  Filled 2023-12-12: qty 30, 30d supply, fill #0

## 2023-11-11 MED ORDER — AMPHETAMINE-DEXTROAMPHETAMINE 10 MG PO TABS: 10.0000 mg | ORAL_TABLET | Freq: Every evening | ORAL | 0 refills | Status: DC

## 2023-11-11 MED ORDER — AMPHETAMINE-DEXTROAMPHET ER 30 MG PO CP24
30.0000 mg | ORAL_CAPSULE | Freq: Every morning | ORAL | 0 refills | Status: DC
  Filled 2024-01-28 (×2): qty 30, 30d supply, fill #0

## 2023-11-11 MED ORDER — AMPHETAMINE-DEXTROAMPHETAMINE 10 MG PO TABS
10.0000 mg | ORAL_TABLET | Freq: Every evening | ORAL | 0 refills | Status: DC
  Filled 2024-01-16: qty 30, 30d supply, fill #0

## 2023-11-11 MED ORDER — AMPHETAMINE-DEXTROAMPHET ER 30 MG PO CP24
30.0000 mg | ORAL_CAPSULE | Freq: Every morning | ORAL | 0 refills | Status: DC
  Filled 2023-11-29: qty 30, 30d supply, fill #0

## 2023-11-11 MED ORDER — AMPHETAMINE-DEXTROAMPHET ER 30 MG PO CP24
30.0000 mg | ORAL_CAPSULE | Freq: Every morning | ORAL | 0 refills | Status: DC
  Filled 2023-12-29: qty 30, 30d supply, fill #0

## 2023-11-29 ENCOUNTER — Encounter: Payer: Self-pay | Admitting: Nurse Practitioner

## 2023-11-29 ENCOUNTER — Other Ambulatory Visit (HOSPITAL_BASED_OUTPATIENT_CLINIC_OR_DEPARTMENT_OTHER): Payer: Self-pay

## 2023-12-12 ENCOUNTER — Other Ambulatory Visit (HOSPITAL_BASED_OUTPATIENT_CLINIC_OR_DEPARTMENT_OTHER): Payer: Self-pay

## 2023-12-29 ENCOUNTER — Other Ambulatory Visit (HOSPITAL_COMMUNITY): Payer: Self-pay

## 2023-12-29 ENCOUNTER — Other Ambulatory Visit (HOSPITAL_BASED_OUTPATIENT_CLINIC_OR_DEPARTMENT_OTHER): Payer: Self-pay

## 2024-01-11 ENCOUNTER — Other Ambulatory Visit: Payer: Self-pay | Admitting: Cardiology

## 2024-01-16 ENCOUNTER — Other Ambulatory Visit (HOSPITAL_BASED_OUTPATIENT_CLINIC_OR_DEPARTMENT_OTHER): Payer: Self-pay

## 2024-01-28 ENCOUNTER — Other Ambulatory Visit (HOSPITAL_BASED_OUTPATIENT_CLINIC_OR_DEPARTMENT_OTHER): Payer: Self-pay

## 2024-01-31 ENCOUNTER — Encounter: Payer: Self-pay | Admitting: Nurse Practitioner

## 2024-01-31 ENCOUNTER — Other Ambulatory Visit (HOSPITAL_BASED_OUTPATIENT_CLINIC_OR_DEPARTMENT_OTHER): Payer: Self-pay

## 2024-01-31 MED ORDER — HYDROCODONE BIT-HOMATROP MBR 5-1.5 MG/5ML PO SOLN
5.0000 mL | Freq: Four times a day (QID) | ORAL | 0 refills | Status: DC | PRN
  Filled 2024-01-31: qty 120, 6d supply, fill #0

## 2024-01-31 MED ORDER — AZITHROMYCIN 250 MG PO TABS
ORAL_TABLET | ORAL | 0 refills | Status: AC
  Filled 2024-01-31: qty 6, 5d supply, fill #0

## 2024-01-31 MED ORDER — ALBUTEROL SULFATE HFA 108 (90 BASE) MCG/ACT IN AERS
2.0000 | INHALATION_SPRAY | Freq: Four times a day (QID) | RESPIRATORY_TRACT | 0 refills | Status: DC
Start: 2024-01-31 — End: 2024-03-07
  Filled 2024-01-31: qty 18, 17d supply, fill #0

## 2024-02-01 ENCOUNTER — Other Ambulatory Visit (HOSPITAL_BASED_OUTPATIENT_CLINIC_OR_DEPARTMENT_OTHER): Payer: Self-pay

## 2024-02-04 ENCOUNTER — Other Ambulatory Visit (HOSPITAL_BASED_OUTPATIENT_CLINIC_OR_DEPARTMENT_OTHER): Payer: Self-pay

## 2024-02-04 ENCOUNTER — Other Ambulatory Visit (HOSPITAL_COMMUNITY): Payer: Self-pay

## 2024-02-04 MED ORDER — AZELASTINE HCL 0.1 % NA SOLN
2.0000 | Freq: Two times a day (BID) | NASAL | 3 refills | Status: DC
  Filled 2024-02-04: qty 30, 30d supply, fill #0

## 2024-02-04 MED ORDER — LEVOFLOXACIN 500 MG PO TABS
500.0000 mg | ORAL_TABLET | Freq: Every day | ORAL | 0 refills | Status: DC
  Filled 2024-02-04: qty 10, 10d supply, fill #0

## 2024-02-06 ENCOUNTER — Emergency Department (HOSPITAL_BASED_OUTPATIENT_CLINIC_OR_DEPARTMENT_OTHER)
Admission: EM | Admit: 2024-02-06 | Discharge: 2024-02-06 | Disposition: A | Discharge status: Home / Self Care | Payer: 59 | Attending: Emergency Medicine | Admitting: Emergency Medicine

## 2024-02-06 ENCOUNTER — Other Ambulatory Visit: Payer: Self-pay

## 2024-02-06 ENCOUNTER — Other Ambulatory Visit (HOSPITAL_BASED_OUTPATIENT_CLINIC_OR_DEPARTMENT_OTHER): Payer: Self-pay

## 2024-02-06 ENCOUNTER — Encounter: Payer: Self-pay | Admitting: Nurse Practitioner

## 2024-02-06 ENCOUNTER — Encounter (HOSPITAL_BASED_OUTPATIENT_CLINIC_OR_DEPARTMENT_OTHER): Payer: Self-pay | Admitting: *Deleted

## 2024-02-06 ENCOUNTER — Encounter: Payer: Self-pay | Admitting: Physician Assistant

## 2024-02-06 DIAGNOSIS — L299 Pruritus, unspecified: Secondary | ICD-10-CM | POA: Diagnosis present

## 2024-02-06 DIAGNOSIS — J329 Chronic sinusitis, unspecified: Secondary | ICD-10-CM | POA: Insufficient documentation

## 2024-02-06 DIAGNOSIS — T7840XA Allergy, unspecified, initial encounter: Secondary | ICD-10-CM

## 2024-02-06 DIAGNOSIS — B9689 Other specified bacterial agents as the cause of diseases classified elsewhere: Secondary | ICD-10-CM | POA: Diagnosis not present

## 2024-02-06 DIAGNOSIS — T368X5A Adverse effect of other systemic antibiotics, initial encounter: Secondary | ICD-10-CM | POA: Diagnosis not present

## 2024-02-06 MED ORDER — DIPHENHYDRAMINE HCL 25 MG PO CAPS
50.0000 mg | ORAL_CAPSULE | Freq: Once | ORAL | Status: AC
  Administered 2024-02-06: 50 mg via ORAL
  Filled 2024-02-06: qty 2

## 2024-02-06 MED ORDER — DOXYCYCLINE HYCLATE 100 MG PO TABS
100.0000 mg | ORAL_TABLET | Freq: Once | ORAL | Status: AC
  Administered 2024-02-06: 100 mg via ORAL
  Filled 2024-02-06: qty 1

## 2024-02-06 MED ORDER — DOXYCYCLINE HYCLATE 100 MG PO CAPS
100.0000 mg | ORAL_CAPSULE | Freq: Two times a day (BID) | ORAL | 0 refills | Status: AC
Start: 2024-02-06 — End: 2024-02-14
  Filled 2024-02-06: qty 14, 7d supply, fill #0

## 2024-02-06 NOTE — ED Triage Notes (Signed)
Pt states that she was placed on zpack last week and codeine and then Saturday she was placed on levaquin.  She is having increased itching now.  RT listened to pt in triage and lungs clear.

## 2024-02-06 NOTE — Discharge Instructions (Signed)
You were seen in the emergency department today with concerns of itching.  It does appear that you likely had a bit of a sensitivity reaction to the Levaquin.  For this reason, have started you on doxycycline as this will cover bacterial sinus infections as well as pneumonia including mycoplasma pneumonia.  Please take this medication as prescribed for the next 7 days.  For any new or worsening symptoms, return the emergency department.  Otherwise please follow-up with your primary care provider.  If you develop any sort of allergic reaction with this antibiotic, please take either Benadryl, Zyrtec, Claritin, or Pepcid at home to help alleviate some of your symptoms.  If you begin to experience wheezing, difficulty breathing, or swelling of your tongue, call 911 or return to the emergency department immediately.

## 2024-02-07 ENCOUNTER — Other Ambulatory Visit (HOSPITAL_BASED_OUTPATIENT_CLINIC_OR_DEPARTMENT_OTHER): Payer: Self-pay

## 2024-02-07 NOTE — Telephone Encounter (Signed)
Please see pt msg and advise if anything further is needed/recommended

## 2024-02-08 ENCOUNTER — Other Ambulatory Visit: Payer: Self-pay

## 2024-02-08 NOTE — ED Provider Notes (Signed)
 [place] EMERGENCY DEPARTMENT AT [facility] Provider Note   CSN: [id number] Arrival date & time: 02/06/24  1712     History Chief Complaint  Patient presents with   Pruritis    Kendra Patton is a 38 y.o. female. Patient presents to the ED with concerns of pruritus. She states that she was started on Azithromycin and codeine last a sinus infection as well as chest congestion that was not resolving. She did not have improvement and was subsequently started on Levaquin. Has not previously taken Levaquin and states that after a few days, she began to experience severe itching and some red patches of skin on her body. Has stopped Levaquin but still struggling to get the symptoms to fully resolve. Tried Benadryl at home with some improvement. No feelings of difficulty swallowing, breathing, wheezing, or lightheadedness.    HPI     Home Medications Prior to Admission medications   Medication Sig Start Date End Date Taking? Authorizing Provider  doxycycline (VIBRAMYCIN) 100 MG capsule Take 1 capsule (100 mg total) by mouth 2 (two) times daily for 7 days. 02/06/24 02/14/24 Yes Smitty Knudsen, PA-C  albuterol (VENTOLIN HFA) 108 (90 Base) MCG/ACT inhaler Inhale 2 puffs into the lungs every 4 (four) hours as needed for wheezing or shortness of breath (coughing fits). 03/11/22   Ellamae Sia, DO  albuterol (VENTOLIN HFA) 108 (90 Base) MCG/ACT inhaler Inhale 2 puffs into the lungs 4 (four) times daily for wheezing. 01/31/24     amphetamine-dextroamphetamine (ADDERALL XR) 30 MG 24 hr capsule Take 1 capsule (30 mg total) by mouth every morning. 06/01/21   Eulis Foster, FNP  amphetamine-dextroamphetamine (ADDERALL XR) 30 MG 24 hr capsule Take 1 capsule by mouth every morning for focus and attention (12-18-23) 12/18/23     amphetamine-dextroamphetamine (ADDERALL XR) 30 MG 24 hr capsule Take 1 capsule (30mg ) by mouth every morning for focus and attention. 01/18/24      amphetamine-dextroamphetamine (ADDERALL XR) 30 MG 24 hr capsule Take 1 capsule by mouth every morning for focus and attention (11-18-23) 11/18/23     amphetamine-dextroamphetamine (ADDERALL) 10 MG tablet Take 10 mg by mouth daily with breakfast. 05/27/23   [provider]  amphetamine-dextroamphetamine (ADDERALL) 10 MG tablet Take 1 tablet by mouth every afternoon for focus and attention (01-18-24) 01/18/24     amphetamine-dextroamphetamine (ADDERALL) 10 MG tablet Take 1 tablet by mouth in the afternoon as needed for focus and attention (11-18-23) 11/18/23     amphetamine-dextroamphetamine (ADDERALL) 10 MG tablet Take 1 tablet by mouth every afternoon for focus and attention (12-18-23) 12/18/23     azelastine (ASTELIN) 0.1 % nasal spray Place 2 sprays into both nostrils 2 (two) times daily. 02/04/24     EPINEPHrine (AUVI-Q) 0.3 mg/0.3 mL IJ SOAJ injection Inject 0.3 mg into the muscle as needed for anaphylaxis. 05/26/21   Ellamae Sia, DO  HYDROcodone bit-homatropine (HYCODAN) 5-1.5 MG/5ML syrup Take 5 mLs by mouth 4 (four) times daily as needed for cough 01/31/24     ibuprofen (ADVIL) 200 MG tablet Take 200 mg by mouth every 6 (six) hours as needed.    [provider]  lidocaine (XYLOCAINE) 2 % solution Use as directed 10 mLs in the mouth or throat every 3 (three) hours as needed for mouth pain. 06/19/23   Particia Nearing, PA-C  Magnesium 100 MG CAPS Take by mouth.    [provider]  metoprolol tartrate (LOPRESSOR) 25 MG tablet TAKE 1 TABLET BY  MOUTH TWICE A DAY 01/11/24   Patwardhan, Anabel Bene, MD  NON FORMULARY Beef liver    [provider]  Omega-3 Fatty Acids (OMEGA 3 500 PO) Take by mouth.    [provider]  valACYclovir (VALTREX) 1000 MG tablet Valtrex 2 g twice daily x 1 day(so 2 1000mg ) #4 05/23/23   Jeani Sow, MD  Vitamin D, Ergocalciferol, (DRISDOL) 1.25 MG (50000 UNIT) CAPS capsule Take 1 capsule (50,000 Units total) by mouth every 7 (seven)  days. 06/22/23   Allwardt, Crist Infante, PA-C      Allergies    Levofloxacin, Aspirin, Compazine [prochlorperazine edisylate], Other, Penicillins, and Prochlorperazine    Review of Systems   Review of Systems  Skin:  Positive for rash.  All other systems reviewed and are negative.   Physical Exam Updated Vital Signs BP 129/78   Pulse 80   Temp 97.8 F (36.6 C)   Resp 17   SpO2 100%  Physical Exam Vitals and nursing note reviewed.  Constitutional:      General: She is not in acute distress.    Appearance: She is well-developed.  HENT:     Head: Normocephalic and atraumatic.  Eyes:     Conjunctiva/sclera: Conjunctivae normal.  Cardiovascular:     Rate and Rhythm: Normal rate and regular rhythm.     Heart sounds: No murmur heard. Pulmonary:     Effort: Pulmonary effort is normal. No respiratory distress.     Breath sounds: Normal breath sounds.     Comments: No wheezing or signs of respiratory distress Abdominal:     Palpations: Abdomen is soft.     Tenderness: There is no abdominal tenderness.  Musculoskeletal:        General: No swelling.     Cervical back: Neck supple.  Skin:    General: Skin is warm and dry.     Capillary Refill: Capillary refill takes less than 2 seconds.     Comments: No appreciable rash, hives, or skin desquamination.  Neurological:     Mental Status: She is alert.  Psychiatric:        Mood and Affect: Mood normal.     ED Results / Procedures / Treatments   Labs (all labs ordered are listed, but only abnormal results are displayed) Labs Reviewed - No data to display  EKG None  Radiology No results found.  Procedures Procedures    Medications Ordered in ED Medications  diphenhydrAMINE (BENADRYL) capsule 50 mg (50 mg Oral Given 02/06/24 1749)  doxycycline (VIBRA-TABS) tablet 100 mg (100 mg Oral Given 02/06/24 2118)    ED Course/ Medical Decision Making/ A&P                                 Medical Decision  Making Risk Prescription drug management.   This patient presents to the ED for concern of pruritus. Differential diagnosis includes allergic reaction, sensitivity reaction, SJS, TEN   Medicines ordered and prescription drug management:  I ordered medication including doxycycline for pruritus  Reevaluation of the patient after these medicines showed that the patient improved I have reviewed the patients home medicines and have made adjustments as needed   Problem List / ED Course:  Patient presents to the ED  with pruritus. She is concerned this may be a reaction to Levaquin after she was started on this for chest congestion and a sinus infection not responding to azithromycin. States  she took medications as prescribed. Has now stopped Levaquin due to concerns of allergy. No prior known allergy to Levaquin. No chest pain, wheezing, or difficulty swallowing. No rash at this time. Patient received a dose of Benadryl in triage for symptoms which has helped. On my exam, this does not appear to be an acute allergic reaction, however I am concerned that the extent of the pruritus she was experiencing is related to sensitivity to the Levaquin. As doxycycline has good coverage for sinus infection and pneumonia, will switch patient to doxycycline with her history of PCN allergy. Patient not appearing to be in anaphylaxis so epinephrine not administered. Well appearing and will plan on discharging home with new prescription for doxycycline with instructions to return to the ED if reaction worsens. Advised patient to take an antihistamine if she were to continue having pruritus as this is helping well up to this point. No other focal concerns at this time and patient otherwise stable for discharge home with outpatient follow up. Patient discharged home in stable condition.   Final Clinical Impression(s) / ED Diagnoses Final diagnoses:  Bacterial sinusitis  Allergic reaction, initial encounter    Rx /  DC Orders ED Discharge Orders          Ordered    doxycycline (VIBRAMYCIN) 100 MG capsule  2 times daily        02/06/24 2112              Smitty Knudsen, PA-C 02/08/24 1315    Gwyneth Sprout, MD 02/10/24 [phone]

## 2024-02-08 NOTE — Telephone Encounter (Signed)
Please see pt response; discontinued levofloxacin and added to pt allergy list

## 2024-02-10 ENCOUNTER — Other Ambulatory Visit (HOSPITAL_BASED_OUTPATIENT_CLINIC_OR_DEPARTMENT_OTHER): Payer: Self-pay

## 2024-02-10 MED ORDER — AMPHETAMINE-DEXTROAMPHETAMINE 20 MG PO TABS
20.0000 mg | ORAL_TABLET | Freq: Two times a day (BID) | ORAL | 0 refills | Status: DC
  Filled 2024-02-10: qty 60, 30d supply, fill #0

## 2024-02-10 MED ORDER — HYDROXYZINE HCL 25 MG PO TABS
25.0000 mg | ORAL_TABLET | Freq: Every evening | ORAL | 1 refills | Status: DC | PRN
  Filled 2024-02-10: qty 30, 30d supply, fill #0
  Filled 2024-03-08: qty 30, 30d supply, fill #1

## 2024-03-06 ENCOUNTER — Other Ambulatory Visit: Payer: Self-pay

## 2024-03-06 ENCOUNTER — Telehealth: Payer: Self-pay

## 2024-03-06 DIAGNOSIS — D649 Anemia, unspecified: Secondary | ICD-10-CM

## 2024-03-06 NOTE — Telephone Encounter (Addendum)
 The patient spouse  contacted [place] to request the scheduling of iron infusions at the earliest convenience. The patient recently visited the emergency department for treatment of pruritus and has not been feeling well. The spouse believes that she would benefit from the iron infusions. Patient is schedule to come on the 03/07/24 for lab and provider. The patient is aware of the time of the appointment.

## 2024-03-07 ENCOUNTER — Encounter: Payer: Self-pay | Admitting: Nurse Practitioner

## 2024-03-07 ENCOUNTER — Other Ambulatory Visit: Payer: Self-pay | Admitting: Nurse Practitioner

## 2024-03-07 ENCOUNTER — Inpatient Hospital Stay

## 2024-03-07 ENCOUNTER — Inpatient Hospital Stay: Attending: Nurse Practitioner | Admitting: Nurse Practitioner

## 2024-03-07 VITALS — BP 121/80 | HR 103 | Temp 98.1°F | Resp 15 | Ht 67.0 in | Wt 214.2 lb

## 2024-03-07 DIAGNOSIS — D6801 Von willebrand disease, type 1: Secondary | ICD-10-CM | POA: Diagnosis not present

## 2024-03-07 DIAGNOSIS — D500 Iron deficiency anemia secondary to blood loss (chronic): Secondary | ICD-10-CM

## 2024-03-07 DIAGNOSIS — Z79899 Other long term (current) drug therapy: Secondary | ICD-10-CM | POA: Insufficient documentation

## 2024-03-07 DIAGNOSIS — G4700 Insomnia, unspecified: Secondary | ICD-10-CM | POA: Diagnosis not present

## 2024-03-07 DIAGNOSIS — N920 Excessive and frequent menstruation with regular cycle: Secondary | ICD-10-CM | POA: Insufficient documentation

## 2024-03-07 DIAGNOSIS — D649 Anemia, unspecified: Secondary | ICD-10-CM

## 2024-03-07 DIAGNOSIS — D509 Iron deficiency anemia, unspecified: Secondary | ICD-10-CM | POA: Insufficient documentation

## 2024-03-07 LAB — FERRITIN: Ferritin: 4 ng/mL — ABNORMAL LOW (ref 11–307)

## 2024-03-07 LAB — CBC WITH DIFFERENTIAL (CANCER CENTER ONLY)
Abs Immature Granulocytes: 0.01 10*3/uL (ref 0.00–0.07)
Basophils Absolute: 0.1 10*3/uL (ref 0.0–0.1)
Basophils Relative: 1 %
Eosinophils Absolute: 0.2 10*3/uL (ref 0.0–0.5)
Eosinophils Relative: 2 %
HCT: 34.4 % — ABNORMAL LOW (ref 36.0–46.0)
Hemoglobin: 10.8 g/dL — ABNORMAL LOW (ref 12.0–15.0)
Immature Granulocytes: 0 %
Lymphocytes Relative: 54 %
Lymphs Abs: 3.5 10*3/uL (ref 0.7–4.0)
MCH: 24.4 pg — ABNORMAL LOW (ref 26.0–34.0)
MCHC: 31.4 g/dL (ref 30.0–36.0)
MCV: 77.7 fL — ABNORMAL LOW (ref 80.0–100.0)
Monocytes Absolute: 0.3 10*3/uL (ref 0.1–1.0)
Monocytes Relative: 5 %
Neutro Abs: 2.4 10*3/uL (ref 1.7–7.7)
Neutrophils Relative %: 38 %
Platelet Count: 414 10*3/uL — ABNORMAL HIGH (ref 150–400)
RBC: 4.43 MIL/uL (ref 3.87–5.11)
RDW: 13.7 % (ref 11.5–15.5)
WBC Count: 6.4 10*3/uL (ref 4.0–10.5)
nRBC: 0 % (ref 0.0–0.2)

## 2024-03-07 NOTE — Progress Notes (Signed)
  [facility] OFFICE PROGRESS NOTE   Diagnosis: Iron deficiency anemia, von Willebrand disease type I  INTERVAL HISTORY:   Ms. Benavides returns for follow-up.  She was last seen 07/04/2023, hemoglobin 11.1, MCV 80, ferritin 5.  She received Venofer 300 mg 07/13/2023 and 07/20/2023.  She did not return for scheduled follow-up 08/15/2023.  She contacted the office requesting an appointment due to concern for recurrent iron deficiency.  She has noted recurrence of ice craving and restless legs.  She is fatigued.  She has a very heavy menstrual cycle.  She has not seen gynecology.  No other bleeding.  No shortness of breath.  She tolerated Venofer well, no signs of an allergic reaction.  She is unable to tolerate oral iron due to constipation.  Objective:  Vital signs in last 24 hours:  Blood pressure 121/80, pulse (!) 103, temperature 98.1 F (36.7 C), temperature source Temporal, resp. rate 15, height 5\' 7"  (1.702 m), weight 214 lb 3.2 oz (97.2 kg), SpO2 100%.    HEENT: No thrush or ulcers. Resp: Lungs clear bilaterally. Cardio: Regular rate and rhythm. GI: Abdomen soft and nontender.  No hepatosplenomegaly. Vascular: No leg edema.   Lab Results:  Lab Results  Component Value Date   WBC 6.4 03/07/2024   HGB 10.8 (L) 03/07/2024   HCT 34.4 (L) 03/07/2024   MCV 77.7 (L) 03/07/2024   PLT 414 (H) 03/07/2024   NEUTROABS 2.4 03/07/2024    Imaging:  No results found.  Medications: I have reviewed the patient's current medications.  Assessment/Plan: Iron deficiency anemia 08/27/2022 ferritin 4 Feraheme 510 mg 09/29/2022 and 10/06/2022 07/04/2023 hemoglobin 11.1, MCV 80, ferritin 5 Venofer 300 mg 07/13/2023 and 07/20/2023 03/07/2024 hemoglobin 10.8, MCV 77 Menorrhagia  Von Willebrand's type I Allergic asthma ADHD Insomnia  Disposition: Ms. Girardot appears stable.  She has a history of iron deficiency anemia and has received IV iron in the past.  She is experiencing  symptoms consistent with recurrent iron deficiency anemia.  CBC confirms anemia, indices are consistent with iron deficiency.  We will follow-up on the ferritin from today.  Plan for IV iron weekly x 2.  She agrees to proceed as above.  She will return for lab and follow-up in 4 months.  We are available to see her sooner if needed.    Lonna Cobb ANP/GNP-BC   03/07/2024  1:18 PM

## 2024-03-08 ENCOUNTER — Inpatient Hospital Stay

## 2024-03-08 ENCOUNTER — Telehealth: Payer: Self-pay | Admitting: Nurse Practitioner

## 2024-03-08 NOTE — Telephone Encounter (Signed)
 Left patient a vm regarding upcoming appointment

## 2024-03-14 IMAGING — DX DG CHEST 2V
2 series · 2 of 2 positions shown · non-contrast
Comparison: None.

CLINICAL DATA: Dyspnea

EXAM:
CHEST - 2 VIEW

[chest pa]
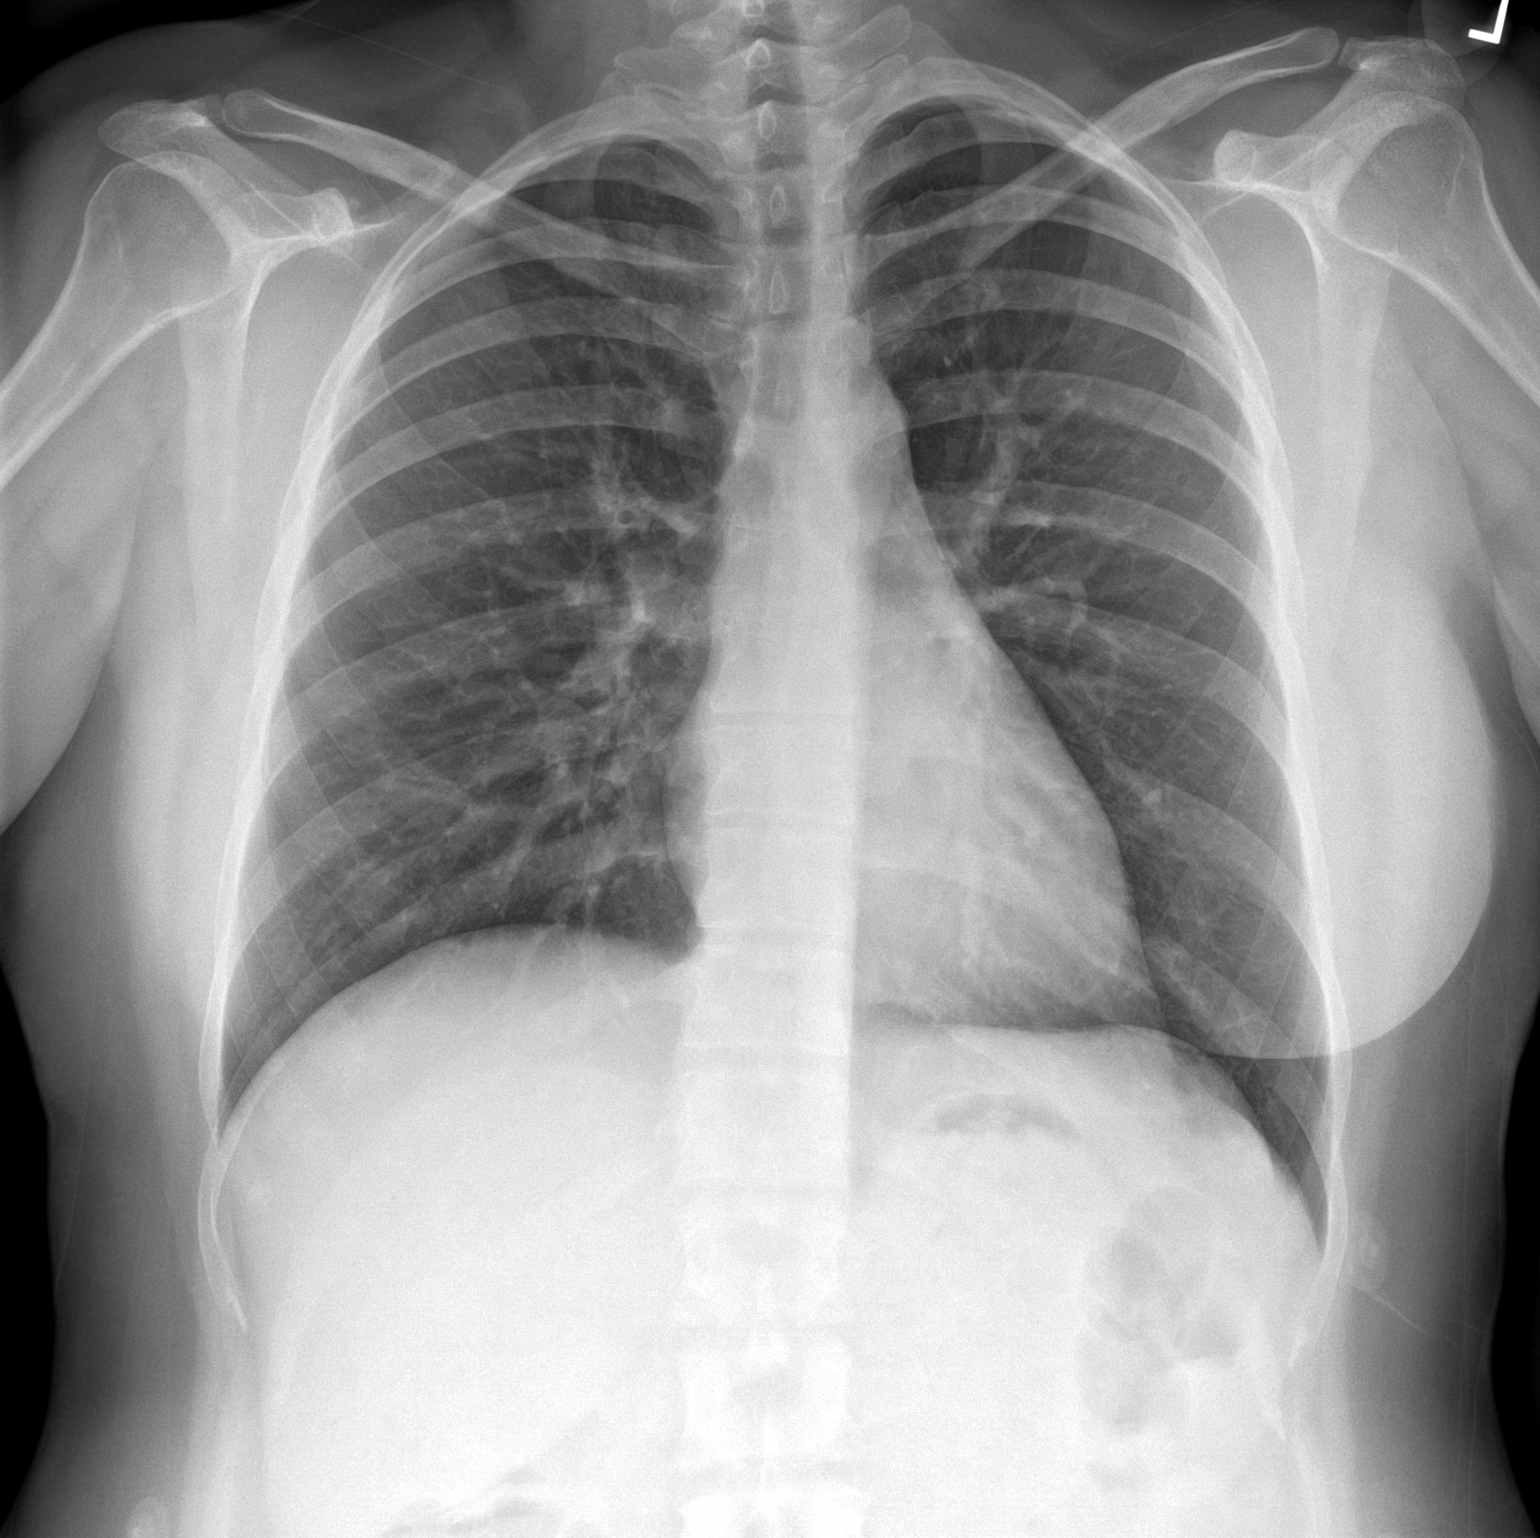

[chest lat]
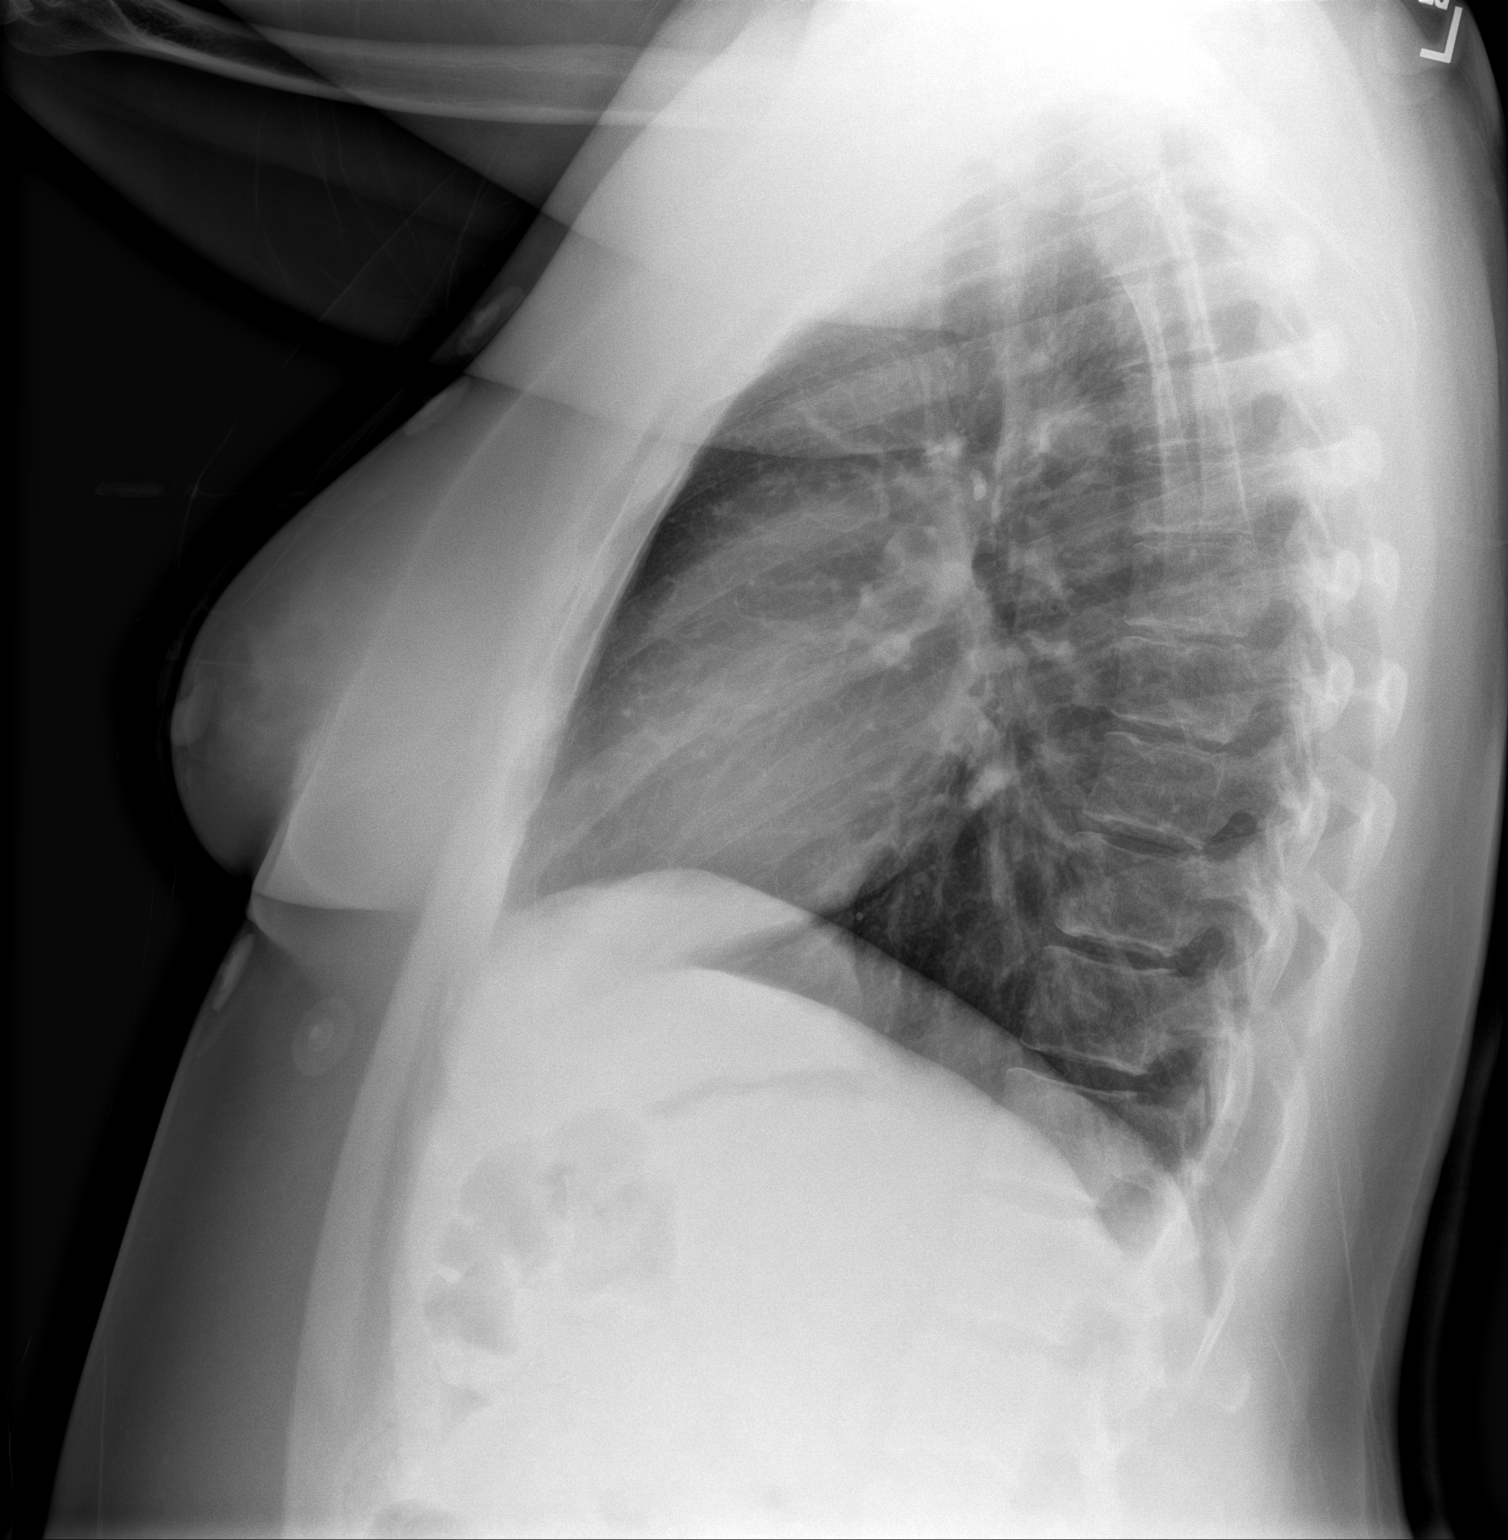

[2 of 2 positions shown; findings below may reference images not displayed]

FINDINGS: The heart size and mediastinal contours are within normal limits.
Both lungs are clear. The visualized skeletal structures are
unremarkable.
IMPRESSION: No active cardiopulmonary disease.

## 2024-03-15 ENCOUNTER — Other Ambulatory Visit (HOSPITAL_BASED_OUTPATIENT_CLINIC_OR_DEPARTMENT_OTHER): Payer: Self-pay

## 2024-03-15 MED ORDER — AMPHETAMINE-DEXTROAMPHETAMINE 20 MG PO TABS: 40.0000 mg | ORAL_TABLET | Freq: Every morning | ORAL | 0 refills | Status: DC

## 2024-03-15 MED ORDER — AMPHETAMINE-DEXTROAMPHETAMINE 20 MG PO TABS
20.0000 mg | ORAL_TABLET | Freq: Two times a day (BID) | ORAL | 0 refills | Status: DC
  Filled 2024-03-15: qty 60, 30d supply, fill #0

## 2024-03-15 MED ORDER — HYDROXYZINE HCL 25 MG PO TABS
25.0000 mg | ORAL_TABLET | Freq: Every evening | ORAL | 2 refills | Status: DC | PRN
Start: 2024-03-15 — End: 2024-07-13
  Filled 2024-03-15 – 2024-04-14 (×3): qty 60, 30d supply, fill #0
  Filled 2024-05-14: qty 60, 30d supply, fill #1

## 2024-03-15 MED ORDER — AMPHETAMINE-DEXTROAMPHETAMINE 20 MG PO TABS
40.0000 mg | ORAL_TABLET | Freq: Every morning | ORAL | 0 refills | Status: DC
  Filled 2024-04-14: qty 60, 30d supply, fill #0

## 2024-03-16 ENCOUNTER — Inpatient Hospital Stay

## 2024-03-16 ENCOUNTER — Other Ambulatory Visit: Payer: Self-pay

## 2024-03-16 VITALS — BP 115/59 | HR 90 | Temp 98.7°F | Resp 18

## 2024-03-16 DIAGNOSIS — D500 Iron deficiency anemia secondary to blood loss (chronic): Secondary | ICD-10-CM

## 2024-03-16 DIAGNOSIS — D509 Iron deficiency anemia, unspecified: Secondary | ICD-10-CM | POA: Diagnosis not present

## 2024-03-16 MED ORDER — SODIUM CHLORIDE 0.9 % IV SOLN
INTRAVENOUS | Status: DC
Start: 2024-03-16 — End: 2024-03-16

## 2024-03-16 MED ORDER — SODIUM CHLORIDE 0.9 % IV SOLN
300.0000 mg | Freq: Once | INTRAVENOUS | Status: AC
  Administered 2024-03-16: 300 mg via INTRAVENOUS
  Filled 2024-03-16: qty 300

## 2024-03-16 NOTE — Progress Notes (Signed)
 Pt declined to stay for 30 minute observation following completion of Venofer infusion. VSS at discharge, pt verbalizes understanding to call [facility] with questions/concerns.

## 2024-03-16 NOTE — Patient Instructions (Signed)

## 2024-03-17 ENCOUNTER — Other Ambulatory Visit (HOSPITAL_BASED_OUTPATIENT_CLINIC_OR_DEPARTMENT_OTHER): Payer: Self-pay

## 2024-03-17 MED ORDER — IBUPROFEN 800 MG PO TABS
800.0000 mg | ORAL_TABLET | Freq: Three times a day (TID) | ORAL | 0 refills | Status: DC
  Filled 2024-03-17: qty 30, 10d supply, fill #0

## 2024-03-17 MED ORDER — LIDOCAINE VISCOUS HCL 2 % MT SOLN
OROMUCOSAL | 0 refills | Status: DC
Start: 2024-03-17 — End: 2024-07-13
  Filled 2024-03-17: qty 200, 30d supply, fill #0

## 2024-03-17 MED ORDER — AZITHROMYCIN 250 MG PO TABS
ORAL_TABLET | ORAL | 0 refills | Status: AC
Start: 2024-03-17 — End: 2024-03-22
  Filled 2024-03-17: qty 6, 5d supply, fill #0

## 2024-03-23 ENCOUNTER — Inpatient Hospital Stay: Attending: Nurse Practitioner

## 2024-03-23 VITALS — BP 129/89 | HR 83 | Temp 98.5°F | Resp 18

## 2024-03-23 DIAGNOSIS — D500 Iron deficiency anemia secondary to blood loss (chronic): Secondary | ICD-10-CM

## 2024-03-23 DIAGNOSIS — D509 Iron deficiency anemia, unspecified: Secondary | ICD-10-CM | POA: Insufficient documentation

## 2024-03-23 MED ORDER — SODIUM CHLORIDE 0.9 % IV SOLN: INTRAVENOUS | Status: DC

## 2024-03-23 MED ORDER — SODIUM CHLORIDE 0.9 % IV SOLN
300.0000 mg | Freq: Once | INTRAVENOUS | Status: AC
  Administered 2024-03-23: 300.0065 mg via INTRAVENOUS
  Filled 2024-03-23: qty 200

## 2024-03-23 NOTE — Patient Instructions (Addendum)
 Iron Sucrose Injection What is this medication? IRON SUCROSE (EYE ern SOO krose) treats low levels of iron (iron deficiency anemia) in people with kidney disease. Iron is a mineral that plays an important role in making red blood cells, which carry oxygen from your lungs to the rest of your body. This medicine may be used for other purposes; ask your health care provider or pharmacist if you have questions. COMMON BRAND NAME(S): Venofer What should I tell my care team before I take this medication? They need to know if you have any of these conditions: Anemia not caused by low iron levels Heart disease High levels of iron in the blood Kidney disease Liver disease An unusual or allergic reaction to iron, other medications, foods, dyes, or preservatives Pregnant or trying to get pregnant Breastfeeding How should I use this medication? This medication is infused into a vein. It is given by your care team in a hospital or clinic setting. Talk to your care team about the use of this medication in children. While it may be prescribed for children as young as 2 years for selected conditions, precautions do apply. Overdosage: If you think you have taken too much of this medicine contact a poison control center or emergency room at once. NOTE: This medicine is only for you. Do not share this medicine with others. What if I miss a dose? Keep appointments for follow-up doses. It is important not to miss your dose. Call your care team if you are unable to keep an appointment. What may interact with this medication? Do not take this medication with any of the following: Deferoxamine Dimercaprol Other iron products This medication may also interact with the following: Chloramphenicol Deferasirox This list may not describe all possible interactions. Give your health care provider a list of all the medicines, herbs, non-prescription drugs, or dietary supplements you use. Also tell them if you smoke,  drink alcohol, or use illegal drugs. Some items may interact with your medicine. What should I watch for while using this medication? Visit your care team for regular checks on your progress. Tell your care team if your symptoms do not start to get better or if they get worse. You may need blood work done while you are taking this medication. You may need to eat more foods that contain iron. Talk to your care team. Foods that contain iron include whole grains or cereals, dried fruits, beans, peas, leafy green vegetables, and organ meats (liver, kidney). What side effects may I notice from receiving this medication? Side effects that you should report to your care team as soon as possible: Allergic reactions--skin rash, itching, hives, swelling of the face, lips, tongue, or throat Low blood pressure--dizziness, feeling faint or lightheaded, blurry vision Shortness of breath Side effects that usually do not require medical attention (report to your care team if they continue or are bothersome): Flushing Headache Joint pain Muscle pain Nausea Pain, redness, or irritation at injection site This list may not describe all possible side effects. Call your doctor for medical advice about side effects. You may report side effects to FDA at 1-800-FDA-1088. Where should I keep my medication? This medication is given in a hospital or clinic. It will not be stored at home. NOTE: This sheet is a summary. It may not cover all possible information. If you have questions about this medicine, talk to your doctor, pharmacist, or health care provider.  2024 Elsevier/Gold Standard (2023-07-27 00:00:00)*FEVER GREATER THAN 100.4 F (38 C) OR HIGHER *  CHILLS OR SWEATING *NAUSEA AND VOMITING THAT IS NOT CONTROLLED WITH YOUR NAUSEA MEDICATION *UNUSUAL SHORTNESS OF BREATH *UNUSUAL BRUISING OR BLEEDING *URINARY PROBLEMS (pain or burning when urinating, or frequent urination) *BOWEL PROBLEMS (unusual diarrhea,  constipation, pain near the anus) TENDERNESS IN MOUTH AND THROAT WITH OR WITHOUT PRESENCE OF ULCERS (sore throat, sores in mouth, or a toothache) UNUSUAL RASH, SWELLING OR PAIN  UNUSUAL VAGINAL DISCHARGE OR ITCHING   Items with * indicate a potential emergency and should be followed up as soon as possible or go to the Emergency Department if any problems should occur.  Please show the CHEMOTHERAPY ALERT CARD or IMMUNOTHERAPY ALERT CARD at check-in to the Emergency Department and triage nurse.  Should you have questions after your visit or need to cancel or reschedule your appointment, please contact [facility]PT OF [facility]  Dept: [phone]  and follow the prompts.  Office hours are 8:00 a.m. to 4:30 p.m. Monday - Friday. Please note that voicemails left after 4:00 p.m. may not be returned until the following business day.  We are closed weekends and major holidays. You have access to a nurse at all times for urgent questions. Please call the main number to the clinic Dept: [phone] and follow the prompts.   For any non-urgent questions, you may also contact your provider using MyChart. We now offer e-Visits for anyone 37 and older to request care online for non-urgent symptoms. For details visit [email or web address].   Also download the MyChart app! Go to the app store, search "MyChart", open the app, select [facility], and log in with your MyChart username and password.

## 2024-03-23 NOTE — Progress Notes (Signed)
 Patient presents today for iron infusion of Venofer.  Patient is in satisfactory condition with no new complaints voiced.  Vital signs are stable. IV access gained. We will proceed with infusion per provider orders.

## 2024-03-30 ENCOUNTER — Encounter: Payer: Self-pay | Admitting: *Deleted

## 2024-03-30 ENCOUNTER — Inpatient Hospital Stay

## 2024-03-30 VITALS — BP 118/86 | HR 86 | Temp 98.8°F | Resp 18

## 2024-03-30 DIAGNOSIS — D509 Iron deficiency anemia, unspecified: Secondary | ICD-10-CM | POA: Diagnosis not present

## 2024-03-30 DIAGNOSIS — D500 Iron deficiency anemia secondary to blood loss (chronic): Secondary | ICD-10-CM

## 2024-03-30 MED ORDER — SODIUM CHLORIDE 0.9 % IV SOLN: INTRAVENOUS | Status: DC

## 2024-03-30 MED ORDER — SODIUM CHLORIDE 0.9 % IV SOLN
300.0000 mg | Freq: Once | INTRAVENOUS | Status: AC
  Administered 2024-03-30: 300 mg via INTRAVENOUS
  Filled 2024-03-30: qty 200

## 2024-03-30 NOTE — Patient Instructions (Signed)

## 2024-04-01 ENCOUNTER — Emergency Department (HOSPITAL_BASED_OUTPATIENT_CLINIC_OR_DEPARTMENT_OTHER)
Admission: EM | Admit: 2024-04-01 | Discharge: 2024-04-01 | Disposition: A | Discharge status: Home / Self Care | Attending: Emergency Medicine | Admitting: Emergency Medicine

## 2024-04-01 ENCOUNTER — Other Ambulatory Visit: Payer: Self-pay

## 2024-04-01 ENCOUNTER — Encounter (HOSPITAL_BASED_OUTPATIENT_CLINIC_OR_DEPARTMENT_OTHER): Payer: Self-pay | Admitting: Emergency Medicine

## 2024-04-01 DIAGNOSIS — B349 Viral infection, unspecified: Secondary | ICD-10-CM | POA: Insufficient documentation

## 2024-04-01 DIAGNOSIS — J45909 Unspecified asthma, uncomplicated: Secondary | ICD-10-CM | POA: Diagnosis not present

## 2024-04-01 DIAGNOSIS — Z8616 Personal history of COVID-19: Secondary | ICD-10-CM | POA: Insufficient documentation

## 2024-04-01 DIAGNOSIS — R0981 Nasal congestion: Secondary | ICD-10-CM | POA: Diagnosis present

## 2024-04-01 LAB — COMPREHENSIVE METABOLIC PANEL WITH GFR
ALT: 21 U/L (ref 0–44)
AST: 16 U/L (ref 15–41)
Albumin: 4.3 g/dL (ref 3.5–5.0)
Alkaline Phosphatase: 54 U/L (ref 38–126)
Anion gap: 8 (ref 5–15)
BUN: 7 mg/dL (ref 6–20)
CO2: 26 mmol/L (ref 22–32)
Calcium: 8.7 mg/dL — ABNORMAL LOW (ref 8.9–10.3)
Chloride: 104 mmol/L (ref 98–111)
Creatinine, Ser: 0.56 mg/dL (ref 0.44–1.00)
GFR, Estimated: >60 mL/min (ref >60)
Glucose, Bld: 95 mg/dL (ref 70–99)
Potassium: 3.8 mmol/L (ref 3.5–5.1)
Sodium: 138 mmol/L (ref 135–145)
Total Bilirubin: 0.3 mg/dL (ref 0.0–1.2)
Total Protein: 7 g/dL (ref 6.5–8.1)

## 2024-04-01 LAB — URINALYSIS, ROUTINE W REFLEX MICROSCOPIC: RBC / HPF: >50 RBC/hpf (ref 0–5)

## 2024-04-01 LAB — MONONUCLEOSIS SCREEN: Mono Screen: NEGATIVE

## 2024-04-01 LAB — CBG MONITORING, ED: Glucose-Capillary: 86 mg/dL (ref 70–99)

## 2024-04-01 LAB — CBC
HCT: 37 % (ref 36.0–46.0)
Hemoglobin: 12.2 g/dL (ref 12.0–15.0)
MCH: 26.2 pg (ref 26.0–34.0)
MCHC: 33 g/dL (ref 30.0–36.0)
MCV: 79.6 fL — ABNORMAL LOW (ref 80.0–100.0)
Platelets: 368 10*3/uL (ref 150–400)
RBC: 4.65 MIL/uL (ref 3.87–5.11)
RDW: 18.2 % — ABNORMAL HIGH (ref 11.5–15.5)
WBC: 6.6 10*3/uL (ref 4.0–10.5)
nRBC: 0 % (ref 0.0–0.2)

## 2024-04-01 LAB — PREGNANCY, URINE: Preg Test, Ur: NEGATIVE

## 2024-04-01 LAB — RESP PANEL BY RT-PCR (RSV, FLU A&B, COVID)  RVPGX2
Influenza A by PCR: NEGATIVE
Influenza B by PCR: NEGATIVE
Resp Syncytial Virus by PCR: NEGATIVE
SARS Coronavirus 2 by RT PCR: NEGATIVE

## 2024-04-01 LAB — CK: Total CK: 53 U/L (ref 38–234)

## 2024-04-01 LAB — PROTIME-INR
INR: 1 (ref 0.8–1.2)
Prothrombin Time: 13.7 s (ref 11.4–15.2)

## 2024-04-01 LAB — PHOSPHORUS: Phosphorus: 3.3 mg/dL (ref 2.5–4.6)

## 2024-04-01 MED ORDER — ACETAMINOPHEN 500 MG PO TABS
1000.0000 mg | ORAL_TABLET | Freq: Once | ORAL | Status: AC
  Administered 2024-04-01: 1000 mg via ORAL
  Filled 2024-04-01: qty 2

## 2024-04-01 MED ORDER — KETOROLAC TROMETHAMINE 15 MG/ML IJ SOLN
15.0000 mg | Freq: Once | INTRAMUSCULAR | Status: AC
  Administered 2024-04-01: 15 mg via INTRAVENOUS
  Filled 2024-04-01: qty 1

## 2024-04-01 MED ORDER — LACTATED RINGERS IV BOLUS
1000.0000 mL | Freq: Once | INTRAVENOUS | Status: AC
  Administered 2024-04-01: 1000 mL via INTRAVENOUS

## 2024-04-01 NOTE — ED Triage Notes (Addendum)
 Pt via pov from home with fatigue, dizziness, generalized body aches. She reports that she has has 3x iron infusions over the last 3 weeks. She also reports that she has had some chest heaviness. Pt alert & oriented, nad noted.  Pt submitted urine sample that was very red with clots. She reports that she started her menstrual period on 4/3 and it has been intermittent: she will have very heavy flow with clots and then it will stop for a couple of hours and then starts back.

## 2024-04-01 NOTE — Discharge Instructions (Addendum)
 Thank you for coming to [facility] Emergency Department. You were seen for fatigue, dizziness. We did an exam, labs, and imaging, and these showed no acute findings. It is possible you have a viral illness or possible you are having symptoms related to your iron infusions. You can take tylenol 1,000 mg every 8 hours for pain/discomfort. Please stay well hydrated. Please call your heme-onc provider in the morning to discuss possible side effects of IV iron infusion.  Please also follow-up with an OB/GYN physician within the next 1 to 2 weeks to discuss your heavy menstrual bleeding.  Do not hesitate to return to the ED or call 911 if you experience: -Worsening symptoms -Shortness of breath, chest pain -Nausea/vomiting so severe you cannot eat/drink anything -Lightheadedness, passing out -Fevers/chills -Anything else that concerns you

## 2024-04-01 NOTE — ED Provider Notes (Signed)
 [place] EMERGENCY DEPARTMENT AT [facility] Provider Note   CSN: [id number] Arrival date & time: 04/01/24  1956     History  Chief Complaint  Patient presents with   Fatigue   Generalized Body Aches   Dizziness    Kendra Patton is a 38 y.o. female with PMH as listed below who presents pov from home with fatigue, dizziness, generalized body aches. Also endorses some nasal congestion/rhinorrhea. Works in [employer] school and states she is always sick with some flu-like sxs. She reports that she has has 3x iron  infusions over the last 3 weeks and wonders if these could be side effects from the iron  infusions. She also reports that she has had some chest heaviness that is mostly resolved now. Denies SOB, cough, diarrhea, urinary sxs, leg swelling. Endorses some nausea with no vomiting. Also endorses dizziness with standing. Hasn't been eating/drinking very well the last couple of days because she hasn't felt well. Patient endorses vaginal bleeding with her menstrual cycle which she has a history of. This is partly why she gets iron  infusions. She denies vaginal pain, itching, burning, or abnormal discharge.      Past Medical History:  Diagnosis Date   Anxiety    age 76   Asthma    COVID 04/2021   Depression    Eczema    High-risk pregnancy supervision 10/27/2015   History of CMV    last pregnancy   History of OCD (obsessive compulsive disorder)    IBS (irritable bowel syndrome)    Scoliosis    Von Willebrand disease (HCC)        Home Medications Prior to Admission medications   Medication Sig Start Date End Date Taking? Authorizing Provider  albuterol  (VENTOLIN  HFA) 108 (90 Base) MCG/ACT inhaler Inhale 2 puffs into the lungs every 4 (four) hours as needed for wheezing or shortness of breath (coughing fits). Patient not taking: Reported on 03/07/2024 03/11/22   Trudy Fusi, DO  amphetamine -dextroamphetamine  (ADDERALL) 20 MG tablet Take 2 tablets (40 mg total) by  mouth every morning for focus and attention 04/14/24     amphetamine -dextroamphetamine  (ADDERALL) 20 MG tablet Take 2 tablets (40 mg total) by mouth every morning for focus and attention 05/14/26     amphetamine -dextroamphetamine  (ADDERALL) 20 MG tablet Take 1 tablet (20 mg total) by mouth 2 (two) times daily. 03/15/24     azelastine  (ASTELIN ) 0.1 % nasal spray Place 2 sprays into both nostrils 2 (two) times daily. Patient not taking: Reported on 03/07/2024 02/04/24     EPINEPHrine  (AUVI-Q ) 0.3 mg/0.3 mL IJ SOAJ injection Inject 0.3 mg into the muscle as needed for anaphylaxis. Patient not taking: Reported on 03/07/2024 05/26/21   Trudy Fusi, DO  hydrOXYzine  (ATARAX ) 25 MG tablet Take 1-2 tablets (25-50 mg total) by mouth at bedtime as needed. 03/15/24     ibuprofen  (ADVIL ) 200 MG tablet Take 200 mg by mouth every 6 (six) hours as needed.    [provider]  ibuprofen  (ADVIL ) 800 MG tablet Take 1 tablet (800 mg total) by mouth 3 (three) times daily (pain). 03/17/24     lidocaine  (XYLOCAINE ) 2 % solution Take 1 application (mucous membrane) 2 to 4 times per day (Pain) SWISH GARGLE AND SPIT 03/17/24     Magnesium 100 MG CAPS Take by mouth. Patient not taking: Reported on 03/07/2024    [provider]  metoprolol  tartrate (LOPRESSOR ) 25 MG tablet TAKE 1 TABLET BY MOUTH TWICE A DAY 01/11/24  Patwardhan, Kaye Parsons, MD  NON FORMULARY Beef liver Patient not taking: Reported on 03/07/2024    [provider]  Omega-3 Fatty Acids (OMEGA 3 500 PO) Take by mouth.    [provider]  valACYclovir  (VALTREX ) 1000 MG tablet Valtrex  2 g twice daily x 1 day(so 2 1000mg ) #4 Patient not taking: Reported on 03/07/2024 05/23/23   Christel Cousins, MD      Allergies    Levofloxacin , Aspirin, Compazine [prochlorperazine edisylate], Other, Penicillins, and Prochlorperazine    Review of Systems   Review of Systems A 10 point review of systems was performed and is negative unless otherwise  reported in HPI.  Physical Exam Updated Vital Signs BP (!) 154/112 (BP Location: Right Arm)   Pulse (!) 109   Temp 97.7 F (36.5 C) (Oral)   Resp 18   Ht 5\' 7"  (1.702 m)   Wt 97.2 kg   LMP 03/22/2024 (Exact Date)   SpO2 100%   BMI 33.56 kg/m  Physical Exam General: Normal appearing female, lying in bed.  HEENT: PERRLA, Sclera anicteric, MMM, trachea midline.  Cardiology: RRR, no murmurs/rubs/gallops. BL radial and DP pulses equal bilaterally.  Resp: Normal respiratory rate and effort. CTAB, no wheezes, rhonchi, crackles.  Abd: Soft, non-tender, non-distended. No rebound tenderness or guarding.  GU: Deferred. MSK: No peripheral edema or signs of trauma. Extremities without deformity or TTP. No cyanosis or clubbing. Skin: warm, dry. No rashes or lesions. Back: No CVA tenderness Neuro: A&Ox4, CNs II-XII grossly intact. MAEs. Sensation grossly intact.  Psych: Normal mood and affect.   ED Results / Procedures / Treatments   Labs (all labs ordered are listed, but only abnormal results are displayed) Labs Reviewed  CBC - Abnormal; Notable for the following components:      Result Value   MCV 79.6 (*)    RDW 18.2 (*)    All other components within normal limits  URINALYSIS, ROUTINE W REFLEX MICROSCOPIC - Abnormal; Notable for the following components:   Color, Urine RED (*)    APPearance HAZY (*)    Glucose, UA   (*)    Value: TEST NOT REPORTED DUE TO COLOR INTERFERENCE OF URINE PIGMENT   Hgb urine dipstick   (*)    Value: TEST NOT REPORTED DUE TO COLOR INTERFERENCE OF URINE PIGMENT   Bilirubin Urine   (*)    Value: TEST NOT REPORTED DUE TO COLOR INTERFERENCE OF URINE PIGMENT   Ketones, ur   (*)    Value: TEST NOT REPORTED DUE TO COLOR INTERFERENCE OF URINE PIGMENT   Protein, ur   (*)    Value: TEST NOT REPORTED DUE TO COLOR INTERFERENCE OF URINE PIGMENT   Nitrite   (*)    Value: TEST NOT REPORTED DUE TO COLOR INTERFERENCE OF URINE PIGMENT   Leukocytes,Ua   (*)    Value:  TEST NOT REPORTED DUE TO COLOR INTERFERENCE OF URINE PIGMENT   Bacteria, UA FEW (*)    Crystals PRESENT (*)    All other components within normal limits  COMPREHENSIVE METABOLIC PANEL WITH GFR - Abnormal; Notable for the following components:   Calcium 8.7 (*)    All other components within normal limits  RESP PANEL BY RT-PCR (RSV, FLU A&B, COVID)  RVPGX2  PREGNANCY, URINE  PROTIME-INR  PHOSPHORUS  CK  MONONUCLEOSIS SCREEN  CBG MONITORING, ED    EKG EKG Interpretation Date/Time:  Sunday April 01 2024 20:48:21 EDT Ventricular Rate:  96 PR Interval:  135  QRS Duration:  87 QT Interval:  368 QTC Calculation: 465 R Axis:   45  Text Interpretation: Sinus rhythm Confirmed by Annita Kindle [phone]) on 04/01/2024 10:47:37 PM Also confirmed by Annita Kindle [phone]), editor Berna Breslow [phone])  on 04/02/2024 8:15:00 AM  Radiology No results found.  Procedures Procedures    Medications Ordered in ED Medications  lactated ringers  bolus 1,000 mL (0 mLs Intravenous Stopped 04/01/24 2249)  ketorolac  (TORADOL ) 15 MG/ML injection 15 mg (15 mg Intravenous Given 04/01/24 2206)  acetaminophen  (TYLENOL ) tablet 1,000 mg (1,000 mg Oral Given 04/01/24 2206)    ED Course/ Medical Decision Making/ A&P                          Medical Decision Making Amount and/or Complexity of Data Reviewed Labs: ordered. Decision-making details documented in ED Course.  Risk OTC drugs. Prescription drug management.    This patient presents to the ED for concern of nasal congestion, fatigue, body aches, nausea/dizziness, this involves an extensive number of treatment options, and is a complaint that carries with it a high risk of complications and morbidity.  I considered the following differential and admission for this acute, potentially life threatening condition.  MDM:    DDX for this patient includes but is not limited to:  Mostly likely consider viral infection given patient's nasal congestion,  fatigue, and body aches. She is likely dehydrated with orthostatic dizziness and feels much improved after IVF. She requests phosphate and mono tests as well as she read online about her symptoms and wonders if her phosphate is low; I reassured her it is likely normal but she requested the test, which were normal. She does have heavy vaginal bleeding history but her Hgb today is 12.2, reassuring and improving, sxs today not caused by anemia. No LEE to indicate HF. EKG without signs of ischemia. No rhabdo or electrolyte derangements. No cough/SOB to indicate PNA. She also wonders about side effects from her iron  infusions. I encouraged her to message her hem/onc provider about this possibility tomorrow. I encouraged patient to stay well hydrated.  Overall patient is well appearing and I doubt a life threatening cause of her sxs, I believe she is stable for o/p f/u.    Clinical Course as of 04/04/24 2218  Paulene Boron Apr 01, 2024  2137 Hemoglobin: 12.2 [HN]  2158 Urinalysis, Routine w reflex microscopic -Urine, Unspecified Source(!) Interference from heavy menstrual bleeding. Hasn't had any dysuria/frequency to indicate UTI. No leukocytosis or fever. [HN]  2234 CK Total: 53 wnl [HN]  2234 Phosphorus: 3.3 wnl [HN]  2234 Mono Screen: NEGATIVE neg [HN]  2303 Patient is feeling much improved after medications and fluids.  She likely has a viral illness or side effects from IV iron  infusions.  She is having heavy period right now which is irregular and I advise follow-up with OB to try to avoid dropping her hemoglobin again in the future and reduce need for further IV iron  infusions.  I also advised follow-up with heme-onc, and for patient to call them in the morning to discuss possible side effects of IV iron  infusions and for further guidance, however patient is symptomatically improved and she is safe to follow-up outpatient.  Advised Tylenol  for pain control and staying well-hydrated at home.  Given discharge  instructions and return precautions, all questions answered to patient satisfaction. [HN]    Clinical Course User Index [HN] Merdis Stalling, MD    Labs: I Ordered,  and personally interpreted labs.  The pertinent results include:  those listed above   Additional history obtained from chart review, husband at bedside.   Cardiac Monitoring: The patient was maintained on a cardiac monitor.  I personally viewed and interpreted the cardiac monitored which showed an underlying rhythm of: NSR  Reevaluation: After the interventions noted above, I reevaluated the patient and found that they have :improved  Social Determinants of Health: Lives independently  Disposition:  DC w/ discharge instructions/return precautions. All questions answered to patient's satisfaction.    Co morbidities that complicate the patient evaluation  Past Medical History:  Diagnosis Date   Anxiety    age 33   Asthma    COVID 04/2021   Depression    Eczema    High-risk pregnancy supervision 10/27/2015   History of CMV    last pregnancy   History of OCD (obsessive compulsive disorder)    IBS (irritable bowel syndrome)    Scoliosis    Von Willebrand disease (HCC)      Medicines No orders of the defined types were placed in this encounter.   I have reviewed the patients home medicines and have made adjustments as needed  Problem List / ED Course: Problem List Items Addressed This Visit   None Visit Diagnoses       Viral syndrome    -  Primary                   This note was created using dictation software, which may contain spelling or grammatical errors.    Merdis Stalling, MD 04/09/24 [phone]

## 2024-04-02 ENCOUNTER — Encounter: Payer: Self-pay | Admitting: Nurse Practitioner

## 2024-04-14 ENCOUNTER — Other Ambulatory Visit (HOSPITAL_BASED_OUTPATIENT_CLINIC_OR_DEPARTMENT_OTHER): Payer: Self-pay

## 2024-04-16 ENCOUNTER — Encounter: Payer: Self-pay | Admitting: Nurse Practitioner

## 2024-05-12 ENCOUNTER — Other Ambulatory Visit (HOSPITAL_BASED_OUTPATIENT_CLINIC_OR_DEPARTMENT_OTHER): Payer: Self-pay

## 2024-05-12 MED ORDER — CEFDINIR 300 MG PO CAPS
600.0000 mg | ORAL_CAPSULE | Freq: Every day | ORAL | 0 refills | Status: DC
  Filled 2024-05-12: qty 20, 10d supply, fill #0

## 2024-05-14 ENCOUNTER — Other Ambulatory Visit (HOSPITAL_BASED_OUTPATIENT_CLINIC_OR_DEPARTMENT_OTHER): Payer: Self-pay

## 2024-05-15 ENCOUNTER — Other Ambulatory Visit (HOSPITAL_COMMUNITY): Payer: Self-pay

## 2024-05-15 ENCOUNTER — Other Ambulatory Visit (HOSPITAL_BASED_OUTPATIENT_CLINIC_OR_DEPARTMENT_OTHER): Payer: Self-pay

## 2024-05-15 ENCOUNTER — Other Ambulatory Visit: Payer: Self-pay

## 2024-05-15 MED ORDER — AMPHETAMINE-DEXTROAMPHETAMINE 20 MG PO TABS
40.0000 mg | ORAL_TABLET | Freq: Every morning | ORAL | 0 refills | Status: DC
  Filled 2024-05-15: qty 60, 30d supply, fill #0

## 2024-05-16 ENCOUNTER — Other Ambulatory Visit (HOSPITAL_COMMUNITY): Payer: Self-pay

## 2024-05-17 ENCOUNTER — Other Ambulatory Visit (HOSPITAL_BASED_OUTPATIENT_CLINIC_OR_DEPARTMENT_OTHER): Payer: Self-pay

## 2024-05-17 MED ORDER — TRANEXAMIC ACID 650 MG PO TABS
1300.0000 mg | ORAL_TABLET | Freq: Three times a day (TID) | ORAL | 3 refills | Status: DC
  Filled 2024-05-17: qty 30, 5d supply, fill #0

## 2024-05-18 ENCOUNTER — Other Ambulatory Visit (HOSPITAL_BASED_OUTPATIENT_CLINIC_OR_DEPARTMENT_OTHER): Payer: Self-pay

## 2024-05-18 MED ORDER — FLUCONAZOLE 150 MG PO TABS
150.0000 mg | ORAL_TABLET | Freq: Every day | ORAL | 1 refills | Status: DC
  Filled 2024-05-18: qty 1, 1d supply, fill #0

## 2024-05-22 ENCOUNTER — Other Ambulatory Visit (HOSPITAL_BASED_OUTPATIENT_CLINIC_OR_DEPARTMENT_OTHER): Payer: Self-pay

## 2024-05-22 MED ORDER — FLUCONAZOLE 150 MG PO TABS
150.0000 mg | ORAL_TABLET | ORAL | 1 refills | Status: AC
  Filled 2024-05-22: qty 2, 4d supply, fill #0

## 2024-05-22 MED ORDER — NYSTATIN 100000 UNIT/GM EX CREA
TOPICAL_CREAM | CUTANEOUS | 0 refills | Status: AC
  Filled 2024-05-22: qty 15, 30d supply, fill #0

## 2024-06-08 ENCOUNTER — Other Ambulatory Visit: Payer: Self-pay | Admitting: Physician Assistant

## 2024-06-08 DIAGNOSIS — D509 Iron deficiency anemia, unspecified: Secondary | ICD-10-CM

## 2024-06-11 ENCOUNTER — Encounter: Payer: Self-pay | Admitting: Nurse Practitioner

## 2024-06-11 ENCOUNTER — Other Ambulatory Visit: Payer: Self-pay

## 2024-06-11 ENCOUNTER — Other Ambulatory Visit (HOSPITAL_BASED_OUTPATIENT_CLINIC_OR_DEPARTMENT_OTHER): Payer: Self-pay

## 2024-06-11 MED ORDER — METOPROLOL TARTRATE 25 MG PO TABS
25.0000 mg | ORAL_TABLET | Freq: Two times a day (BID) | ORAL | 1 refills | Status: DC
  Filled 2024-06-11: qty 180, 90d supply, fill #0

## 2024-06-14 ENCOUNTER — Other Ambulatory Visit (HOSPITAL_BASED_OUTPATIENT_CLINIC_OR_DEPARTMENT_OTHER): Payer: Self-pay

## 2024-06-14 MED ORDER — AMPHETAMINE-DEXTROAMPHETAMINE 20 MG PO TABS
40.0000 mg | ORAL_TABLET | Freq: Every morning | ORAL | 0 refills | Status: DC
  Filled 2024-06-14: qty 60, 30d supply, fill #0

## 2024-06-14 MED ORDER — HYDROXYZINE HCL 25 MG PO TABS
25.0000 mg | ORAL_TABLET | Freq: Every evening | ORAL | 2 refills | Status: AC | PRN
  Filled 2024-06-14: qty 60, 30d supply, fill #0

## 2024-06-14 MED ORDER — AMPHETAMINE-DEXTROAMPHETAMINE 20 MG PO TABS
20.0000 mg | ORAL_TABLET | Freq: Two times a day (BID) | ORAL | 0 refills | Status: DC
  Filled 2024-08-15: qty 60, 30d supply, fill #0

## 2024-06-14 MED ORDER — AMPHETAMINE-DEXTROAMPHETAMINE 20 MG PO TABS
40.0000 mg | ORAL_TABLET | Freq: Every morning | ORAL | 0 refills | Status: DC
  Filled 2024-07-16: qty 60, 30d supply, fill #0

## 2024-06-20 ENCOUNTER — Other Ambulatory Visit (HOSPITAL_BASED_OUTPATIENT_CLINIC_OR_DEPARTMENT_OTHER): Payer: Self-pay

## 2024-06-20 MED ORDER — NORETHINDRONE 0.35 MG PO TABS
1.0000 | ORAL_TABLET | Freq: Every day | ORAL | 1 refills | Status: DC
  Filled 2024-06-20: qty 84, 84d supply, fill #0

## 2024-06-30 ENCOUNTER — Other Ambulatory Visit (HOSPITAL_BASED_OUTPATIENT_CLINIC_OR_DEPARTMENT_OTHER): Payer: Self-pay

## 2024-07-06 ENCOUNTER — Inpatient Hospital Stay

## 2024-07-06 ENCOUNTER — Inpatient Hospital Stay: Admitting: Nurse Practitioner

## 2024-07-13 ENCOUNTER — Encounter: Payer: Self-pay | Admitting: Nurse Practitioner

## 2024-07-13 ENCOUNTER — Inpatient Hospital Stay: Attending: Nurse Practitioner

## 2024-07-13 ENCOUNTER — Inpatient Hospital Stay: Admitting: Nurse Practitioner

## 2024-07-13 VITALS — BP 121/86 | HR 97 | Temp 98.1°F | Resp 18 | Ht 67.0 in | Wt 222.5 lb

## 2024-07-13 DIAGNOSIS — D500 Iron deficiency anemia secondary to blood loss (chronic): Secondary | ICD-10-CM

## 2024-07-13 DIAGNOSIS — D509 Iron deficiency anemia, unspecified: Secondary | ICD-10-CM | POA: Insufficient documentation

## 2024-07-13 DIAGNOSIS — D649 Anemia, unspecified: Secondary | ICD-10-CM | POA: Diagnosis not present

## 2024-07-13 DIAGNOSIS — D6801 Von willebrand disease, type 1: Secondary | ICD-10-CM | POA: Diagnosis not present

## 2024-07-13 LAB — CBC WITH DIFFERENTIAL (CANCER CENTER ONLY)
Abs Immature Granulocytes: 0.01 K/uL (ref 0.00–0.07)
Basophils Absolute: 0.1 K/uL (ref 0.0–0.1)
Basophils Relative: 1 %
Eosinophils Absolute: 0.3 K/uL (ref 0.0–0.5)
Eosinophils Relative: 3 %
HCT: 37.8 % (ref 36.0–46.0)
Hemoglobin: 12.7 g/dL (ref 12.0–15.0)
Immature Granulocytes: 0 %
Lymphocytes Relative: 42 %
Lymphs Abs: 3.3 K/uL (ref 0.7–4.0)
MCH: 28.7 pg (ref 26.0–34.0)
MCHC: 33.6 g/dL (ref 30.0–36.0)
MCV: 85.5 fL (ref 80.0–100.0)
Monocytes Absolute: 0.6 K/uL (ref 0.1–1.0)
Monocytes Relative: 7 %
Neutro Abs: 3.7 K/uL (ref 1.7–7.7)
Neutrophils Relative %: 47 %
Platelet Count: 323 K/uL (ref 150–400)
RBC: 4.42 MIL/uL (ref 3.87–5.11)
RDW: 12.6 % (ref 11.5–15.5)
WBC Count: 8 K/uL (ref 4.0–10.5)
nRBC: 0 % (ref 0.0–0.2)

## 2024-07-13 LAB — FERRITIN: Ferritin: 13 ng/mL (ref 11–307)

## 2024-07-13 NOTE — Progress Notes (Signed)
  [facility] OFFICE PROGRESS NOTE   Diagnosis: Iron  deficiency anemia, von Willebrand disease type I  INTERVAL HISTORY:   Kendra Patton returns for follow-up.  She completed Feraheme infusions 03/16/2024, 03/23/2024 and 03/30/2024.  She was seen in the emergency department 04/01/2024 with nasal congestion, body aches, fatigue and dizziness.  She was felt to likely have a viral infection plus dehydration.  She improved after IV fluids.  Main complaint continues to be a poor energy level.  She is no longer craving ice.  Continued menorrhagia.  She reports a hysterectomy is being considered.  She denies other bleeding except occasional blood related to hemorrhoids.  Objective:  Vital signs in last 24 hours:  Blood pressure 121/86, pulse 97, temperature 98.1 F (36.7 C), temperature source Temporal, resp. rate 18, height 5' 7 (1.702 m), weight 222 lb 8 oz (100.9 kg), SpO2 100%.    Resp: Lungs clear bilaterally. Cardio: Regular rate and rhythm. GI: No hepatosplenomegaly. Vascular: No leg edema.   Lab Results:  Lab Results  Component Value Date   WBC 8.0 07/13/2024   HGB 12.7 07/13/2024   HCT 37.8 07/13/2024   MCV 85.5 07/13/2024   PLT 323 07/13/2024   NEUTROABS 3.7 07/13/2024    Imaging:  No results found.  Medications: I have reviewed the patient's current medications.  Assessment/Plan: Iron  deficiency anemia 08/27/2022 ferritin 4 Feraheme 510 mg 09/29/2022 and 10/06/2022 07/04/2023 hemoglobin 11.1, MCV 80, ferritin 5 Venofer  300 mg 07/13/2023 and 07/20/2023 03/07/2024 hemoglobin 10.8, MCV 77 Venofer  300 mg 03/16/2024, 03/23/2024, 03/30/2024 Menorrhagia  Von Willebrand's type I Allergic asthma ADHD Insomnia  Disposition: [name] appears stable.  She completed 3 weekly Venofer  infusions March/April 2025..  Labs from today show correction of hemoglobin and MCV into normal range, ferritin is also in normal range but at the low end.  We discussed excessive  menstrual blood loss is the most likely cause of the iron  deficiency anemia.  She reports an underlying cause of the menorrhagia has not been determined.  Von Willebrand's may be contributing.  She is uncomfortable with tranexamic acid  and DDAVP due to the side effect profile.  She is considering a hysterectomy or ablation procedure (has not been scheduled).  She has follow-up with her gynecologist in about 2 months for more discussion.  She will return for follow-up labs and an office visit with possible IV iron  in 4 weeks.    Olam Ned ANP/GNP-BC   07/13/2024  9:21 AM

## 2024-07-16 ENCOUNTER — Other Ambulatory Visit (HOSPITAL_BASED_OUTPATIENT_CLINIC_OR_DEPARTMENT_OTHER): Payer: Self-pay

## 2024-07-16 ENCOUNTER — Other Ambulatory Visit: Payer: Self-pay

## 2024-07-17 ENCOUNTER — Other Ambulatory Visit (HOSPITAL_BASED_OUTPATIENT_CLINIC_OR_DEPARTMENT_OTHER): Payer: Self-pay

## 2024-07-25 ENCOUNTER — Telehealth: Payer: Self-pay | Admitting: Nurse Practitioner

## 2024-07-25 NOTE — Telephone Encounter (Signed)
 Attempted to contact Kendra Patton regarding follow-up appointments.  Left message to please return my call.

## 2024-07-30 ENCOUNTER — Other Ambulatory Visit (HOSPITAL_BASED_OUTPATIENT_CLINIC_OR_DEPARTMENT_OTHER): Payer: Self-pay

## 2024-07-30 MED ORDER — LIDOCAINE VISCOUS HCL 2 % MT SOLN
15.0000 mL | Freq: Four times a day (QID) | OROMUCOSAL | 0 refills | Status: DC
  Filled 2024-07-30: qty 200, 4d supply, fill #0

## 2024-07-30 MED ORDER — TRIAMCINOLONE ACETONIDE 0.5 % EX OINT
1.0000 | TOPICAL_OINTMENT | Freq: Two times a day (BID) | CUTANEOUS | 0 refills | Status: DC
  Filled 2024-07-30: qty 60, 30d supply, fill #0

## 2024-08-03 ENCOUNTER — Encounter: Payer: Self-pay | Admitting: Nurse Practitioner

## 2024-08-10 ENCOUNTER — Inpatient Hospital Stay: Admitting: Oncology

## 2024-08-10 ENCOUNTER — Encounter: Payer: Self-pay | Admitting: Nurse Practitioner

## 2024-08-10 ENCOUNTER — Encounter: Payer: Self-pay | Admitting: Oncology

## 2024-08-10 ENCOUNTER — Inpatient Hospital Stay: Attending: Nurse Practitioner

## 2024-08-10 VITALS — BP 127/86 | HR 98 | Temp 98.1°F | Ht 67.0 in | Wt 223.1 lb

## 2024-08-10 DIAGNOSIS — N920 Excessive and frequent menstruation with regular cycle: Secondary | ICD-10-CM | POA: Insufficient documentation

## 2024-08-10 DIAGNOSIS — G4700 Insomnia, unspecified: Secondary | ICD-10-CM | POA: Insufficient documentation

## 2024-08-10 DIAGNOSIS — D649 Anemia, unspecified: Secondary | ICD-10-CM

## 2024-08-10 DIAGNOSIS — D6801 Von willebrand disease, type 1: Secondary | ICD-10-CM | POA: Insufficient documentation

## 2024-08-10 DIAGNOSIS — J45909 Unspecified asthma, uncomplicated: Secondary | ICD-10-CM | POA: Insufficient documentation

## 2024-08-10 DIAGNOSIS — D509 Iron deficiency anemia, unspecified: Secondary | ICD-10-CM | POA: Diagnosis not present

## 2024-08-10 DIAGNOSIS — F909 Attention-deficit hyperactivity disorder, unspecified type: Secondary | ICD-10-CM | POA: Insufficient documentation

## 2024-08-10 DIAGNOSIS — D500 Iron deficiency anemia secondary to blood loss (chronic): Secondary | ICD-10-CM | POA: Insufficient documentation

## 2024-08-10 LAB — CBC WITH DIFFERENTIAL (CANCER CENTER ONLY)
Abs Immature Granulocytes: 0.01 K/uL (ref 0.00–0.07)
Basophils Absolute: 0.1 K/uL (ref 0.0–0.1)
Basophils Relative: 1 %
Eosinophils Absolute: 0.2 K/uL (ref 0.0–0.5)
Eosinophils Relative: 4 %
HCT: 36.8 % (ref 36.0–46.0)
Hemoglobin: 12.2 g/dL (ref 12.0–15.0)
Immature Granulocytes: 0 %
Lymphocytes Relative: 38 %
Lymphs Abs: 2.6 K/uL (ref 0.7–4.0)
MCH: 28 pg (ref 26.0–34.0)
MCHC: 33.2 g/dL (ref 30.0–36.0)
MCV: 84.4 fL (ref 80.0–100.0)
Monocytes Absolute: 0.4 K/uL (ref 0.1–1.0)
Monocytes Relative: 6 %
Neutro Abs: 3.5 K/uL (ref 1.7–7.7)
Neutrophils Relative %: 51 %
Platelet Count: 389 K/uL (ref 150–400)
RBC: 4.36 MIL/uL (ref 3.87–5.11)
RDW: 12.8 % (ref 11.5–15.5)
WBC Count: 6.8 K/uL (ref 4.0–10.5)
nRBC: 0 % (ref 0.0–0.2)

## 2024-08-10 LAB — FERRITIN: Ferritin: 10 ng/mL — ABNORMAL LOW (ref 11–307)

## 2024-08-10 NOTE — Progress Notes (Signed)
  [facility] OFFICE PROGRESS NOTE   Diagnosis: Iron  deficiency anemia, von Willebrand disease  INTERVAL HISTORY:   Kendra Patton turns as scheduled.  She reports persistent menorrhagia.  She has bleeding between cycles.  This has occurred for the past 3 years.  She reports undergoing an extensive evaluation by gynecology without an explanation for bleeding.  She also has chronic right lower abdominal pain and reports undergoing a negative evaluation including imaging.  She is scheduled to see gynecology next month to discuss options for treating the vaginal bleeding.  She has easy bruising.  No other bleeding.  She had hand/foot virus 2 weeks ago.  This was followed by an upper respiratory infection.  She completed a course of azithromycin  with improvement.  Objective:  Vital signs in last 24 hours:  Blood pressure 127/86, pulse 98, temperature 98.1 F (36.7 C), temperature source Temporal, height 5' 7 (1.702 m), weight 223 lb 1.6 oz (101.2 kg), SpO2 100%.    Resp: Diffuse bilateral end inspiratory rhonchi/wheeze, no respiratory distress Cardio: Regular rate and rhythm GI: No hepatosplenomegaly, no mass, mild tenderness in the right lower abdomen Vascular: No leg edema  Skin: Few small ecchymoses over the extremities  Lab Results:  Lab Results  Component Value Date   WBC 6.8 08/10/2024   HGB 12.2 08/10/2024   HCT 36.8 08/10/2024   MCV 84.4 08/10/2024   PLT 389 08/10/2024   NEUTROABS 3.5 08/10/2024    CMP  Lab Results  Component Value Date   NA 138 04/01/2024   K 3.8 04/01/2024   CL 104 04/01/2024   CO2 26 04/01/2024   GLUCOSE 95 04/01/2024   BUN 7 04/01/2024   CREATININE 0.56 04/01/2024   CALCIUM 8.7 (L) 04/01/2024   PROT 7.0 04/01/2024   ALBUMIN 4.3 04/01/2024   AST 16 04/01/2024   ALT 21 04/01/2024   ALKPHOS 54 04/01/2024   BILITOT 0.3 04/01/2024   GFRNONAA >60 04/01/2024     Medications: I have reviewed the patient's current  medications.   Assessment/Plan:  Iron  deficiency anemia 08/27/2022 ferritin 4 Feraheme 510 mg 09/29/2022 and 10/06/2022 07/04/2023 hemoglobin 11.1, MCV 80, ferritin 5 Venofer  300 mg 07/13/2023 and 07/20/2023 03/07/2024 hemoglobin 10.8, MCV 77 Venofer  300 mg 03/16/2024, 03/23/2024, 03/30/2024 Menorrhagia  Von Willebrand's type I Allergic asthma ADHD Insomnia   Disposition: Kendra. Kendra Patton has a history of iron  deficiency anemia secondary to menstrual blood loss.  The hemoglobin is normal and the ferritin is at the low end of the normal range today.  She will call for symptoms of anemia.  We will administer IV iron  as indicated.  She has ongoing menorrhagia.  She is scheduled to see gynecology next month to discuss management options.  I am available to see her as needed if a surgical procedure is planned.  We will repeat a von Willebrand panel when she returns for labs in 2 months.  We can consider a trial of nasal DDAVP if there is no hormonal or planned surgical intervention for the menorrhagia.  She will be scheduled for a lab visit in 2 months and an office visit in 4 months.  Arley Hof, MD  08/10/2024  8:59 AM

## 2024-08-10 NOTE — Addendum Note (Signed)
 Addended by: STEVA DEVERE CROME on: 08/10/2024 09:51 AM   Modules accepted: Orders

## 2024-08-15 ENCOUNTER — Other Ambulatory Visit: Payer: Self-pay

## 2024-08-15 ENCOUNTER — Other Ambulatory Visit (HOSPITAL_BASED_OUTPATIENT_CLINIC_OR_DEPARTMENT_OTHER): Payer: Self-pay

## 2024-08-17 ENCOUNTER — Ambulatory Visit

## 2024-08-24 ENCOUNTER — Encounter (HOSPITAL_BASED_OUTPATIENT_CLINIC_OR_DEPARTMENT_OTHER): Payer: Self-pay

## 2024-08-24 ENCOUNTER — Other Ambulatory Visit: Payer: Self-pay

## 2024-08-24 ENCOUNTER — Encounter: Payer: Self-pay | Admitting: Nurse Practitioner

## 2024-08-24 ENCOUNTER — Other Ambulatory Visit (HOSPITAL_BASED_OUTPATIENT_CLINIC_OR_DEPARTMENT_OTHER): Payer: Self-pay

## 2024-08-24 ENCOUNTER — Ambulatory Visit

## 2024-08-24 DIAGNOSIS — R0981 Nasal congestion: Secondary | ICD-10-CM | POA: Insufficient documentation

## 2024-08-24 DIAGNOSIS — M7918 Myalgia, other site: Secondary | ICD-10-CM | POA: Insufficient documentation

## 2024-08-24 DIAGNOSIS — Z5321 Procedure and treatment not carried out due to patient leaving prior to being seen by health care provider: Secondary | ICD-10-CM | POA: Insufficient documentation

## 2024-08-24 DIAGNOSIS — R110 Nausea: Secondary | ICD-10-CM | POA: Diagnosis not present

## 2024-08-24 DIAGNOSIS — R509 Fever, unspecified: Secondary | ICD-10-CM | POA: Insufficient documentation

## 2024-08-24 DIAGNOSIS — R519 Headache, unspecified: Secondary | ICD-10-CM | POA: Insufficient documentation

## 2024-08-24 LAB — RESP PANEL BY RT-PCR (RSV, FLU A&B, COVID)  RVPGX2
Influenza A by PCR: NEGATIVE
Influenza B by PCR: NEGATIVE
Resp Syncytial Virus by PCR: NEGATIVE
SARS Coronavirus 2 by RT PCR: POSITIVE — AB

## 2024-08-24 LAB — BASIC METABOLIC PANEL WITH GFR
Anion gap: 14 (ref 5–15)
BUN: 10 mg/dL (ref 6–20)
CO2: 20 mmol/L — ABNORMAL LOW (ref 22–32)
Calcium: 9.2 mg/dL (ref 8.9–10.3)
Chloride: 101 mmol/L (ref 98–111)
Creatinine, Ser: 0.7 mg/dL (ref 0.44–1.00)
GFR, Estimated: >60 mL/min (ref >60)
Glucose, Bld: 101 mg/dL — ABNORMAL HIGH (ref 70–99)
Potassium: 4.5 mmol/L (ref 3.5–5.1)
Sodium: 135 mmol/L (ref 135–145)

## 2024-08-24 LAB — CBC
HCT: 33.9 % — ABNORMAL LOW (ref 36.0–46.0)
Hemoglobin: 11.2 g/dL — ABNORMAL LOW (ref 12.0–15.0)
MCH: 27.5 pg (ref 26.0–34.0)
MCHC: 33 g/dL (ref 30.0–36.0)
MCV: 83.1 fL (ref 80.0–100.0)
Platelets: 307 K/uL (ref 150–400)
RBC: 4.08 MIL/uL (ref 3.87–5.11)
RDW: 13.2 % (ref 11.5–15.5)
WBC: 4.5 K/uL (ref 4.0–10.5)
nRBC: 0 % (ref 0.0–0.2)

## 2024-08-24 LAB — GROUP A STREP BY PCR: Group A Strep by PCR: NOT DETECTED

## 2024-08-24 MED ORDER — HYDROCODONE BIT-HOMATROP MBR 5-1.5 MG/5ML PO SOLN
5.0000 mL | ORAL | 0 refills | Status: AC
  Filled 2024-08-24: qty 120, 7d supply, fill #0

## 2024-08-24 MED ORDER — IBUPROFEN 800 MG PO TABS
800.0000 mg | ORAL_TABLET | Freq: Once | ORAL | Status: AC
  Administered 2024-08-24: 800 mg via ORAL
  Filled 2024-08-24: qty 1

## 2024-08-24 MED ORDER — ACETAMINOPHEN 325 MG PO TABS
650.0000 mg | ORAL_TABLET | Freq: Once | ORAL | Status: AC | PRN
  Administered 2024-08-24: 650 mg via ORAL
  Filled 2024-08-24: qty 2

## 2024-08-24 MED ORDER — METHYLPREDNISOLONE 4 MG PO TBPK
ORAL_TABLET | ORAL | 0 refills | Status: DC
  Filled 2024-08-24: qty 21, 6d supply, fill #0

## 2024-08-24 NOTE — ED Triage Notes (Signed)
 Pt states that she has been having headaches since last night and then began having fevers today, with body aches, congestion, nausea, went to UC, tested negative for covid

## 2024-08-25 ENCOUNTER — Emergency Department (HOSPITAL_BASED_OUTPATIENT_CLINIC_OR_DEPARTMENT_OTHER): Admission: EM | Admit: 2024-08-25 | Discharge: 2024-08-25 | Disposition: A | Discharge status: Against Medical Advice

## 2024-08-26 ENCOUNTER — Encounter: Payer: Self-pay | Admitting: Oncology

## 2024-08-27 NOTE — Telephone Encounter (Signed)
 Note to MD.

## 2024-08-29 ENCOUNTER — Other Ambulatory Visit: Payer: Self-pay | Admitting: *Deleted

## 2024-08-29 DIAGNOSIS — D509 Iron deficiency anemia, unspecified: Secondary | ICD-10-CM

## 2024-08-29 NOTE — Progress Notes (Signed)
 Scheduling message sent for lab on ~ 10/01

## 2024-09-05 ENCOUNTER — Other Ambulatory Visit: Payer: Self-pay | Admitting: Cardiology

## 2024-09-10 ENCOUNTER — Encounter: Payer: Self-pay | Admitting: Nurse Practitioner

## 2024-09-11 ENCOUNTER — Other Ambulatory Visit (HOSPITAL_BASED_OUTPATIENT_CLINIC_OR_DEPARTMENT_OTHER): Payer: Self-pay

## 2024-09-11 ENCOUNTER — Other Ambulatory Visit: Payer: Self-pay

## 2024-09-11 ENCOUNTER — Telehealth: Payer: Self-pay | Admitting: Nurse Practitioner

## 2024-09-11 DIAGNOSIS — D500 Iron deficiency anemia secondary to blood loss (chronic): Secondary | ICD-10-CM

## 2024-09-11 MED ORDER — AMPHETAMINE-DEXTROAMPHETAMINE 20 MG PO TABS
40.0000 mg | ORAL_TABLET | Freq: Every morning | ORAL | 0 refills | Status: DC
  Filled 2024-09-16: qty 60, 30d supply, fill #0

## 2024-09-11 MED ORDER — AMPHETAMINE-DEXTROAMPHETAMINE 20 MG PO TABS
20.0000 mg | ORAL_TABLET | Freq: Two times a day (BID) | ORAL | 0 refills | Status: DC
  Filled 2024-10-18: qty 60, 30d supply, fill #0

## 2024-09-11 MED ORDER — AMPHETAMINE-DEXTROAMPHETAMINE 20 MG PO TABS
40.0000 mg | ORAL_TABLET | Freq: Every morning | ORAL | 0 refills | Status: DC
  Filled 2024-11-17: qty 60, 30d supply, fill #0

## 2024-09-11 NOTE — Telephone Encounter (Signed)
 Called PT to update about appt needed. Left detailed message on PT phone.

## 2024-09-12 ENCOUNTER — Inpatient Hospital Stay: Attending: Nurse Practitioner

## 2024-09-16 ENCOUNTER — Other Ambulatory Visit (HOSPITAL_BASED_OUTPATIENT_CLINIC_OR_DEPARTMENT_OTHER): Payer: Self-pay

## 2024-09-17 ENCOUNTER — Other Ambulatory Visit (HOSPITAL_BASED_OUTPATIENT_CLINIC_OR_DEPARTMENT_OTHER): Payer: Self-pay

## 2024-09-19 ENCOUNTER — Inpatient Hospital Stay: Attending: Nurse Practitioner

## 2024-09-19 DIAGNOSIS — J45909 Unspecified asthma, uncomplicated: Secondary | ICD-10-CM | POA: Insufficient documentation

## 2024-09-19 DIAGNOSIS — L509 Urticaria, unspecified: Secondary | ICD-10-CM | POA: Insufficient documentation

## 2024-09-19 DIAGNOSIS — F909 Attention-deficit hyperactivity disorder, unspecified type: Secondary | ICD-10-CM | POA: Diagnosis not present

## 2024-09-19 DIAGNOSIS — G4700 Insomnia, unspecified: Secondary | ICD-10-CM | POA: Insufficient documentation

## 2024-09-19 DIAGNOSIS — N920 Excessive and frequent menstruation with regular cycle: Secondary | ICD-10-CM | POA: Diagnosis present

## 2024-09-19 DIAGNOSIS — D6801 Von willebrand disease, type 1: Secondary | ICD-10-CM | POA: Insufficient documentation

## 2024-09-19 DIAGNOSIS — D500 Iron deficiency anemia secondary to blood loss (chronic): Secondary | ICD-10-CM | POA: Diagnosis present

## 2024-09-19 DIAGNOSIS — Z79899 Other long term (current) drug therapy: Secondary | ICD-10-CM | POA: Diagnosis not present

## 2024-09-19 DIAGNOSIS — D509 Iron deficiency anemia, unspecified: Secondary | ICD-10-CM

## 2024-09-19 LAB — CBC WITH DIFFERENTIAL (CANCER CENTER ONLY)
Abs Immature Granulocytes: 0.02 K/uL (ref 0.00–0.07)
Basophils Absolute: 0.1 K/uL (ref 0.0–0.1)
Basophils Relative: 1 %
Eosinophils Absolute: 0.2 K/uL (ref 0.0–0.5)
Eosinophils Relative: 2 %
HCT: 36.5 % (ref 36.0–46.0)
Hemoglobin: 12 g/dL (ref 12.0–15.0)
Immature Granulocytes: 0 %
Lymphocytes Relative: 34 %
Lymphs Abs: 2.4 K/uL (ref 0.7–4.0)
MCH: 26.9 pg (ref 26.0–34.0)
MCHC: 32.9 g/dL (ref 30.0–36.0)
MCV: 81.8 fL (ref 80.0–100.0)
Monocytes Absolute: 0.5 K/uL (ref 0.1–1.0)
Monocytes Relative: 6 %
Neutro Abs: 4.1 K/uL (ref 1.7–7.7)
Neutrophils Relative %: 57 %
Platelet Count: 370 K/uL (ref 150–400)
RBC: 4.46 MIL/uL (ref 3.87–5.11)
RDW: 14.3 % (ref 11.5–15.5)
WBC Count: 7.2 K/uL (ref 4.0–10.5)
nRBC: 0 % (ref 0.0–0.2)

## 2024-09-19 LAB — FERRITIN: Ferritin: 9 ng/mL — ABNORMAL LOW (ref 11–307)

## 2024-09-20 ENCOUNTER — Telehealth: Payer: Self-pay | Admitting: Oncology

## 2024-09-20 NOTE — Telephone Encounter (Signed)
 Called PT to schedule iron  infusions, PT has questions about health insurance coverage, let PT know that I will reach out to someone to get that situated for her.

## 2024-09-21 ENCOUNTER — Telehealth: Payer: Self-pay | Admitting: Oncology

## 2024-09-21 ENCOUNTER — Encounter: Payer: Self-pay | Admitting: *Deleted

## 2024-09-21 ENCOUNTER — Other Ambulatory Visit: Payer: Self-pay | Admitting: Nurse Practitioner

## 2024-09-21 NOTE — Telephone Encounter (Signed)
 Spoke with pt, updated appt time and day confirmed.

## 2024-09-21 NOTE — Progress Notes (Unsigned)
 Notified scheduler via scheduling message that the 01/2025 lab/OV needs to be 11/2024,

## 2024-09-21 NOTE — Telephone Encounter (Signed)
 Left detailed message on PT phone about new appt time and day.

## 2024-09-25 ENCOUNTER — Ambulatory Visit

## 2024-09-26 ENCOUNTER — Ambulatory Visit

## 2024-09-28 ENCOUNTER — Inpatient Hospital Stay

## 2024-09-28 VITALS — BP 115/54 | HR 80 | Temp 98.5°F | Resp 18 | Wt 223.5 lb

## 2024-09-28 DIAGNOSIS — D500 Iron deficiency anemia secondary to blood loss (chronic): Secondary | ICD-10-CM

## 2024-09-28 MED ORDER — SODIUM CHLORIDE 0.9 % IV SOLN: INTRAVENOUS | Status: DC

## 2024-09-28 MED ORDER — IRON SUCROSE 300 MG IVPB - SIMPLE MED
300.0000 mg | Freq: Once | Status: AC
  Administered 2024-09-28: 300 mg via INTRAVENOUS
  Filled 2024-09-28: qty 200

## 2024-09-28 NOTE — Progress Notes (Signed)
 Patient refused 30 minute observation. Vital signs stable upon discharge.

## 2024-09-28 NOTE — Patient Instructions (Signed)

## 2024-10-02 ENCOUNTER — Telehealth: Payer: Self-pay

## 2024-10-02 NOTE — Telephone Encounter (Signed)
 Left patient a message that appointment has been moved from 2 pm to 1 pm on 10/03/2024.

## 2024-10-03 ENCOUNTER — Inpatient Hospital Stay

## 2024-10-05 ENCOUNTER — Telehealth: Payer: Self-pay

## 2024-10-05 NOTE — Telephone Encounter (Signed)
-----   Message from Olam Ned sent at 09/24/2024  4:59 PM EDT ----- Please verify with Rexene.  A different iron  preparation could be given in 2 doses.  We need to confirm with her insurance company.  Thanks ----- Message ----- From: Marikay Garen RAMAN, LPN Sent: 89/02/7973   4:16 PM EDT To: Olam MARLA Ned, NP  She told Amber her insurance was only going to pay for 2 infusion. I am not sure if she had to pay out her pocket for the third infusion the last time ----- Message ----- From: Ned Olam MARLA, NP Sent: 09/24/2024   9:51 AM EDT To: Garen RAMAN Marikay, LPN  Is that new?  She got 3 last time ----- Message ----- From: Marikay Garen RAMAN, LPN Sent: 89/02/7973   9:00 AM EDT To: Olam MARLA Ned, NP  She was schedule only for 2 infusion b/c her insurance only allowed 2 at a time. ----- Message ----- From: Ned Olam MARLA, NP Sent: 09/21/2024   9:38 AM EDT To: Garen RAMAN Marikay, LPN  Should be weekly x 3 so will another one added on 10/22 ----- Message ----- From: Marikay Garen RAMAN, LPN Sent: 89/06/7973   4:27 PM EDT To: Olam MARLA Ned, NP  She is schedule on 8th and the 15th ----- Message ----- From: Ned Olam MARLA, NP Sent: 09/20/2024   8:22 AM EDT To: [facility]  She has recurrent iron  deficiency.  Please let me know when she will be coming in for IV iron  and I will enter the orders.  Thanks

## 2024-10-05 NOTE — Telephone Encounter (Signed)
 The patient is scheduled to be at [facility] on October 25th. A message has been sent to the scheduler.

## 2024-10-06 ENCOUNTER — Inpatient Hospital Stay

## 2024-10-06 VITALS — BP 127/81 | HR 79 | Temp 98.4°F | Resp 17

## 2024-10-06 DIAGNOSIS — D500 Iron deficiency anemia secondary to blood loss (chronic): Secondary | ICD-10-CM

## 2024-10-06 MED ORDER — SODIUM CHLORIDE 0.9 % IV SOLN: INTRAVENOUS | Status: DC

## 2024-10-06 MED ORDER — IRON SUCROSE 300 MG IVPB - SIMPLE MED
300.0000 mg | Freq: Once | Status: AC
  Administered 2024-10-06: 300 mg via INTRAVENOUS
  Filled 2024-10-06: qty 300

## 2024-10-06 NOTE — Patient Instructions (Signed)

## 2024-10-09 ENCOUNTER — Other Ambulatory Visit: Payer: Self-pay | Admitting: Physician Assistant

## 2024-10-09 ENCOUNTER — Telehealth: Payer: Self-pay | Admitting: Oncology

## 2024-10-09 NOTE — Telephone Encounter (Signed)
 Returning PT phone call.

## 2024-10-10 ENCOUNTER — Inpatient Hospital Stay

## 2024-10-10 ENCOUNTER — Telehealth: Payer: Self-pay | Admitting: Oncology

## 2024-10-10 NOTE — Telephone Encounter (Signed)
 Called PT yesterday to reschedule and left a voicemail. Called PT today to reschedule. Lab rescheduled due to work schedule.

## 2024-10-11 ENCOUNTER — Inpatient Hospital Stay (HOSPITAL_BASED_OUTPATIENT_CLINIC_OR_DEPARTMENT_OTHER): Admitting: Nurse Practitioner

## 2024-10-11 ENCOUNTER — Other Ambulatory Visit: Payer: Self-pay

## 2024-10-11 ENCOUNTER — Other Ambulatory Visit (HOSPITAL_BASED_OUTPATIENT_CLINIC_OR_DEPARTMENT_OTHER): Payer: Self-pay

## 2024-10-11 ENCOUNTER — Telehealth: Payer: Self-pay | Admitting: *Deleted

## 2024-10-11 ENCOUNTER — Encounter: Payer: Self-pay | Admitting: Nurse Practitioner

## 2024-10-11 ENCOUNTER — Ambulatory Visit: Payer: Self-pay

## 2024-10-11 ENCOUNTER — Inpatient Hospital Stay

## 2024-10-11 VITALS — BP 132/86 | HR 100 | Temp 98.0°F | Resp 18 | Ht 67.0 in | Wt 225.5 lb

## 2024-10-11 DIAGNOSIS — L509 Urticaria, unspecified: Secondary | ICD-10-CM

## 2024-10-11 DIAGNOSIS — D500 Iron deficiency anemia secondary to blood loss (chronic): Secondary | ICD-10-CM | POA: Diagnosis not present

## 2024-10-11 MED ORDER — METHYLPREDNISOLONE 4 MG PO TBPK
ORAL_TABLET | ORAL | 0 refills | Status: DC
  Filled 2024-10-11: qty 21, 6d supply, fill #0

## 2024-10-11 MED ORDER — METHYLPREDNISOLONE SODIUM SUCC 125 MG IJ SOLR
125.0000 mg | Freq: Once | INTRAMUSCULAR | Status: AC
  Administered 2024-10-11: 125 mg via INTRAMUSCULAR
  Filled 2024-10-11: qty 2

## 2024-10-11 NOTE — Telephone Encounter (Signed)
 FYI Only or Action Required?: FYI only for provider. Nurse recommended pt go to ED/call 911: pt verbalized understanding - stated going now  Patient was last seen in primary care on 11/02/2023 by Allwardt, Alyssa M, PA-C.  Called Nurse Triage reporting Urticaria.  Symptoms began yesterday.  Interventions attempted: OTC medications: benadryl .  Symptoms are: gradually worsening.  Triage Disposition: Call EMS 911 Now  Patient/caregiver understands and will follow disposition?: Yes    Copied from CRM [id number]. Topic: Clinical - Red Word Triage >> Oct 11, 2024 10:07 AM Dedra NOVAK wrote: Red Word that prompted transfer to Nurse Triage: Pt experiencing severe hives on entire body. She took Benadryl  but it didn't help. Warm transfer to NT. Reason for Disposition  [1] Life-threatening reaction (anaphylaxis) in the past to similar substance (e.g., food, insect bite/sting, medication) AND [2] < 2 hours since exposure  Answer Assessment - Initial Assessment Questions 1. APPEARANCE: What does the rash look like?      Swollen, red, huge raised whelps 1-2 diameter 2. LOCATION: Where is the rash located?      All of entire 3. NUMBER: How many hives are there?      Too many 4. SIZE: How big are the hives? (e.g., inches, cm, compare to coins) Do they all look the same or do they vary in shape and size?      various 5. ONSET: When did the hives begin? (e.g., hours or days ago)      yesterday 6. ITCHING: Does it itch? If Yes, ask: How bad is the itch?  (e.g., none, mild, moderate, severe)     Moderate 7. RECURRENT PROBLEM: Have you had hives before? If Yes, ask: When was the last time? and What happened that time?      no 8. TRIGGERS: Were you exposed to any new food, plant, cosmetic product or animal just before the hives began?     no 9. OTHER SYMPTOMS: Do you have any other symptoms? (e.g., fever, tongue swelling, difficulty breathing, abdomen pain)     Hives Hot to  touch, SOB at times, tickle cough, congestion, abd pain/, bilateral ear itching 10. PREGNANCY: Is there any chance you are pregnant? When was your last menstrual period?       na  Iron  infusion on Sunday.  Had heart burn/chest pain yesterday and today feels a little better today & heaviness, left arm & shoulder 5/10 and yesterday 8/10,  Protocols used: Hives-A-AH

## 2024-10-11 NOTE — Patient Instructions (Signed)
 Prednisolone Solution What is this medication? PREDNISOLONE (pred NISS oh lone) treats many conditions such as asthma, allergic reactions, arthritis, inflammatory bowel diseases, adrenal, and blood or bone marrow disorders. It works by decreasing inflammation, slowing down an overactive immune system, or replacing cortisol normally made in the body. Cortisol is a hormone that plays an important role in how the body responds to stress, illness, and injury. It belongs to a group of medications called steroids. This medicine may be used for other purposes; ask your health care provider or pharmacist if you have questions. COMMON BRAND NAME(S): AsmalPred, Millipred, Orapred, Pediapred, Prelone, Veripred-20 What should I tell my care team before I take this medication? They need to know if you have any of these conditions: Cushing syndrome Diabetes Glaucoma Heart disease High blood pressure Infection, such as herpes, measles, tuberculosis, chickenpox Kidney disease Mental health conditions Myasthenia gravis Osteoporosis, weak bones Recent or upcoming vaccine Stomach ulcers, other stomach or intestine problems Thyroid  disease An unusual or allergic reaction to prednisolone, other medications, foods, dyes, or preservatives Pregnant or trying to get pregnant Breastfeeding How should I use this medication? Take this medication by mouth. Use a specially marked spoon or dropper to measure your dose. Ask your pharmacist if you do not have one. Household spoons are not accurate. Take with food or milk to avoid stomach upset. If you are taking this medication once a day, take it in the morning. Do not take it more often than directed. Do not suddenly stop taking your medication because you may develop a severe reaction. Your care team will tell you how much medication to take. If your care team wants you to stop the medication, the dose may be slowly lowered over time to avoid any side effects. Talk to  your care team about the use of this medication in children. Special care may be needed. Overdosage: If you think you have taken too much of this medicine contact a poison control center or emergency room at once. NOTE: This medicine is only for you. Do not share this medicine with others. What if I miss a dose? If you miss a dose, take it as soon as you can. If it is almost time for your next dose, take only that dose. Do not take double or extra doses. What may interact with this medication? Do not take this medication with any of the following: Metyrapone This medication may also interact with the following: Amphotericin B Aspirin and aspirin-like medications Cholestyramine Cyclosporine Digoxin Diuretics Estrogen and progestin hormones Ketoconazole Medications for diabetes, such as insulin, glipizide, glyburide NSAIDS, medications for pain and inflammation, such as ibuprofen  or naproxen Phenytoin Rifampin Vaccines Warfarin This list may not describe all possible interactions. Give your health care provider a list of all the medicines, herbs, non-prescription drugs, or dietary supplements you use. Also tell them if you smoke, drink alcohol, or use illegal drugs. Some items may interact with your medicine. What should I watch for while using this medication? Visit your care team for regular checks on your progress. If you are taking this medication over a prolonged period, carry an identification card with your name and address, the type and dose of your medication, and your care team's name and address. This medication may increase your risk of getting an infection. Tell your care team if you are around anyone with measles or chickenpox, or if you develop sores or blisters that do not heal properly. If you are going to have surgery, tell your  care team that you have taken this medication within the last twelve months. Ask your care team about your diet. You may need to lower the amount  of salt you eat. This medication may increase blood sugar. Ask your care team if changes in diet or medications are needed if you have diabetes. What side effects may I notice from receiving this medication? Side effects that you should report to your care team as soon as possible: Allergic reactions--skin rash, itching, hives, swelling of the face, lips, tongue, or throat Cushing syndrome--increased fat around the midsection, upper back, neck, or face, pink or purple stretch marks on the skin, thinning, fragile skin that easily bruises, unexpected hair growth High blood sugar (hyperglycemia)--increased thirst or amount of urine, unusual weakness or fatigue, blurry vision Increase in blood pressure Infection--fever, chills, cough, sore throat, wounds that don't heal, pain or trouble when passing urine, general feeling of discomfort or being unwell Low adrenal gland function--nausea, vomiting, loss of appetite, unusual weakness or fatigue, dizziness Mood and behavior changes--anxiety, nervousness, confusion, hallucinations, irritability, hostility, thoughts of suicide or self-harm, worsening mood, feelings of depression Stomach bleeding--bloody or black, tar-like stools, vomiting blood or brown material that looks like coffee grounds Swelling of the ankles, hands, or feet Side effects that usually do not require medical attention (report to your care team if they continue or are bothersome): Acne General discomfort and fatigue Headache Increase in appetite Nausea Trouble sleeping Weight gain This list may not describe all possible side effects. Call your doctor for medical advice about side effects. You may report side effects to FDA at 1-800-FDA-1088. Where should I keep my medication? Keep out of the reach of children. You will be instructed on how to store this medication. See product for storage instructions. Each product may have different instructions. Most solutions or syrups are stored  between 4 and 25 degrees C (39 and 77 degrees F). Keep tightly closed. Many products may be refrigerated. Do not freeze. Throw away any unused medication after the expiration date. NOTE: This sheet is a summary. It may not cover all possible information. If you have questions about this medicine, talk to your doctor, pharmacist, or health care provider.  2024 Elsevier/Gold Standard (2023-05-13 00:00:00)

## 2024-10-11 NOTE — Telephone Encounter (Signed)
 Called Ayisha to come to be seen by NP here at 1130 today. She is on the way.

## 2024-10-11 NOTE — Telephone Encounter (Addendum)
 Had iron  infusion on 10/18 and went well. On 10/22 developed itching and hives on body. Took ES Benadryl  several doses. Today she woke up with entire body covered w/hives and itching all over. Also reports her lips are swollen and face is somewhat. Denies any trouble swallowing or shortness of breath. Asking if this is a delayed reaction to the iron ? Has had no changes in meds, detergents or any know environmental exposure. Can she be seen today (has to go to work at 2 pm today).

## 2024-10-11 NOTE — Telephone Encounter (Signed)
 See pt triage note, advised to go to ED and pt agreeable

## 2024-10-11 NOTE — Progress Notes (Signed)
 Pt waited 20 minutes post injection in infusion suit. VS taken after 20 minutes. Lacie Burton, NP came to see pt after 20 minute wait period and gave the okay to d/c pt.

## 2024-10-11 NOTE — Progress Notes (Signed)
 [facility]   [phone] [phone]    Patient Care Team: Allwardt, Mardy HERO, PA-C as PCP - General (Physician Assistant) Darcel Pool, MD as Consulting Physician (Obstetrics and Gynecology)   CHIEF COMPLAINT: Symptom management visit for hives  CURRENT THERAPY: Recent IV Venofer  09/28/24 and 10/06/2024  INTERVAL HISTORY Kendra Patton presents for symptom management visit. She developed recurrent iron  deficiency anemia and received IV venofer  300 mg 10/10 and 10/18. She has received this in the past without difficulty.  On 10/21 she developed left shoulder pain, on 10/22 she had an episode of severe heartburn and took Tums, later at work she developed itching in her ears and scalp. By end of the day she had head to toe hives. Took multiple doses of benadryl  without much relief. Today she has scattered hives and it very itchy. Throat was itchy as well and took inhaler twice last night for wheezing. Denies environmental exposures or sick contacts.   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   amphetamine -dextroamphetamine  (ADDERALL) 20 MG tablet Take 1 tablet by mouth twice daily   amphetamine -dextroamphetamine  (ADDERALL) 20 MG tablet Take 2  tablets by mouth every morning for focus and attention. (Patient taking differently: Take 20 mg by mouth 2 (two) times daily.)   [START ON 11/12/2024] amphetamine -dextroamphetamine  (ADDERALL) 20 MG tablet Take 2 tablets (40 mg total) by mouth every morning for focus and attention.   amphetamine -dextroamphetamine  (ADDERALL) 20 MG tablet Take 2 tablets (40 mg total) by mouth every morning for focus and attention.   [START ON 10/13/2024] amphetamine -dextroamphetamine  (ADDERALL) 20 MG tablet Take 1 tablet (20 mg total) by mouth 2 (two) times daily.   apixaban (ELIQUIS) 2.5 MG TABS tablet Take 5 mg by mouth 2 (two) times daily.   azithromycin  (ZITHROMAX ) 250 MG tablet Take 250 mg by mouth daily. Taper   hydrOXYzine  (ATARAX ) 25 MG tablet Take 1-2 tablets by mouth at bedtime as needed for sleep   ibuprofen  (ADVIL ) 200 MG tablet Take 200 mg by mouth every 6 (six) hours as needed.   methylPREDNISolone  (MEDROL ) 4 MG TBPK tablet Take 6 Tablet (oral) daily for 1 days then take 5 Tablet (oral) daily for 1 days then take 4 Tablet (oral) daily for 1 days then take 3 Tablet (oral) daily for 1 days then take 2 Tablet (oral) daily for 1 days then take 1 Tablet (oral) daily for 1 days   metoprolol  tartrate (LOPRESSOR ) 25 MG tablet TAKE 1 TABLET BY MOUTH TWICE A DAY   azelastine  (ASTELIN ) 0.1 % nasal spray Place 2 sprays into both nostrils 2 (two) times  daily. (Patient not taking: Reported on 10/11/2024)   EPINEPHrine  (AUVI-Q ) 0.3 mg/0.3 mL IJ SOAJ injection Inject 0.3 mg into the muscle as needed for anaphylaxis. (Patient not taking: Reported on 10/11/2024)   lidocaine  (XYLOCAINE ) 2 % solution Swish, gargle, and spit 15 mLs in the mouth or throat 2 to 4 (four) times daily. (Patient not taking: Reported on 10/11/2024)   triamcinolone  ointment (KENALOG ) 0.5 % Apply 1 Application topically 2 (two) times daily for itching. (Patient not taking: Reported on 10/11/2024)   No facility-administered encounter medications on file  as of 10/11/2024.     Today's Vitals   10/11/24 1100 10/11/24 1130 10/11/24 1131  BP:  (!) 128/99 132/86  Pulse:  100   Resp:  18   Temp:  98 F (36.7 C)   TempSrc:  Temporal   SpO2:  100%   Weight:  225 lb 8 oz (102.3 kg)   Height:  5' 7 (1.702 m)   PainSc: 0-No pain     Body mass index is 35.32 kg/m.    PHYSICAL EXAM GENERAL:alert, no distress and comfortable SKIN: diffuse rash with scattered hives behind the ear, buttock, posterior left forearm and bilateral lower legs   EYES: sclera clear LUNGS: clear with normal breathing effort HEART: regular rate & rhythm ABDOMEN: abdomen soft, non-tender and normal bowel sounds NEURO: alert & oriented x 3 with fluent speech, no focal motor/sensory deficits   CBC    Latest Ref Rng & Units 09/19/2024   12:59 PM 08/24/2024    7:43 PM 08/10/2024    8:10 AM  CBC  WBC 4.0 - 10.5 K/uL 7.2  4.5  6.8   Hemoglobin 12.0 - 15.0 g/dL 87.9  88.7  87.7   Hematocrit 36.0 - 46.0 % 36.5  33.9  36.8   Platelets 150 - 400 K/uL 370  307  389       CMP     Latest Ref Rng & Units 08/24/2024    8:20 PM 04/01/2024    8:30 PM 06/21/2023    1:17 PM  CMP  Glucose 70 - 99 mg/dL 898  95  96   BUN 6 - 20 mg/dL 10  7  9    Creatinine 0.44 - 1.00 mg/dL 9.29  9.43  9.37   Sodium 135 - 145 mmol/L 135  138  136   Potassium 3.5 - 5.1 mmol/L 4.5  3.8  4.4   Chloride 98 - 111 mmol/L 101  104  102   CO2 22 - 32 mmol/L 20  26  24    Calcium 8.9 - 10.3 mg/dL 9.2  8.7  9.6   Total Protein 6.5 - 8.1 g/dL  7.0  7.0   Total Bilirubin 0.0 - 1.2 mg/dL  0.3  0.4   Alkaline Phos 38 - 126 U/L  54  74   AST 15 - 41 U/L  16  21   ALT 0 - 44 U/L  21  24       ASSESSMENT & PLAN: Iron  deficiency anemia 08/27/2022 ferritin 4 Feraheme 510 mg 09/29/2022 and 10/06/2022 07/04/2023 hemoglobin 11.1, MCV 80, ferritin 5 Venofer  300 mg 07/13/2023 and 07/20/2023 03/07/2024 hemoglobin 10.8, MCV 77 Venofer  300 mg 03/16/2024, 03/23/2024, 03/30/2024 Menorrhagia  Von Willebrand's type  I Allergic asthma ADHD Insomnia Hives likely secondary to IV venofer     Disposition:  Kendra Patton appears stable. She presents with diffuse hives most likely secondary to recent IV venofer  infusion. She will receive IM solumedrol in clinic and  will start medrol  dose pack at home. We will cancel venofer  on 10/25. I recommend to begin oral iron , she agrees.   She will return for lab next week, then lab/follow up in 4 months as scheduled. She knows to call sooner if needed.   Case discussed with Dr. Cloretta.     All questions were answered. The patient knows to call the clinic with any problems, questions or concerns. No barriers to learning were detected.  Rehman Levinson K Sharrieff Spratlin, NP 10/11/2024

## 2024-10-13 ENCOUNTER — Inpatient Hospital Stay

## 2024-10-16 ENCOUNTER — Ambulatory Visit: Payer: Self-pay

## 2024-10-16 NOTE — Telephone Encounter (Signed)
 FYI Only or Action Required?: FYI only for provider.  Patient was last seen in primary care on 11/02/2023 by Allwardt, Mardy HERO, PA-C.  Called Nurse Triage reporting Rash.  Symptoms began today.  Interventions attempted: OTC medications: Hydrocortisone cream and Prescription medications: Prednisone , benadryl .  Symptoms are: gradually worsening.  Triage Disposition: See PCP When Office is Open (Within 3 Days)  Patient/caregiver understands and will follow disposition?: Yes  Copied from CRM #[id number]. Topic: Clinical - Red Word Triage >> Oct 16, 2024  1:35 PM Mesmerise C wrote: [facility] that prompted transfer to Nurse Triage: Patient stated she still has hives from last week seen a hematologist gave her prednisone  and told her to take benadryl , states it got better but now on her hand has this red rash stated it doesn't itch and no pain but looks like a burn Reason for Disposition  [1] Pimples (localized) AND DOMITILLA.DISTEL ] no improvement after using Care Advice  Answer Assessment - Initial Assessment Questions Pt reports she was seen last week for hives. Prescribed prednisone  and benadryl  which has improved hives, now has onset of red spots on both hands that looks different from the hives she had last week. OV scheduled for tomorrow.  1. APPEARANCE of RASH: What does the rash look like? (e.g., blisters, dry flaky skin, red spots, redness, sores)     Not raised, red round spots.  2. LOCATION: Where is the rash located?      Both hands  3. NUMBER: How many spots are there?      4 on the right hand, 1 on the left hand  4. SIZE: How big are the spots? (e.g., inches, cm; or compare to size of pinhead, tip of pen, eraser, pea)      Between pencil eraser and quarter  5. ONSET: When did the rash start?      This morning  6. ITCHING: Does the rash itch? If Yes, ask: How bad is the itch?  (Scale 0-10; or none, mild, moderate, severe)     None  7. PAIN: Does the rash hurt? If  Yes, ask: How bad is the pain?  (Scale 0-10; or none, mild, moderate, severe)     None  8. OTHER SYMPTOMS: Do you have any other symptoms? (e.g., fever)     No fever. Has post-nasal drip.  9. PREGNANCY: Is there any chance you are pregnant? When was your last menstrual period?     No. Husband had vasectomy.  Protocols used: Rash or Redness - Localized-A-AH

## 2024-10-17 ENCOUNTER — Encounter: Payer: Self-pay | Admitting: Family

## 2024-10-17 ENCOUNTER — Inpatient Hospital Stay

## 2024-10-17 ENCOUNTER — Other Ambulatory Visit (HOSPITAL_BASED_OUTPATIENT_CLINIC_OR_DEPARTMENT_OTHER): Payer: Self-pay

## 2024-10-17 ENCOUNTER — Ambulatory Visit: Admitting: Family

## 2024-10-17 ENCOUNTER — Ambulatory Visit: Payer: Self-pay | Admitting: Oncology

## 2024-10-17 VITALS — BP 110/72 | HR 89 | Temp 97.5°F | Ht 67.0 in | Wt 225.2 lb

## 2024-10-17 DIAGNOSIS — D509 Iron deficiency anemia, unspecified: Secondary | ICD-10-CM

## 2024-10-17 DIAGNOSIS — R21 Rash and other nonspecific skin eruption: Secondary | ICD-10-CM

## 2024-10-17 DIAGNOSIS — D500 Iron deficiency anemia secondary to blood loss (chronic): Secondary | ICD-10-CM | POA: Diagnosis not present

## 2024-10-17 LAB — CBC WITH DIFFERENTIAL (CANCER CENTER ONLY)
Abs Immature Granulocytes: 0.02 K/uL (ref 0.00–0.07)
Basophils Absolute: 0.1 K/uL (ref 0.0–0.1)
Basophils Relative: 1 %
Eosinophils Absolute: 0.2 K/uL (ref 0.0–0.5)
Eosinophils Relative: 3 %
HCT: 38.5 % (ref 36.0–46.0)
Hemoglobin: 12.3 g/dL (ref 12.0–15.0)
Immature Granulocytes: 0 %
Lymphocytes Relative: 39 %
Lymphs Abs: 2.3 K/uL (ref 0.7–4.0)
MCH: 27.1 pg (ref 26.0–34.0)
MCHC: 31.9 g/dL (ref 30.0–36.0)
MCV: 84.8 fL (ref 80.0–100.0)
Monocytes Absolute: 0.5 K/uL (ref 0.1–1.0)
Monocytes Relative: 8 %
Neutro Abs: 2.9 K/uL (ref 1.7–7.7)
Neutrophils Relative %: 49 %
Platelet Count: 362 K/uL (ref 150–400)
RBC: 4.54 MIL/uL (ref 3.87–5.11)
RDW: 17 % — ABNORMAL HIGH (ref 11.5–15.5)
WBC Count: 5.8 K/uL (ref 4.0–10.5)
nRBC: 0 % (ref 0.0–0.2)

## 2024-10-17 LAB — FERRITIN: Ferritin: 191 ng/mL (ref 11–307)

## 2024-10-17 NOTE — Telephone Encounter (Signed)
 Noted

## 2024-10-17 NOTE — Progress Notes (Signed)
 Patient ID: Kendra Patton, female    DOB: Jan 06, 1986, 38 y.o.   MRN: [id number]  Chief Complaint  Patient presents with   Rash    Pt c/o hives all over body on 10/22. Rash on bilateral hands, present since yesterday. Iron  infusion on 10/18.    Shoulder Pain    Pt c/o left shoulder/chest area pain, present 10/20.  Discussed the use of AI scribe software for clinical note transcription with the patient, who gave verbal consent to proceed.  History of Present Illness Kendra Patton is a 38 year old female who presents with a severe allergic reaction following an iron  infusion.  Five days post-iron  infusion, she developed severe hives and swelling, which have since subsided. Current symptoms include intermittent swelling and redness in her hands, especially with movement, and lip swelling and she is unsure if related to the iron  infusion or a new allergy. Last night she also experienced an asthma attack with coughing and a sensation of restricted breathing, accompanied by an itching sensation in her lungs and throat. She manages symptoms with Benadryl , Claritin, Pepcid, and hydroxyzine . She was seen by Hematology and given Solumedrol injection and a prednisone  pack, but she is wanting to avoid prednisone  due to dx of central serous retinopathy. Her past medical history includes asthma and eczema, managed with inhalers and topical steroids. Family history includes a niece with mast cell activation syndrome related to Ehlers-Danlos syndrome. 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  Assessment & Plan Allergic reaction to intravenous iron  infusion Severe allergic reaction with hives, swelling, and respiratory symptoms post-infusion. Possible mast cell activation syndrome considered due to family history and symptoms. Antihistamines and topical treatments used for management. - Continue Claritin twice daily. - Continue Pepcid 20 mg twice daily until all  symptoms resolved. - Use Benadryl  or hydroxyzine  at night as needed for itching. - Apply triamcinolone  0.5% ointment to hands for inflammation and swelling. - Consider referral to allergist for further evaluation and testing if symptoms persist.  Central serous chorioretinopathy Steroid use contraindicated due to risk of exacerbating condition and potential need for surgery. Risks of steroid use explained. - Adding diagnosis to problem list - Avoid systemic and topical steroids unless absolutely necessary.  Subjective:    Outpatient Medications Prior to Visit  Medication Sig Dispense Refill   albuterol  (VENTOLIN  HFA) 108 (90 Base) MCG/ACT inhaler Inhale 2 puffs into the lungs every 4 (four) hours as needed for wheezing or shortness of breath (coughing fits). 18 g 1   amphetamine -dextroamphetamine  (ADDERALL) 20 MG tablet Take 1 tablet by mouth twice daily 60 tablet 0   hydrOXYzine  (ATARAX ) 25 MG tablet Take 1-2 tablets by mouth at bedtime as needed for sleep 60 tablet 2   ibuprofen  (ADVIL ) 200 MG tablet Take 200 mg by mouth every 6 (six) hours as needed.     metoprolol  tartrate (LOPRESSOR ) 25 MG tablet TAKE 1 TABLET BY MOUTH TWICE A DAY 30 tablet 0   triamcinolone  ointment (KENALOG ) 0.5 % Apply 1 Application topically 2 (two) times daily for itching. 60 g 0   amphetamine -dextroamphetamine  (ADDERALL) 20 MG tablet Take 2 tablets by mouth every morning for focus and attention. (Patient taking differently: Take 20 mg by mouth 2 (two) times daily.) 60 tablet 0   [START ON 11/12/2024] amphetamine -dextroamphetamine  (ADDERALL) 20 MG tablet Take 2 tablets (40 mg total) by mouth every morning for focus and attention. 60 tablet 0   amphetamine -dextroamphetamine  (  ADDERALL) 20 MG tablet Take 2 tablets (40 mg total) by mouth every morning for focus and attention. 60 tablet 0   amphetamine -dextroamphetamine  (ADDERALL) 20 MG tablet Take 1 tablet (20 mg total) by mouth 2 (two) times daily. 60 tablet 0    apixaban (ELIQUIS) 2.5 MG TABS tablet Take 5 mg by mouth 2 (two) times daily.     azelastine  (ASTELIN ) 0.1 % nasal spray Place 2 sprays into both nostrils 2 (two) times daily. (Patient not taking: Reported on 10/11/2024) 60 mL 3   azithromycin  (ZITHROMAX ) 250 MG tablet Take 250 mg by mouth daily. Taper     EPINEPHrine  (AUVI-Q ) 0.3 mg/0.3 mL IJ SOAJ injection Inject 0.3 mg into the muscle as needed for anaphylaxis. (Patient not taking: Reported on 10/11/2024) 1 each 1   lidocaine  (XYLOCAINE ) 2 % solution Swish, gargle, and spit 15 mLs in the mouth or throat 2 to 4 (four) times daily. (Patient not taking: Reported on 10/11/2024) 200 mL 0   methylPREDNISolone  (MEDROL  DOSEPAK) 4 MG TBPK tablet Take as prescribed 21 tablet 0   No facility-administered medications prior to visit.   Past Medical History:  Diagnosis Date   Anxiety    age 67   Asthma    COVID 04/2021   Depression    Eczema    High-risk pregnancy supervision 10/27/2015   History of CMV    last pregnancy   History of OCD (obsessive compulsive disorder)    IBS (irritable bowel syndrome)    Scoliosis    Von Willebrand disease (HCC)    Past Surgical History:  Procedure Laterality Date   COLONOSCOPY     UPPER GASTROINTESTINAL ENDOSCOPY     WISDOM TOOTH EXTRACTION     Allergies  Allergen Reactions   Levofloxacin  Hives   Aspirin Other (See Comments)    Thins bloods too much   Compazine [Prochlorperazine Edisylate]     Dystonic.   Other     Compazine   Penicillins     Unknown reaction   Prochlorperazine     Other reaction(s): Unknown   Venofer  [Iron  Sucrose] Hives      Objective:    Physical Exam Vitals and nursing note reviewed.  Constitutional:      Appearance: Normal appearance.  Cardiovascular:     Rate and Rhythm: Normal rate and regular rhythm.  Pulmonary:     Effort: Pulmonary effort is normal.     Breath sounds: Normal breath sounds.  Musculoskeletal:        General: Normal range of motion.  Skin:     General: Skin is warm and dry.     Findings: Rash (mild, streaky erythema noted in 2 places on each dorsal hand with mild swelling) present.  Neurological:     Mental Status: She is alert.  Psychiatric:        Mood and Affect: Mood normal.        Behavior: Behavior normal.    BP 110/72 (BP Location: Left Arm, Patient Position: Sitting, Cuff Size: Normal)   Pulse 89   Temp (!) 97.5 F (36.4 C) (Temporal)   Ht 5' 7 (1.702 m)   Wt 225 lb 3.2 oz (102.2 kg)   LMP 10/15/2024 (Exact Date)   SpO2 95%   BMI 35.27 kg/m  Wt Readings from Last 3 Encounters:  10/17/24 225 lb 3.2 oz (102.2 kg)  10/11/24 225 lb 8 oz (102.3 kg)  09/28/24 223 lb 8 oz (101.4 kg)       Jaylinn Hellenbrand,  NP

## 2024-10-18 ENCOUNTER — Other Ambulatory Visit: Payer: Self-pay

## 2024-10-18 ENCOUNTER — Other Ambulatory Visit (HOSPITAL_BASED_OUTPATIENT_CLINIC_OR_DEPARTMENT_OTHER): Payer: Self-pay

## 2024-10-19 LAB — COAG STUDIES INTERP REPORT

## 2024-10-19 LAB — VON WILLEBRAND PANEL
Coagulation Factor VIII: 122 % (ref 56–140)
Ristocetin Co-factor, Plasma: 80 % (ref 50–200)
Von Willebrand Antigen, Plasma: 73 % (ref 50–200)

## 2024-10-28 ENCOUNTER — Other Ambulatory Visit: Payer: Self-pay | Admitting: Physician Assistant

## 2024-11-17 ENCOUNTER — Other Ambulatory Visit (HOSPITAL_BASED_OUTPATIENT_CLINIC_OR_DEPARTMENT_OTHER): Payer: Self-pay

## 2024-11-19 ENCOUNTER — Telehealth: Payer: Self-pay | Admitting: Physician Assistant

## 2024-11-19 ENCOUNTER — Other Ambulatory Visit (HOSPITAL_BASED_OUTPATIENT_CLINIC_OR_DEPARTMENT_OTHER): Payer: Self-pay

## 2024-11-19 NOTE — Telephone Encounter (Unsigned)
 Copied from CRM [id number]. Topic: Clinical - Medication Refill >> Nov 19, 2024 10:48 AM Corin V wrote: Medication: metoprolol  tartrate (LOPRESSOR ) 25 MG tablet Out of medication, scheduled for first available on 12/21/24  Has the patient contacted their pharmacy? Yes (Agent: If no, request that the patient contact the pharmacy for the refill. If patient does not wish to contact the pharmacy document the reason why and proceed with request.) (Agent: If yes, when and what did the pharmacy advise?)  This is the patient's preferred pharmacy:  [facility] [address] [place] [address] Phone: [phone] Fax: [phone]  Is this the correct pharmacy for this prescription? Yes If no, delete pharmacy and type the correct one.   Has the prescription been filled recently? No  Is the patient out of the medication? Yes  Has the patient been seen for an appointment in the last year OR does the patient have an upcoming appointment? Yes  Can we respond through MyChart? No  Agent: Please be advised that Rx refills may take up to 3 business days. We ask that you follow-up with your pharmacy.

## 2024-11-20 ENCOUNTER — Other Ambulatory Visit (HOSPITAL_BASED_OUTPATIENT_CLINIC_OR_DEPARTMENT_OTHER): Payer: Self-pay

## 2024-11-20 MED ORDER — METOPROLOL TARTRATE 25 MG PO TABS
25.0000 mg | ORAL_TABLET | Freq: Two times a day (BID) | ORAL | 0 refills | Status: DC
  Filled 2024-11-20: qty 30, 15d supply, fill #0

## 2024-12-07 ENCOUNTER — Other Ambulatory Visit (HOSPITAL_BASED_OUTPATIENT_CLINIC_OR_DEPARTMENT_OTHER): Payer: Self-pay

## 2024-12-07 ENCOUNTER — Other Ambulatory Visit: Payer: Self-pay

## 2024-12-07 MED ORDER — DYANAVEL XR 20 MG PO TBCR
20.0000 mg | EXTENDED_RELEASE_TABLET | Freq: Every morning | ORAL | 0 refills | Status: DC
  Filled 2024-12-07: qty 30, 30d supply, fill #0

## 2024-12-17 ENCOUNTER — Other Ambulatory Visit: Payer: Self-pay | Admitting: Physician Assistant

## 2024-12-17 ENCOUNTER — Other Ambulatory Visit (HOSPITAL_BASED_OUTPATIENT_CLINIC_OR_DEPARTMENT_OTHER): Payer: Self-pay

## 2024-12-17 MED ORDER — METOPROLOL TARTRATE 25 MG PO TABS
25.0000 mg | ORAL_TABLET | Freq: Two times a day (BID) | ORAL | 0 refills | Status: DC
  Filled 2024-12-17: qty 30, 15d supply, fill #0

## 2024-12-18 ENCOUNTER — Other Ambulatory Visit (HOSPITAL_BASED_OUTPATIENT_CLINIC_OR_DEPARTMENT_OTHER): Payer: Self-pay

## 2024-12-21 ENCOUNTER — Ambulatory Visit: Admitting: Physician Assistant

## 2025-01-08 ENCOUNTER — Other Ambulatory Visit (HOSPITAL_BASED_OUTPATIENT_CLINIC_OR_DEPARTMENT_OTHER): Payer: Self-pay

## 2025-01-08 ENCOUNTER — Other Ambulatory Visit: Payer: Self-pay

## 2025-01-08 MED ORDER — DYANAVEL XR 20 MG PO TBCR: 1.0000 | EXTENDED_RELEASE_TABLET | Freq: Every morning | ORAL | 0 refills | Status: AC

## 2025-01-08 MED ORDER — DYANAVEL XR 20 MG PO TBCR
1.0000 | EXTENDED_RELEASE_TABLET | ORAL | 0 refills | Status: AC
  Filled 2025-01-08: qty 30, 30d supply, fill #0
  Filled 2025-01-11: qty 30, fill #0
  Filled 2025-01-12: qty 30, 30d supply, fill #0

## 2025-01-11 ENCOUNTER — Other Ambulatory Visit (HOSPITAL_BASED_OUTPATIENT_CLINIC_OR_DEPARTMENT_OTHER): Payer: Self-pay

## 2025-01-12 ENCOUNTER — Other Ambulatory Visit (HOSPITAL_BASED_OUTPATIENT_CLINIC_OR_DEPARTMENT_OTHER): Payer: Self-pay

## 2025-01-14 ENCOUNTER — Other Ambulatory Visit (HOSPITAL_BASED_OUTPATIENT_CLINIC_OR_DEPARTMENT_OTHER): Payer: Self-pay

## 2025-01-14 ENCOUNTER — Other Ambulatory Visit: Payer: Self-pay

## 2025-01-14 ENCOUNTER — Other Ambulatory Visit: Payer: Self-pay | Admitting: Physician Assistant

## 2025-01-15 ENCOUNTER — Other Ambulatory Visit: Payer: Self-pay

## 2025-01-15 ENCOUNTER — Encounter: Payer: Self-pay | Admitting: Physician Assistant

## 2025-01-15 ENCOUNTER — Ambulatory Visit: Admitting: Physician Assistant

## 2025-01-15 ENCOUNTER — Other Ambulatory Visit (HOSPITAL_BASED_OUTPATIENT_CLINIC_OR_DEPARTMENT_OTHER): Payer: Self-pay

## 2025-01-15 MED ORDER — METOPROLOL TARTRATE 25 MG PO TABS
25.0000 mg | ORAL_TABLET | Freq: Two times a day (BID) | ORAL | 0 refills | Status: DC
  Filled 2025-01-15: qty 14, 7d supply, fill #0

## 2025-01-15 NOTE — Telephone Encounter (Signed)
 Please advise on refill, pt was advised last refill needed appt

## 2025-01-21 ENCOUNTER — Ambulatory Visit: Admitting: Physician Assistant

## 2025-01-21 ENCOUNTER — Other Ambulatory Visit (HOSPITAL_BASED_OUTPATIENT_CLINIC_OR_DEPARTMENT_OTHER): Payer: Self-pay

## 2025-01-21 DIAGNOSIS — R5383 Other fatigue: Secondary | ICD-10-CM

## 2025-01-21 DIAGNOSIS — R002 Palpitations: Secondary | ICD-10-CM

## 2025-01-21 DIAGNOSIS — N926 Irregular menstruation, unspecified: Secondary | ICD-10-CM | POA: Diagnosis not present

## 2025-01-21 DIAGNOSIS — Z91018 Allergy to other foods: Secondary | ICD-10-CM | POA: Diagnosis not present

## 2025-01-21 DIAGNOSIS — Z13220 Encounter for screening for lipoid disorders: Secondary | ICD-10-CM

## 2025-01-21 DIAGNOSIS — I471 Supraventricular tachycardia, unspecified: Secondary | ICD-10-CM | POA: Diagnosis not present

## 2025-01-21 DIAGNOSIS — D6801 Von willebrand disease, type 1: Secondary | ICD-10-CM

## 2025-01-21 DIAGNOSIS — Z131 Encounter for screening for diabetes mellitus: Secondary | ICD-10-CM

## 2025-01-21 DIAGNOSIS — D509 Iron deficiency anemia, unspecified: Secondary | ICD-10-CM | POA: Diagnosis not present

## 2025-01-21 DIAGNOSIS — L659 Nonscarring hair loss, unspecified: Secondary | ICD-10-CM

## 2025-01-21 DIAGNOSIS — E559 Vitamin D deficiency, unspecified: Secondary | ICD-10-CM | POA: Diagnosis not present

## 2025-01-21 MED ORDER — EPINEPHRINE 0.3 MG/0.3ML IJ SOAJ
0.3000 mg | INTRAMUSCULAR | 1 refills | Status: AC | PRN
  Filled 2025-01-21: qty 1, 30d supply, fill #0

## 2025-01-21 MED ORDER — METOPROLOL TARTRATE 25 MG PO TABS
25.0000 mg | ORAL_TABLET | Freq: Two times a day (BID) | ORAL | 1 refills | Status: AC
  Filled 2025-01-21: qty 180, 90d supply, fill #0

## 2025-01-21 NOTE — Patient Instructions (Signed)
" °  VISIT SUMMARY: During your virtual follow-up visit, we discussed your irregular menstrual bleeding, iron  deficiency anemia, and other symptoms. We also reviewed your current medications and ordered several lab tests to assess underlying causes of your symptoms.  YOUR PLAN: IRREGULAR MENSTRUATION: You have severe irregular menstruation with heavy bleeding, large blood clots, and random bleeding episodes. This may be related to hormonal imbalances or PCOS. -Follow up with your gynecologist to discuss birth control options and potential endometrial evaluation during ablation. -We ordered CBC, CMP, TSH, A1c, vitamin D , ferritin, and B12 labs to assess for underlying causes of your symptoms.  IRON  DEFICIENCY ANEMIA: Your iron  deficiency anemia is likely due to heavy menstrual bleeding. -We ordered CBC and ferritin labs to assess your current iron  status.  VON WILLEBRAND DISEASE, TYPE 1: You have a chronic condition with a lifelong history of Von Willebrand disease. Your current symptoms of heavy bleeding are not attributed to this condition by hematology. -Continue follow-up with hematology.  PAROXYSMAL SUPRAVENTRICULAR TACHYCARDIA: Your condition is well-managed with Lopressor  25 mg twice daily. Episodes of increased heart rate occur during stress or illness. -Continue taking Lopressor  25 mg twice daily.  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 -We renewed your EpiPen  prescription.  HAIR THINNING: Your hair thinning may be related to hormonal imbalances or nutritional deficiencies. -We ordered TSH, vitamin D , ferritin, and B12 labs to assess for underlying causes.  FATIGUE: Your fatigue may be related to iron  deficiency anemia, hormonal imbalances, or nutritional deficiencies. -We ordered CBC, CMP, TSH, A1c, vitamin D , ferritin, and B12 labs to assess for underlying causes.  VITAMIN D  DEFICIENCY: Potential  deficiency contributing to fatigue and hair thinning. -We ordered a vitamin D  lab to assess your current status.  GENERAL HEALTH MAINTENANCE: You are overdue for routine health maintenance including cholesterol and diabetes screening. -We ordered a lipid panel and A1c to assess for cholesterol and diabetes risk. -We scheduled a fasting lab visit for comprehensive screening.    Contains text generated by [employer].   "

## 2025-01-29 ENCOUNTER — Other Ambulatory Visit

## 2025-02-11 ENCOUNTER — Ambulatory Visit: Admitting: Oncology

## 2025-02-11 ENCOUNTER — Other Ambulatory Visit

## 2025-07-22 ENCOUNTER — Ambulatory Visit: Admitting: Physician Assistant
